# Patient Record
Sex: Female | Born: 1960 | Race: White | Hispanic: No | Marital: Married | Smoking: Former smoker
Health system: Southern US, Community
[De-identification: ages and names within clinical notes are randomized; demographics above are authoritative.]

## PROBLEM LIST (undated history)

## (undated) DIAGNOSIS — G9332 Myalgic encephalomyelitis/chronic fatigue syndrome: Secondary | ICD-10-CM

## (undated) DIAGNOSIS — M549 Dorsalgia, unspecified: Secondary | ICD-10-CM

## (undated) DIAGNOSIS — I2510 Atherosclerotic heart disease of native coronary artery without angina pectoris: Secondary | ICD-10-CM

## (undated) DIAGNOSIS — R519 Headache, unspecified: Secondary | ICD-10-CM

## (undated) DIAGNOSIS — M7989 Other specified soft tissue disorders: Secondary | ICD-10-CM

## (undated) DIAGNOSIS — R296 Repeated falls: Secondary | ICD-10-CM

## (undated) DIAGNOSIS — E559 Vitamin D deficiency, unspecified: Secondary | ICD-10-CM

## (undated) DIAGNOSIS — D649 Anemia, unspecified: Secondary | ICD-10-CM

## (undated) DIAGNOSIS — G4730 Sleep apnea, unspecified: Secondary | ICD-10-CM

## (undated) DIAGNOSIS — R0602 Shortness of breath: Secondary | ICD-10-CM

## (undated) DIAGNOSIS — F419 Anxiety disorder, unspecified: Secondary | ICD-10-CM

## (undated) DIAGNOSIS — Z9189 Other specified personal risk factors, not elsewhere classified: Secondary | ICD-10-CM

## (undated) DIAGNOSIS — K219 Gastro-esophageal reflux disease without esophagitis: Secondary | ICD-10-CM

## (undated) DIAGNOSIS — F32A Depression, unspecified: Secondary | ICD-10-CM

## (undated) DIAGNOSIS — R079 Chest pain, unspecified: Secondary | ICD-10-CM

## (undated) DIAGNOSIS — R7303 Prediabetes: Secondary | ICD-10-CM

## (undated) DIAGNOSIS — W19XXXA Unspecified fall, initial encounter: Secondary | ICD-10-CM

## (undated) DIAGNOSIS — E7800 Pure hypercholesterolemia, unspecified: Secondary | ICD-10-CM

## (undated) DIAGNOSIS — M1990 Unspecified osteoarthritis, unspecified site: Secondary | ICD-10-CM

## (undated) DIAGNOSIS — M797 Fibromyalgia: Secondary | ICD-10-CM

## (undated) DIAGNOSIS — J302 Other seasonal allergic rhinitis: Secondary | ICD-10-CM

## (undated) DIAGNOSIS — M069 Rheumatoid arthritis, unspecified: Secondary | ICD-10-CM

## (undated) DIAGNOSIS — M2550 Pain in unspecified joint: Secondary | ICD-10-CM

## (undated) DIAGNOSIS — R0600 Dyspnea, unspecified: Secondary | ICD-10-CM

## (undated) DIAGNOSIS — E039 Hypothyroidism, unspecified: Secondary | ICD-10-CM

## (undated) HISTORY — PX: COLONOSCOPY: SHX174

## (undated) HISTORY — DX: Vitamin D deficiency, unspecified: E55.9

## (undated) HISTORY — PX: MULTIPLE TOOTH EXTRACTIONS: SHX2053

## (undated) HISTORY — DX: Chest pain, unspecified: R07.9

## (undated) HISTORY — DX: Other specified soft tissue disorders: M79.89

## (undated) HISTORY — DX: Shortness of breath: R06.02

## (undated) HISTORY — DX: Pain in unspecified joint: M25.50

## (undated) HISTORY — DX: Pure hypercholesterolemia, unspecified: E78.00

## (undated) HISTORY — PX: CARDIAC CATHETERIZATION: SHX172

## (undated) HISTORY — DX: Dorsalgia, unspecified: M54.9

## (undated) HISTORY — DX: Myalgic encephalomyelitis/chronic fatigue syndrome: G93.32

## (undated) HISTORY — DX: Rheumatoid arthritis, unspecified: M06.9

---

## 2000-11-16 HISTORY — PX: BUNIONECTOMY: SHX129

## 2015-11-17 DIAGNOSIS — J449 Chronic obstructive pulmonary disease, unspecified: Secondary | ICD-10-CM

## 2015-11-17 HISTORY — DX: Chronic obstructive pulmonary disease, unspecified: J44.9

## 2015-11-17 HISTORY — PX: LAPAROSCOPIC GASTRIC SLEEVE RESECTION: SHX5895

## 2016-01-15 DIAGNOSIS — I219 Acute myocardial infarction, unspecified: Secondary | ICD-10-CM

## 2016-01-15 HISTORY — DX: Acute myocardial infarction, unspecified: I21.9

## 2016-01-23 HISTORY — PX: CARDIAC CATHETERIZATION: SHX172

## 2019-10-18 DIAGNOSIS — M25561 Pain in right knee: Secondary | ICD-10-CM | POA: Diagnosis not present

## 2019-10-18 DIAGNOSIS — Z1331 Encounter for screening for depression: Secondary | ICD-10-CM | POA: Diagnosis not present

## 2019-10-18 DIAGNOSIS — M25562 Pain in left knee: Secondary | ICD-10-CM | POA: Diagnosis not present

## 2019-10-18 DIAGNOSIS — Z0000 Encounter for general adult medical examination without abnormal findings: Secondary | ICD-10-CM | POA: Diagnosis not present

## 2019-10-18 DIAGNOSIS — G8929 Other chronic pain: Secondary | ICD-10-CM | POA: Diagnosis not present

## 2019-10-18 DIAGNOSIS — Z1159 Encounter for screening for other viral diseases: Secondary | ICD-10-CM | POA: Diagnosis not present

## 2019-10-18 DIAGNOSIS — R0602 Shortness of breath: Secondary | ICD-10-CM | POA: Diagnosis not present

## 2019-10-18 DIAGNOSIS — E7800 Pure hypercholesterolemia, unspecified: Secondary | ICD-10-CM | POA: Diagnosis not present

## 2019-10-18 DIAGNOSIS — Z114 Encounter for screening for human immunodeficiency virus [HIV]: Secondary | ICD-10-CM | POA: Diagnosis not present

## 2019-10-18 DIAGNOSIS — Z131 Encounter for screening for diabetes mellitus: Secondary | ICD-10-CM | POA: Diagnosis not present

## 2019-10-18 DIAGNOSIS — Z1339 Encounter for screening examination for other mental health and behavioral disorders: Secondary | ICD-10-CM | POA: Diagnosis not present

## 2019-10-18 DIAGNOSIS — M129 Arthropathy, unspecified: Secondary | ICD-10-CM | POA: Diagnosis not present

## 2019-10-18 DIAGNOSIS — Z23 Encounter for immunization: Secondary | ICD-10-CM | POA: Diagnosis not present

## 2019-10-18 DIAGNOSIS — M545 Low back pain: Secondary | ICD-10-CM | POA: Diagnosis not present

## 2019-10-18 DIAGNOSIS — E559 Vitamin D deficiency, unspecified: Secondary | ICD-10-CM | POA: Diagnosis not present

## 2019-10-18 DIAGNOSIS — Z79899 Other long term (current) drug therapy: Secondary | ICD-10-CM | POA: Diagnosis not present

## 2019-10-18 DIAGNOSIS — R5383 Other fatigue: Secondary | ICD-10-CM | POA: Diagnosis not present

## 2019-10-18 DIAGNOSIS — M542 Cervicalgia: Secondary | ICD-10-CM | POA: Diagnosis not present

## 2019-10-23 ENCOUNTER — Other Ambulatory Visit: Payer: Self-pay | Admitting: Nurse Practitioner

## 2019-10-23 DIAGNOSIS — Z1231 Encounter for screening mammogram for malignant neoplasm of breast: Secondary | ICD-10-CM

## 2019-10-23 DIAGNOSIS — B379 Candidiasis, unspecified: Secondary | ICD-10-CM | POA: Diagnosis not present

## 2019-10-23 DIAGNOSIS — T3695XA Adverse effect of unspecified systemic antibiotic, initial encounter: Secondary | ICD-10-CM | POA: Diagnosis not present

## 2019-10-23 DIAGNOSIS — R300 Dysuria: Secondary | ICD-10-CM | POA: Diagnosis not present

## 2019-10-23 DIAGNOSIS — N390 Urinary tract infection, site not specified: Secondary | ICD-10-CM | POA: Diagnosis not present

## 2019-10-27 DIAGNOSIS — M542 Cervicalgia: Secondary | ICD-10-CM | POA: Diagnosis not present

## 2019-10-27 DIAGNOSIS — M797 Fibromyalgia: Secondary | ICD-10-CM | POA: Diagnosis not present

## 2019-10-27 DIAGNOSIS — Z79899 Other long term (current) drug therapy: Secondary | ICD-10-CM | POA: Diagnosis not present

## 2019-10-27 DIAGNOSIS — G629 Polyneuropathy, unspecified: Secondary | ICD-10-CM | POA: Diagnosis not present

## 2019-10-27 DIAGNOSIS — M545 Low back pain: Secondary | ICD-10-CM | POA: Diagnosis not present

## 2019-10-30 DIAGNOSIS — J449 Chronic obstructive pulmonary disease, unspecified: Secondary | ICD-10-CM | POA: Diagnosis not present

## 2019-10-30 DIAGNOSIS — Z7952 Long term (current) use of systemic steroids: Secondary | ICD-10-CM | POA: Diagnosis not present

## 2019-10-30 DIAGNOSIS — R0602 Shortness of breath: Secondary | ICD-10-CM | POA: Diagnosis not present

## 2019-11-28 DIAGNOSIS — M25562 Pain in left knee: Secondary | ICD-10-CM | POA: Insufficient documentation

## 2019-11-28 DIAGNOSIS — M25561 Pain in right knee: Secondary | ICD-10-CM | POA: Insufficient documentation

## 2019-12-12 ENCOUNTER — Ambulatory Visit
Admission: RE | Admit: 2019-12-12 | Discharge: 2019-12-12 | Disposition: A | Discharge status: Home / Self Care | Payer: Self-pay | Source: Ambulatory Visit | Attending: Nurse Practitioner | Admitting: Nurse Practitioner

## 2019-12-12 ENCOUNTER — Other Ambulatory Visit: Payer: Self-pay

## 2019-12-12 DIAGNOSIS — Z1231 Encounter for screening mammogram for malignant neoplasm of breast: Secondary | ICD-10-CM

## 2020-03-01 DIAGNOSIS — N950 Postmenopausal bleeding: Secondary | ICD-10-CM | POA: Insufficient documentation

## 2020-03-12 ENCOUNTER — Encounter (HOSPITAL_COMMUNITY): Payer: Self-pay | Admitting: *Deleted

## 2020-03-12 NOTE — Progress Notes (Signed)
Received referral from Dr. Malen Gauze at [facility]  for this pt to participate in pulmonary rehab with the the diagnosis of dyspnea. Pt seen at the [facility] for pain management - back pain and fibromyalgia.  Clinical review of pt follow up appt on 4/16 with Shaaron Adler NP  office note. Pt completed spirometry in 10/2019.  Pt with Covid Risk Score - 2. Pt appropriate for scheduling for Pulmonary rehab.  Will forward  verification of insurance eligibility/benefits and pulmonary rehab staff for scheduling with pt consent. Alanson Aly, BSN Cardiac and Emergency planning/management officer

## 2020-03-13 ENCOUNTER — Telehealth (HOSPITAL_COMMUNITY): Payer: Self-pay

## 2020-03-13 NOTE — Telephone Encounter (Signed)
Pt insurance is active and benefits verified through [place] $10, DED 0/0 met, out of pocket $3,900/$51.16 met, co-insurance 0%. no pre-authorization required. Passport, 03/13/2020'@9' :43am, REF# [id number]

## 2020-03-21 ENCOUNTER — Telehealth (HOSPITAL_COMMUNITY): Payer: Self-pay | Admitting: *Deleted

## 2020-03-27 ENCOUNTER — Telehealth (HOSPITAL_COMMUNITY): Payer: Self-pay

## 2020-04-02 NOTE — Telephone Encounter (Signed)
Returned pt phone call in regards to PR, adv pt we have receive her referral. Explained scheduling process and went over insurance, patient verbalized understanding.   Pulmonary is scheduling out until June, referral has been pass to PR staff for scheduling.

## 2020-04-03 ENCOUNTER — Emergency Department (HOSPITAL_COMMUNITY): Payer: Medicare HMO

## 2020-04-03 ENCOUNTER — Encounter (HOSPITAL_COMMUNITY): Payer: Self-pay | Admitting: Emergency Medicine

## 2020-04-03 ENCOUNTER — Other Ambulatory Visit: Payer: Self-pay

## 2020-04-03 ENCOUNTER — Emergency Department (HOSPITAL_COMMUNITY)
Admission: EM | Admit: 2020-04-03 | Discharge: 2020-04-04 | Disposition: A | Discharge status: Home / Self Care | Payer: Medicare HMO | Attending: Emergency Medicine | Admitting: Emergency Medicine

## 2020-04-03 DIAGNOSIS — R55 Syncope and collapse: Secondary | ICD-10-CM | POA: Insufficient documentation

## 2020-04-03 DIAGNOSIS — Y999 Unspecified external cause status: Secondary | ICD-10-CM | POA: Diagnosis not present

## 2020-04-03 DIAGNOSIS — W1830XA Fall on same level, unspecified, initial encounter: Secondary | ICD-10-CM | POA: Insufficient documentation

## 2020-04-03 DIAGNOSIS — Y929 Unspecified place or not applicable: Secondary | ICD-10-CM | POA: Insufficient documentation

## 2020-04-03 DIAGNOSIS — B370 Candidal stomatitis: Secondary | ICD-10-CM | POA: Diagnosis not present

## 2020-04-03 DIAGNOSIS — I2510 Atherosclerotic heart disease of native coronary artery without angina pectoris: Secondary | ICD-10-CM | POA: Insufficient documentation

## 2020-04-03 DIAGNOSIS — Y939 Activity, unspecified: Secondary | ICD-10-CM | POA: Diagnosis not present

## 2020-04-03 DIAGNOSIS — S0990XA Unspecified injury of head, initial encounter: Secondary | ICD-10-CM

## 2020-04-03 HISTORY — DX: Fibromyalgia: M79.7

## 2020-04-03 HISTORY — DX: Atherosclerotic heart disease of native coronary artery without angina pectoris: I25.10

## 2020-04-03 LAB — BASIC METABOLIC PANEL
Anion gap: 9 (ref 5–15)
BUN: 18 mg/dL (ref 8–23)
CO2: 28 mmol/L (ref 22–32)
Calcium: 9.4 mg/dL (ref 8.9–10.3)
Chloride: 102 mmol/L (ref 98–111)
Creatinine, Ser: 0.99 mg/dL (ref 0.44–1.00)
GFR calc Af Amer: >60 mL/min (ref >60)
GFR calc non Af Amer: >60 mL/min (ref >60)
Glucose, Bld: 101 mg/dL — ABNORMAL HIGH (ref 70–99)
Potassium: 3.7 mmol/L (ref 3.5–5.1)
Sodium: 139 mmol/L (ref 135–145)

## 2020-04-03 LAB — CBC
HCT: 41.6 % (ref 36.0–46.0)
Hemoglobin: 12.8 g/dL (ref 12.0–15.0)
MCH: 29.2 pg (ref 26.0–34.0)
MCHC: 30.8 g/dL (ref 30.0–36.0)
MCV: 95 fL (ref 80.0–100.0)
Platelets: 209 10*3/uL (ref 150–400)
RBC: 4.38 MIL/uL (ref 3.87–5.11)
RDW: 12.4 % (ref 11.5–15.5)
WBC: 5.5 10*3/uL (ref 4.0–10.5)
nRBC: 0 % (ref 0.0–0.2)

## 2020-04-03 LAB — TROPONIN I (HIGH SENSITIVITY): Troponin I (High Sensitivity): <2 ng/L (ref <18)

## 2020-04-03 LAB — CBG MONITORING, ED: Glucose-Capillary: 94 mg/dL (ref 70–99)

## 2020-04-03 MED ORDER — SODIUM CHLORIDE 0.9% FLUSH
3.0000 mL | Freq: Once | INTRAVENOUS | Status: AC
  Administered 2020-04-03: 3 mL via INTRAVENOUS

## 2020-04-03 MED ORDER — SODIUM CHLORIDE 0.9 % IV BOLUS
500.0000 mL | Freq: Once | INTRAVENOUS | Status: AC
  Administered 2020-04-03: 500 mL via INTRAVENOUS

## 2020-04-03 MED ORDER — NYSTATIN 100000 UNIT/ML MT SUSP: 500000.0000 [IU] | Freq: Four times a day (QID) | OROMUCOSAL | 0 refills | Status: AC

## 2020-04-03 MED ORDER — NYSTATIN 100000 UNIT/ML MT SUSP
500000.0000 [IU] | Freq: Four times a day (QID) | OROMUCOSAL | 0 refills | Status: DC
Start: 2020-04-03 — End: 2020-04-03

## 2020-04-03 NOTE — Discharge Instructions (Signed)
You have been seen today in the Emergency Department (ED)  for syncope (passing out).  Your workup including labs and EKG show reassuring results.  Your symptoms may be due to dehydration, so it is important that you drink plenty of non-alcoholic fluids.  I have called in a prescription for your thrush.  You can begin taking that as directed and follow with your primary care doctor.  Please call your regular doctor as soon as possible to schedule the next available clinic appointment to follow up with him/her regarding your visit to the ED and your symptoms.  Return to the Emergency Department (ED)  if you have any further syncopal episodes (pass out again) or develop ANY chest pain, pressure, tightness, trouble breathing, sudden sweating, or other symptoms that concern you.

## 2020-04-03 NOTE — ED Provider Notes (Signed)
Emergency Department Provider Note   I have reviewed the triage vital signs and the nursing notes.   HISTORY  Chief Complaint Loss of Consciousness   HPI Angela Mahoney is a 59 y.o. female with PMH of CAD, hypotension, syncope, Fibromyalgia, and prior falls presents to the emergency department for evaluation after fall with an apparent syncopal event.  Patient states that she was feeling fine today and got up to make her husband a sandwich.  She made it to the refrigerator without feeling lightheaded, open the door, and suddenly felt very lightheaded.  She did not experience chest pain or heart palpitations.  She does not remember falling but awoke on the floor.  The patient's husband, at bedside, states that he heard a fall and went in to find the patient awake and alert but lying on the ground.  He placed a pillow under her head because she was complaining of some headache and called EMS after speaking with her daughter.   Patient denies any pain in the arms or legs.  She tells me this is happened to her multiple times before.  She recently completed her Covid vaccine series and felt flulike symptoms for several days afterwards.  Shortly after that she developed thrush which she has been treating with over-the-counter medications.  Because of this, she has not been eating or drinking as much as she typically does and thinks she may be dehydrated.  She not experiencing fevers or shaking chills. No radiation of symptoms or modifying factors. EMS report BP in the 90s systolic and gave IVF en route (500 mL) with improvement in pressures.   She does state that she been feeling somewhat short of breath recently but not worse since falling.  She denies any wheezing.  She states she does feel short of breath from time to time and has COPD history.   Past Medical History:  Diagnosis Date  . Coronary artery disease   . Fibromyalgia     There are no problems to display for this  patient.   History reviewed. No pertinent surgical history.  Allergies Zolpidem  History reviewed. No pertinent family history.  Social History Social History   Tobacco Use  . Smoking status: Never Smoker  . Smokeless tobacco: Never Used  Substance Use Topics  . Alcohol use: Never  . Drug use: Never    Review of Systems  Constitutional: No fever/chills. Eyes: No visual changes. ENT: No sore throat. Positive thrush.  Cardiovascular: Denies chest pain. Positive syncope and fall.  Respiratory: Positive shortness of breath. Gastrointestinal: No abdominal pain.  No nausea, no vomiting.  No diarrhea.  No constipation. Genitourinary: Negative for dysuria. Musculoskeletal: Negative for back pain. Skin: Negative for rash. Neurological: Negative for focal weakness or numbness. Positive HA.   10-point ROS otherwise negative.  ____________________________________________   PHYSICAL EXAM:  VITAL SIGNS: ED Triage Vitals  Enc Vitals Group     BP 04/03/20 2200 (!) 110/97     Pulse Rate 04/03/20 2200 65     Resp 04/03/20 2200 17     Temp 04/03/20 2200 98.3 F (36.8 C)     Temp Source 04/03/20 2200 Oral     SpO2 04/03/20 2200 99 %     Weight 04/03/20 2156 223 lb (101.2 kg)     Height 04/03/20 2156 5\' 4"  (1.626 m)   Constitutional: Alert and oriented. Well appearing and in no acute distress. Eyes: Conjunctivae are normal. PERRL.  Head: 2 cm occipital scalp hematoma  without laceration.  Nose: No congestion/rhinnorhea. Mouth/Throat: Mucous membranes are moist.  Oropharynx non-erythematous. Mild oral thrush noted.  Neck: No stridor. C collar in place. No midline tenderness.  Cardiovascular: Normal rate, regular rhythm. Good peripheral circulation. Grossly normal heart sounds.   Respiratory: Normal respiratory effort.  No retractions. Lungs CTAB. Gastrointestinal: Soft and nontender. No distention.  Musculoskeletal: No lower extremity tenderness nor edema. No gross deformities  of extremities.  Normal active and passive range of motion of the bilateral upper and lower extremities.  Neurologic:  Normal speech and language. No gross focal neurologic deficits are appreciated.  No facial asymmetry.  Equal grips bilaterally.  Normal strength in the bilateral upper and lower extremities.  Skin:  Skin is warm, dry and intact. No rash noted.   ____________________________________________   LABS (all labs ordered are listed, but only abnormal results are displayed)  Labs Reviewed  BASIC METABOLIC PANEL - Abnormal; Notable for the following components:      Result Value   Glucose, Bld 101 (*)    All other components within normal limits  CBC  CBG MONITORING, ED  TROPONIN I (HIGH SENSITIVITY)  TROPONIN I (HIGH SENSITIVITY)   ____________________________________________  EKG  Rate: 63 PR: 203 QTc: 438  Sinus rhythm. Narrow QRS. Normal T waves. No STEMI.  ____________________________________________  RADIOLOGY  CT Head Wo Contrast  Result Date: 04/03/2020 CLINICAL DATA:  Syncopal episode with subsequent fall. EXAM: CT HEAD WITHOUT CONTRAST TECHNIQUE: Contiguous axial images were obtained from the base of the skull through the vertex without intravenous contrast. COMPARISON:  None. FINDINGS: Brain: No evidence of acute infarction, hemorrhage, hydrocephalus, extra-axial collection or mass lesion/mass effect. Vascular: No hyperdense vessel or unexpected calcification. Skull: Normal. Negative for fracture or focal lesion. Sinuses/Orbits: No acute finding. Other: None. IMPRESSION: No acute intracranial pathology. Electronically Signed   By: Aram Candela M.D.   On: 04/03/2020 22:50   CT Cervical Spine Wo Contrast  Result Date: 04/03/2020 CLINICAL DATA:  Syncopal episode with subsequent fall. EXAM: CT CERVICAL SPINE WITHOUT CONTRAST TECHNIQUE: Multidetector CT imaging of the cervical spine was performed without intravenous contrast. Multiplanar CT image  reconstructions were also generated. COMPARISON:  None. FINDINGS: Alignment: There is approximately 2 mm anterolisthesis of the C4 vertebral body on C5. Skull base and vertebrae: No acute fracture. No primary bone lesion or focal pathologic process. Soft tissues and spinal canal: No prevertebral fluid or swelling. No visible canal hematoma. Disc levels: Moderate severity endplate sclerosis is seen at the levels of C5-C6, C6-C7 and C7-T1. Mild intervertebral disc space narrowing is also seen at these levels. Marked severity bilateral multilevel facet joint hypertrophy is seen. Upper chest: Negative. Other: None. IMPRESSION: 1. No acute osseous abnormality. 2. Marked severity bilateral multilevel facet joint hypertrophy. 3. 2 mm anterolisthesis of the C4 vertebral body on C5. Electronically Signed   By: Aram Candela M.D.   On: 04/03/2020 22:51   DG Chest Portable 1 View  Result Date: 04/03/2020 CLINICAL DATA:  Syncope EXAM: PORTABLE CHEST 1 VIEW COMPARISON:  None. FINDINGS: The heart size and mediastinal contours are within normal limits. Both lungs are clear. The visualized skeletal structures are unremarkable. IMPRESSION: No active disease. Electronically Signed   By: Sharlet Salina M.D.   On: 04/03/2020 23:30    ____________________________________________   PROCEDURES  Procedure(s) performed:   Procedures  None ____________________________________________   INITIAL IMPRESSION / ASSESSMENT AND PLAN / ED COURSE  Pertinent labs & imaging results that were available during my care of  the patient were reviewed by me and considered in my medical decision making (see chart for details).   Patient presents emergency department evaluation of syncope today.  She has had multiple episodes like this in the past.  Blood pressure low on scene but improved with IV fluids.  Patient had flulike symptoms after Covid vaccine and then developed thrush and so p.o. intake has been decreased.  She is  well-appearing with normal vitals after IV fluids.  No fever.  Normal neurologic exam.  She is very small hematoma to the posterior scalp without laceration.  Some pain with range of motion of her neck on scene with EMS so c-collar in place but no midline tenderness on exam.  Plan for additional IV fluids.  Given the ACS history will obtain screening blood work along with troponin.  EKG is reassuring.  Plan for CT imaging of the head and cervical spine.  Patient with COPD and having some mild shortness of breath but no acute distress.  Will obtain screening chest x-ray.   Patient signed out to Dr. Randal Buba pending repeat troponin.  ____________________________________________  FINAL CLINICAL IMPRESSION(S) / ED DIAGNOSES  Final diagnoses:  Syncope and collapse  Injury of head, initial encounter  Thrush, oral     MEDICATIONS GIVEN DURING THIS VISIT:  Medications  sodium chloride flush (NS) 0.9 % injection 3 mL (3 mLs Intravenous Given 04/03/20 2210)  sodium chloride 0.9 % bolus 500 mL (0 mLs Intravenous Stopped 04/04/20 0130)     Note:  This document was prepared using Dragon voice recognition software and may include unintentional dictation errors.  Nanda Quinton, MD, [facility] Emergency Medicine    Marshall Kampf, Wonda Old[facility], MD 04/04/20 [phone]

## 2020-04-03 NOTE — ED Triage Notes (Signed)
Pt brought to ED by [facility] from home after pt had a syncope episode at home fell and hit her head. c collar in place by EMS, pt states her normal BP is 90/60 on EMS arrival pt's BP 120/80 while lying down and 90/60 while sitting up. 500 mL NS bolus given by EMS pta. BP 114/58, HR 60, R 18, SPO2 98% RA.

## 2020-04-04 LAB — TROPONIN I (HIGH SENSITIVITY): Troponin I (High Sensitivity): <2 ng/L (ref <18)

## 2020-04-04 NOTE — ED Notes (Signed)
Pt verbalized understanding of discharge instructions. Follow up care and prescriptions reviewed, pt had no further questions. 

## 2020-04-10 ENCOUNTER — Telehealth (HOSPITAL_COMMUNITY): Payer: Self-pay

## 2020-04-22 ENCOUNTER — Ambulatory Visit (HOSPITAL_COMMUNITY): Payer: Medicare HMO

## 2020-04-22 ENCOUNTER — Telehealth (HOSPITAL_COMMUNITY): Payer: Self-pay | Admitting: *Deleted

## 2020-04-22 NOTE — Telephone Encounter (Signed)
Pt called to confirm her appt with pulmonary rehab staff for orientation appt on this coming Wednesday.  Advised pt that she does have an appt on June 9th at 9:00. Aware that she will need to arrive by 8:45.  Reviewed our location and directions.  Pt thanked me for the call. Alanson Aly, BSN Cardiac and Emergency planning/management officer

## 2020-04-23 ENCOUNTER — Telehealth (HOSPITAL_COMMUNITY): Payer: Self-pay | Admitting: *Deleted

## 2020-04-23 NOTE — Telephone Encounter (Signed)
Called to remind patient of appointment in pulmonary rehab 04/24/20 @ 0900 for orientation/walk test.  Patient confirmed.

## 2020-04-24 ENCOUNTER — Other Ambulatory Visit: Payer: Self-pay

## 2020-04-24 ENCOUNTER — Encounter (HOSPITAL_COMMUNITY)
Admission: RE | Admit: 2020-04-24 | Discharge: 2020-04-24 | Disposition: A | Discharge status: Home / Self Care | Payer: Medicare HMO | Source: Ambulatory Visit | Attending: Cardiology | Admitting: Cardiology

## 2020-04-24 ENCOUNTER — Encounter (HOSPITAL_COMMUNITY): Payer: Self-pay

## 2020-04-24 VITALS — BP 104/70 | HR 74 | Ht 64.0 in | Wt 222.9 lb

## 2020-04-24 DIAGNOSIS — R0600 Dyspnea, unspecified: Secondary | ICD-10-CM | POA: Insufficient documentation

## 2020-04-24 NOTE — Progress Notes (Signed)
Angela Mahoney 59 y.o. female Pulmonary Rehab Orientation Note Patient arrived today in Cardiac and Pulmonary Rehab for orientation to Pulmonary Rehab. She walked from the [facility] parking deck with moderate shortness of breath. She does not carry portable oxygen. Per pt, she uses oxygen never, but wears a CPAP @ night.  Color good, skin warm and dry. Patient is oriented to time and place. Patient's medical history, psychosocial health, and medications reviewed. Psychosocial assessment reveals pt lives with their spouse. Pt is currently unemployed.  Pt does exhibit signs of depression and is currently utilizing a therapist every 10 days, sees a psychiatrist monthly and is on medications for depression.  Ambilify was just added to help with insomnia. PHQ2/9 score 4/13. Pt shows fair  coping skills with positive outlook .  offered emotional support and reassurance. Will continue to monitor and evaluate progress toward psychosocial goal(s) of improved mental well being while participating in pulmonary rehab. Physical assessment reveals heart rate is normal, breath sounds clear to auscultation, no wheezes, rales, or rhonchi. Grip strength equal, strong. Patient reports she does take medications as prescribed. Patient states she follows a Regular diet. She had gastric sleeve surgery in 2018 and has lost 70 pounds since then.  She eats 6 small meals throughout the day to accomodate her small stomach and takes vitamin supplement as well... Patient's weight will be monitored closely. Demonstration and practice of PLB using pulse oximeter. Patient able to return demonstration satisfactorily. Safety and hand hygiene in the exercise area reviewed with patient. Patient voices understanding of the information reviewed. Department expectations discussed with patient and achievable goals were set. The patient shows enthusiasm about attending the program and we look forward to working with this nice lady. The patient did not  complete the 6 minute walk test today due to wearing flip flops and she has a history of falls.  This has been rescheduled for 04/30/2020 @ 1015 and the she will begin exercising on 05/02/2020 in the 1015 exercise slot. [id number]

## 2020-04-30 ENCOUNTER — Encounter (HOSPITAL_COMMUNITY)
Admission: RE | Admit: 2020-04-30 | Discharge: 2020-04-30 | Disposition: A | Discharge status: Home / Self Care | Payer: Medicare HMO | Source: Ambulatory Visit | Attending: Cardiology | Admitting: Cardiology

## 2020-04-30 ENCOUNTER — Other Ambulatory Visit: Payer: Self-pay

## 2020-04-30 DIAGNOSIS — R0600 Dyspnea, unspecified: Secondary | ICD-10-CM | POA: Diagnosis not present

## 2020-04-30 NOTE — Progress Notes (Signed)
Pulmonary Individual Treatment Plan  Patient Details  Name: Angela Mahoney MRN: [id number] Date of Birth: May 31, 1961 Referring Provider:     Pulmonary Rehab Walk Test from 04/30/2020 in [place]  Referring Provider Dr. Royce Macadamia      Initial Encounter Date:    Pulmonary Rehab Walk Test from 04/30/2020 in [place]  Date 04/30/20      Visit Diagnosis: Dyspnea, unspecified type  Patient's Home Medications on Admission:   Current Outpatient Medications:  .  acetaminophen (TYLENOL) 325 MG tablet, Take 650 mg by mouth 2 (two) times daily as needed for mild pain or headache., Disp: , Rfl:  .  albuterol (VENTOLIN HFA) 108 (90 Base) MCG/ACT inhaler, Inhale 1-2 puffs into the lungs every 6 (six) hours as needed for shortness of breath or wheezing., Disp: , Rfl:  .  ARIPiprazole (ABILIFY) 5 MG tablet, Take 5 mg by mouth every morning., Disp: , Rfl:  .  ascorbic acid (VITAMIN C) 500 MG tablet, Take 500 mg by mouth daily., Disp: , Rfl:  .  aspirin EC 81 MG tablet, Take 81 mg by mouth daily., Disp: , Rfl:  .  atorvastatin (LIPITOR) 80 MG tablet, Take 1 tablet by mouth daily., Disp: , Rfl:  .  baclofen (LIORESAL) 10 MG tablet, Take 1 tablet by mouth daily., Disp: , Rfl:  .  busPIRone (BUSPAR) 30 MG tablet, Take 1 tablet by mouth 3 (three) times daily., Disp: , Rfl:  .  Calcium Citrate-Vitamin D (CALCIUM CITRATE + D PO), Take 1 tablet by mouth daily., Disp: , Rfl:  .  DULoxetine (CYMBALTA) 60 MG capsule, Take 2 capsules by mouth at bedtime., Disp: , Rfl:  .  ergocalciferol (VITAMIN D2) 1.25 MG (50000 UT) capsule, Take 50,000 Units by mouth every Sunday., Disp: , Rfl:  .  famotidine (PEPCID) 20 MG tablet, Take 1 tablet by mouth daily., Disp: , Rfl:  .  ferrous sulfate 325 (65 FE) MG tablet, Take 325 mg by mouth 2 (two) times a week., Disp: , Rfl:  .  fluticasone (FLONASE) 50 MCG/ACT nasal spray, Place 1 spray into both nostrils  daily as needed for allergies or rhinitis., Disp: , Rfl:  .  folic acid (FOLVITE) 568 MCG tablet, Take 800 mcg by mouth daily., Disp: , Rfl:  .  gabapentin (NEURONTIN) 300 MG capsule, Take 600-900 mg by mouth See admin instructions. Take 2 capsules in the morning and dinner time, then take 3 capsules at bedtime, Disp: , Rfl:  .  levothyroxine (SYNTHROID) 25 MCG tablet, Take 1 tablet by mouth daily., Disp: , Rfl:  .  metoprolol tartrate (LOPRESSOR) 50 MG tablet, Take 25 mg by mouth daily., Disp: , Rfl:  .  Multiple Vitamin (MULTIVITAMIN WITH MINERALS) TABS tablet, Take 1 tablet by mouth daily., Disp: , Rfl:  .  NARCAN 4 MG/0.1ML LIQD nasal spray kit, Place 1 spray into the nose as needed for opioid reversal., Disp: , Rfl:  .  nitroGLYCERIN (NITROSTAT) 0.4 MG SL tablet, Place 0.4 mg under the tongue every 5 (five) minutes as needed for chest pain., Disp: , Rfl:  .  ondansetron (ZOFRAN-ODT) 4 MG disintegrating tablet, Take 1 tablet by mouth every 8 (eight) hours as needed for nausea/vomiting., Disp: , Rfl:  .  ranolazine (RANEXA) 500 MG 12 hr tablet, Take 1 tablet by mouth daily., Disp: , Rfl:   Past Medical History: Past Medical History:  Diagnosis Date  . COPD (chronic obstructive pulmonary  disease) ([place]) 2017  . Coronary artery disease   . Fibromyalgia     Tobacco Use: Social History   Tobacco Use  Smoking Status Former Smoker  . Packs/day: 0.50  . Years: 45.00  . Pack years: 22.50  . Types: Cigarettes  Smokeless Tobacco Never Used    Labs: Recent Review Flowsheet Data   There is no flowsheet data to display.     Capillary Blood Glucose: Lab Results  Component Value Date   GLUCAP 94 04/03/2020     Pulmonary Assessment Scores:  Pulmonary Assessment Scores    Row Name 04/24/20 1258         ADL UCSD   ADL Phase Entry     SOB Score total 73       CAT Score   CAT Score 24       mMRC Score   mMRC Score 4           UCSD: Self-administered rating of dyspnea  associated with activities of daily living (ADLs) 6-point scale (0 = "not at all" to 5 = "maximal or unable to do because of breathlessness")  Scoring Scores range from 0 to 120.  Minimally important difference is 5 units  CAT: CAT can identify the health impairment of COPD patients and is better correlated with disease progression.  CAT has a scoring range of zero to 40. The CAT score is classified into four groups of low (less than 10), medium (10 - 20), high (21-30) and very high (31-40) based on the impact level of disease on health status. A CAT score over 10 suggests significant symptoms.  A worsening CAT score could be explained by an exacerbation, poor medication adherence, poor inhaler technique, or progression of COPD or comorbid conditions.  CAT MCID is 2 points  mMRC: mMRC (Modified Medical Research Council) Dyspnea Scale is used to assess the degree of baseline functional disability in patients of respiratory disease due to dyspnea. No minimal important difference is established. A decrease in score of 1 point or greater is considered a positive change.   Pulmonary Function Assessment:  Pulmonary Function Assessment - 04/24/20 1259      Breath   Bilateral Breath Sounds Clear    Shortness of Breath Limiting activity;Yes           Exercise Target Goals: Exercise Program Goal: Individual exercise prescription set using results from initial 6 min walk test and THRR while considering  patient's activity barriers and safety.   Exercise Prescription Goal: Initial exercise prescription builds to 30-45 minutes a day of aerobic activity, 2-3 days per week.  Home exercise guidelines will be given to patient during program as part of exercise prescription that the participant will acknowledge.  Activity Barriers & Risk Stratification:  Activity Barriers & Cardiac Risk Stratification - 04/24/20 0941      Activity Barriers & Cardiac Risk Stratification   Activity Barriers  Fibromyalgia;History of Falls;Muscular [employer] Device;Shortness of Breath;Deconditioning           6 Minute Walk:  6 Minute Walk    Row Name 04/30/20 1211         6 Minute Walk   Phase Initial     Distance 827 feet     Walk Time 6 minutes     # of Rest Breaks 0     MPH 1.57     METS 1.78     RPE 13     Perceived Dyspnea  2     VO2  Peak 6.23     Symptoms Yes (comment)     Comments Right hip and knee pain 9/10     Resting HR 88 bpm     Resting BP 108/60     Resting Oxygen Saturation  97 %     Exercise Oxygen Saturation  during 6 min walk 94 %     Max Ex. HR 94 bpm     Max Ex. BP 116/64     2 Minute Post BP 98/62       Interval HR   1 Minute HR 88     2 Minute HR 94     3 Minute HR 94     4 Minute HR 93     5 Minute HR 89     6 Minute HR 94     2 Minute Post HR 80     Interval Heart Rate? Yes       Interval Oxygen   Interval Oxygen? Yes     Baseline Oxygen Saturation % 97 %     1 Minute Oxygen Saturation % 97 %     1 Minute Liters of Oxygen 0 L     2 Minute Oxygen Saturation % 94 %     2 Minute Liters of Oxygen 0 L     3 Minute Oxygen Saturation % 97 %     3 Minute Liters of Oxygen 0 L     4 Minute Oxygen Saturation % 95 %     4 Minute Liters of Oxygen 0 L     5 Minute Oxygen Saturation % 97 %     5 Minute Liters of Oxygen 0 L     6 Minute Oxygen Saturation % 96 %     6 Minute Liters of Oxygen 0 L     2 Minute Post Oxygen Saturation % 98 %     2 Minute Post Liters of Oxygen 0 L            Oxygen Initial Assessment:  Oxygen Initial Assessment - 04/30/20 1204      Home Oxygen   Home Oxygen Device None    Sleep Oxygen Prescription CPAP    Liters per minute 0    Home Exercise Oxygen Prescription None    Home at Rest Exercise Oxygen Prescription None    Compliance with Home Oxygen Use Yes      Initial 6 min Walk   Oxygen Used None      Program Oxygen Prescription   Program Oxygen Prescription None      Intervention   Short Term  Goals To learn and exhibit compliance with exercise, home and travel O2 prescription;To learn and understand importance of monitoring SPO2 with pulse oximeter and demonstrate accurate use of the pulse oximeter.;To learn and understand importance of maintaining oxygen saturations>88%;To learn and demonstrate proper pursed lip breathing techniques or other breathing techniques.;To learn and demonstrate proper use of respiratory medications    Long  Term Goals Exhibits compliance with exercise, home and travel O2 prescription;Verbalizes importance of monitoring SPO2 with pulse oximeter and return demonstration;Maintenance of O2 saturations>88%;Exhibits proper breathing techniques, such as pursed lip breathing or other method taught during program session;Compliance with respiratory medication;Demonstrates proper use of MDI's           Oxygen Re-Evaluation:   Oxygen Discharge (Final Oxygen Re-Evaluation):   Initial Exercise Prescription:  Initial Exercise Prescription - 04/30/20 1200      Date of Initial Exercise RX  and Referring Provider   Date 04/30/20    Referring Provider Dr. Royce Macadamia      NuStep   Level 2    SPM 80    Minutes 15      Arm Ergometer   Level 1    RPM 60    Minutes 15      Prescription Details   Frequency (times per week) 2    Duration Progress to 30 minutes of continuous aerobic without signs/symptoms of physical distress      Intensity   THRR 40-80% of Max Heartrate 64-127    Ratings of Perceived Exertion 11-13    Perceived Dyspnea 0-4      Resistance Training   Training Prescription Yes    Weight orange band    Reps 10-15           Perform Capillary Blood Glucose checks as needed.  Exercise Prescription Changes:   Exercise Comments:   Exercise Goals and Review:  Exercise Goals    Row Name 04/30/20 1216             Exercise Goals   Increase Physical Activity Yes       Intervention Provide advice, education, support and counseling about  physical activity/exercise needs.;Develop an individualized exercise prescription for aerobic and resistive training based on initial evaluation findings, risk stratification, comorbidities and participant's personal goals.       Expected Outcomes Short Term: Attend rehab on a regular basis to increase amount of physical activity.;Long Term: Add in home exercise to make exercise part of routine and to increase amount of physical activity.;Long Term: Exercising regularly at least 3-5 days a week.       Increase Strength and Stamina Yes       Intervention Provide advice, education, support and counseling about physical activity/exercise needs.;Develop an individualized exercise prescription for aerobic and resistive training based on initial evaluation findings, risk stratification, comorbidities and participant's personal goals.       Expected Outcomes Short Term: Increase workloads from initial exercise prescription for resistance, speed, and METs.;Short Term: Perform resistance training exercises routinely during rehab and add in resistance training at home;Long Term: Improve cardiorespiratory fitness, muscular endurance and strength as measured by increased METs and functional capacity (6MWT)       Able to understand and use rate of perceived exertion (RPE) scale Yes       Intervention Provide education and explanation on how to use RPE scale       Expected Outcomes Short Term: Able to use RPE daily in rehab to express subjective intensity level;Long Term:  Able to use RPE to guide intensity level when exercising independently       Able to understand and use Dyspnea scale Yes       Intervention Provide education and explanation on how to use Dyspnea scale       Expected Outcomes Short Term: Able to use Dyspnea scale daily in rehab to express subjective sense of shortness of breath during exertion;Long Term: Able to use Dyspnea scale to guide intensity level when exercising independently       Knowledge  and understanding of Target Heart Rate Range (THRR) Yes       Intervention Provide education and explanation of THRR including how the numbers were predicted and where they are located for reference       Expected Outcomes Short Term: Able to state/look up THRR;Short Term: Able to use daily as guideline for intensity in rehab;Long Term: Able  to use THRR to govern intensity when exercising independently       Understanding of Exercise Prescription Yes       Intervention Provide education, explanation, and written materials on patient's individual exercise prescription       Expected Outcomes Short Term: Able to explain program exercise prescription;Long Term: Able to explain home exercise prescription to exercise independently              Exercise Goals Re-Evaluation :   Discharge Exercise Prescription (Final Exercise Prescription Changes):   Nutrition:  Target Goals: Understanding of nutrition guidelines, daily intake of sodium <158m, cholesterol <2015m calories 30% from fat and 7% or less from saturated fats, daily to have 5 or more servings of fruits and vegetables.  Biometrics:  Pre Biometrics - 04/24/20 0942      Pre Biometrics   Grip Strength 12 kg            Nutrition Therapy Plan and Nutrition Goals:   Nutrition Assessments:   Nutrition Goals Re-Evaluation:   Nutrition Goals Discharge (Final Nutrition Goals Re-Evaluation):   Psychosocial: Target Goals: Acknowledge presence or absence of significant depression and/or stress, maximize coping skills, provide positive support system. Participant is able to verbalize types and ability to use techniques and skills needed for reducing stress and depression.  Initial Review & Psychosocial Screening:  Initial Psych Review & Screening - 04/24/20 1300      Initial Review   Current issues with Current Depression;History of Depression;Current Anxiety/Panic;Current Psychotropic Meds;Current Sleep Concerns   Is currently  seeing a therapist every 10 days, a psychiatrist monthly, was just started on ambilify to help with her insomnia     FaGalaxYes      Barriers   Psychosocial barriers to participate in program The patient should benefit from training in stress management and relaxation.      Screening Interventions   Interventions Encouraged to exercise    Expected Outcomes Short Term goal: Utilizing psychosocial counselor, staff and physician to assist with identification of specific Stressors or current issues interfering with healing process. Setting desired goal for each stressor or current issue identified.;Long Term Goal: Stressors or current issues are controlled or eliminated.;Short Term goal: Identification and review with participant of any Quality of Life or Depression concerns found by scoring the questionnaire.;Long Term goal: The participant improves quality of Life and PHQ9 Scores as seen by post scores and/or verbalization of changes           Quality of Life Scores:  Scores of 19 and below usually indicate a poorer quality of life in these areas.  A difference of  2-3 points is a clinically meaningful difference.  A difference of 2-3 points in the total score of the Quality of Life Index has been associated with significant improvement in overall quality of life, self-image, physical symptoms, and general health in studies assessing change in quality of life.  PHQ-9: Recent Review Flowsheet Data    [facility] 04/24/2020   Decreased Interest 3   Down, Depressed, Hopeless 1   PHQ - 2 Score 4   Altered sleeping 3   Tired, decreased energy 3   Change in appetite 3   Feeling bad or failure about yourself  1   Trouble concentrating 3   Moving slowly or fidgety/restless 0   Suicidal thoughts 0   Difficult doing work/chores Not difficult at all     Interpretation of Total Score  Total Score Depression Severity:  1-4 = Minimal depression, 5-9 =  Mild depression, 10-14 = Moderate depression, 15-19 = Moderately severe depression, 20-27 = Severe depression   Psychosocial Evaluation and Intervention:  Psychosocial Evaluation - 04/24/20 1303      Psychosocial Evaluation & Interventions   Interventions Stress management education;Encouraged to exercise with the program and follow exercise prescription;Relaxation education    Comments Current therapy, medications are keeping her depression stable    Expected Outcomes For patient to continue with mental well-being while participating in pulmonary rehab.    Continue Psychosocial Services  No Follow up required           Psychosocial Re-Evaluation:   Psychosocial Discharge (Final Psychosocial Re-Evaluation):   Education: Education Goals: Education classes will be provided on a weekly basis, covering required topics. Participant will state understanding/return demonstration of topics presented.  Learning Barriers/Preferences:  Learning Barriers/Preferences - 04/24/20 1305      Learning Barriers/Preferences   Learning Barriers None    Learning Preferences Audio;Computer/Internet;Group Instruction;Individual Instruction;Pictoral;Skilled Demonstration;Verbal Instruction;Video;Written Material           Education Topics: Risk Factor Reduction:  -Group instruction that is supported by a PowerPoint presentation. Instructor discusses the definition of a risk factor, different risk factors for pulmonary disease, and how the heart and lungs work together.     Nutrition for Pulmonary Patient:  -Group instruction provided by PowerPoint slides, verbal discussion, and written materials to support subject matter. The instructor gives an explanation and review of healthy diet recommendations, which includes a discussion on weight management, recommendations for fruit and vegetable consumption, as well as protein, fluid, caffeine, fiber, sodium, sugar, and alcohol. Tips for eating when  patients are short of breath are discussed.   Pursed Lip Breathing:  -Group instruction that is supported by demonstration and informational handouts. Instructor discusses the benefits of pursed lip and diaphragmatic breathing and detailed demonstration on how to preform both.     Oxygen Safety:  -Group instruction provided by PowerPoint, verbal discussion, and written material to support subject matter. There is an overview of "What is Oxygen" and "Why do we need it".  Instructor also reviews how to create a safe environment for oxygen use, the importance of using oxygen as prescribed, and the risks of noncompliance. There is a brief discussion on traveling with oxygen and resources the patient may utilize.   Oxygen Equipment:  -Group instruction provided by [facility] Staff utilizing handouts, written materials, and equipment demonstrations.   Signs and Symptoms:  -Group instruction provided by written material and verbal discussion to support subject matter. Warning signs and symptoms of infection, stroke, and heart attack are reviewed and when to call the physician/911 reinforced. Tips for preventing the spread of infection discussed.   Advanced Directives:  -Group instruction provided by verbal instruction and written material to support subject matter. Instructor reviews Advanced Directive laws and proper instruction for filling out document.   Pulmonary Video:  -Group video education that reviews the importance of medication and oxygen compliance, exercise, good nutrition, pulmonary hygiene, and pursed lip and diaphragmatic breathing for the pulmonary patient.   Exercise for the Pulmonary Patient:  -Group instruction that is supported by a PowerPoint presentation. Instructor discusses benefits of exercise, core components of exercise, frequency, duration, and intensity of an exercise routine, importance of utilizing pulse oximetry during exercise, safety while exercising, and  options of places to exercise outside of rehab.     Pulmonary Medications:  -Verbally interactive group education provided  by instructor with focus on inhaled medications and proper administration.   Anatomy and Physiology of the Respiratory System and Intimacy:  -Group instruction provided by PowerPoint, verbal discussion, and written material to support subject matter. Instructor reviews respiratory cycle and anatomical components of the respiratory system and their functions. Instructor also reviews differences in obstructive and restrictive respiratory diseases with examples of each. Intimacy, Sex, and Sexuality differences are reviewed with a discussion on how relationships can change when diagnosed with pulmonary disease. Common sexual concerns are reviewed.   MD DAY -A group question and answer session with a medical doctor that allows participants to ask questions that relate to their pulmonary disease state.   OTHER EDUCATION -Group or individual verbal, written, or video instructions that support the educational goals of the pulmonary rehab program.   Holiday Eating Survival Tips:  -Group instruction provided by PowerPoint slides, verbal discussion, and written materials to support subject matter. The instructor gives patients tips, tricks, and techniques to help them not only survive but enjoy the holidays despite the onslaught of food that accompanies the holidays.   Knowledge Questionnaire Score:  Knowledge Questionnaire Score - 04/24/20 1258      Knowledge Questionnaire Score   Pre Score 9/18           Core Components/Risk Factors/Patient Goals at Admission:  Personal Goals and Risk Factors at Admission - 04/24/20 1305      Core Components/Risk Factors/Patient Goals on Admission   Improve shortness of breath with ADL's Yes    Intervention Provide education, individualized exercise plan and daily activity instruction to help decrease symptoms of SOB with activities  of daily living.    Expected Outcomes Short Term: Improve cardiorespiratory fitness to achieve a reduction of symptoms when performing ADLs;Long Term: Be able to perform more ADLs without symptoms or delay the onset of symptoms           Core Components/Risk Factors/Patient Goals Review:   Goals and Risk Factor Review    Row Name 04/24/20 1305             Core Components/Risk Factors/Patient Goals Review   Personal Goals Review Increase knowledge of respiratory medications and ability to use respiratory devices properly.;Improve shortness of breath with ADL's;Develop more efficient breathing techniques such as purse lipped breathing and diaphragmatic breathing and practicing self-pacing with activity.              Core Components/Risk Factors/Patient Goals at Discharge (Final Review):   Goals and Risk Factor Review - 04/24/20 1305      Core Components/Risk Factors/Patient Goals Review   Personal Goals Review Increase knowledge of respiratory medications and ability to use respiratory devices properly.;Improve shortness of breath with ADL's;Develop more efficient breathing techniques such as purse lipped breathing and diaphragmatic breathing and practicing self-pacing with activity.           ITP Comments:   Comments:

## 2020-05-02 ENCOUNTER — Other Ambulatory Visit: Payer: Self-pay

## 2020-05-02 ENCOUNTER — Encounter (HOSPITAL_COMMUNITY)
Admission: RE | Admit: 2020-05-02 | Discharge: 2020-05-02 | Disposition: A | Discharge status: Home / Self Care | Payer: Medicare HMO | Source: Ambulatory Visit | Attending: Cardiology | Admitting: Cardiology

## 2020-05-02 DIAGNOSIS — R0600 Dyspnea, unspecified: Secondary | ICD-10-CM

## 2020-05-02 NOTE — Progress Notes (Signed)
Daily Session Note  Patient Details  Name: Angela Mahoney MRN: [id number] Date of Birth: [birth year] Referring Provider:     Pulmonary Rehab Walk Test from 04/30/2020 in [place]  Referring Provider Dr. Royce Macadamia      Encounter Date: 05/02/2020  Check In:  Session Check In - 05/02/20 1005      Check-In   Supervising physician immediately available to respond to emergencies [facility] immediately available    Physician(s) Dr. Coralee Pesa    Location [facility]    Staff Present Rosebud Poles, RN, Roque Cash, RN;Other    Virtual Visit No    Medication changes reported     No    Fall or balance concerns reported    No    Tobacco Cessation No Change    Warm-up and Cool-down Performed on first and last piece of equipment    Resistance Training Performed Yes    VAD Patient? No    PAD/SET Patient? No      Pain Assessment   Currently in Pain? No/denies    Multiple Pain Sites No           Capillary Blood Glucose: No results found for this or any previous visit (from the past 24 hour(s)).    Social History   Tobacco Use  Smoking Status Former Smoker  . Packs/day: 0.50  . Years: 45.00  . Pack years: 22.50  . Types: Cigarettes  Smokeless Tobacco Never Used    Goals Met:  Exercise tolerated well No report of cardiac concerns or symptoms Strength training completed today  Goals Unmet:  Not Applicable  Comments: Service time is from 1000 to 1115    Dr. Fransico Him is Medical Director for Cardiac Rehab at [facility]

## 2020-05-07 ENCOUNTER — Other Ambulatory Visit: Payer: Self-pay

## 2020-05-07 ENCOUNTER — Encounter (HOSPITAL_COMMUNITY)
Admission: RE | Admit: 2020-05-07 | Discharge: 2020-05-07 | Disposition: A | Discharge status: Home / Self Care | Payer: Medicare HMO | Source: Ambulatory Visit | Attending: Cardiology | Admitting: Cardiology

## 2020-05-07 VITALS — Ht 64.0 in | Wt 222.0 lb

## 2020-05-07 DIAGNOSIS — R0600 Dyspnea, unspecified: Secondary | ICD-10-CM | POA: Diagnosis not present

## 2020-05-07 NOTE — Progress Notes (Signed)
Daily Session Note ° °Patient Details  °Name: Krisandra A Zavaleta °MRN: [id number] °Date of Birth: 11/30/1960 °Referring Provider:   °  Pulmonary Rehab Walk Test from 04/30/2020 in [facility]  °Referring Provider Dr. Foster  °  ° ° °Encounter Date: 05/07/2020 ° °Check In: ° Session Check In - 05/07/20 0959   °  ° Check-In  ° Supervising physician immediately available to respond to emergencies [facility] immediately available   ° Physician(s) Dr. Mathews   ° Location [facility]   ° Staff Present Jessica Martin, MS, ACSM-CEP, Exercise Physiologist;Joan Behrens, RN, BSN; , RN   ° Virtual Visit No   ° Medication changes reported     No   ° Fall or balance concerns reported    No   ° Tobacco Cessation No Change   ° Warm-up and Cool-down Performed on first and last piece of equipment   ° Resistance Training Performed Yes   ° VAD Patient? No   ° PAD/SET Patient? No   °  ° Pain Assessment  ° Currently in Pain? No/denies   ° Multiple Pain Sites No   °  °  °  ° ° °Capillary Blood Glucose: °No results found for this or any previous visit (from the past 24 hour(s)). ° ° ° °Social History  ° °Tobacco Use  °Smoking Status Former Smoker  °• Packs/day: 0.50  °• Years: 45.00  °• Pack years: 22.50  °• Types: Cigarettes  °Smokeless Tobacco Never Used  ° ° °Goals Met:  °Exercise tolerated well °No report of cardiac concerns or symptoms °Strength training completed today ° °Goals Unmet:  °Not Applicable ° °Comments: Service time is from 1010 to 1117 ° ° ° °Dr. Traci Turner is Medical Director for Cardiac Rehab at [facility]. °

## 2020-05-07 NOTE — Progress Notes (Signed)
Angela Mahoney 59 y.o. female Nutrition Note  Visit Diagnosis: Dyspnea, unspecified type   Past Medical History:  Diagnosis Date  . COPD (chronic obstructive pulmonary disease) ([place]) 2017  . Coronary artery disease   . Fibromyalgia      Medications reviewed.   Current Outpatient Medications:  .  acetaminophen (TYLENOL) 325 MG tablet, Take 650 mg by mouth 2 (two) times daily as needed for mild pain or headache., Disp: , Rfl:  .  albuterol (VENTOLIN HFA) 108 (90 Base) MCG/ACT inhaler, Inhale 1-2 puffs into the lungs every 6 (six) hours as needed for shortness of breath or wheezing., Disp: , Rfl:  .  ARIPiprazole (ABILIFY) 5 MG tablet, Take 5 mg by mouth every morning., Disp: , Rfl:  .  ascorbic acid (VITAMIN C) 500 MG tablet, Take 500 mg by mouth daily., Disp: , Rfl:  .  aspirin EC 81 MG tablet, Take 81 mg by mouth daily., Disp: , Rfl:  .  atorvastatin (LIPITOR) 80 MG tablet, Take 1 tablet by mouth daily., Disp: , Rfl:  .  baclofen (LIORESAL) 10 MG tablet, Take 1 tablet by mouth daily., Disp: , Rfl:  .  busPIRone (BUSPAR) 30 MG tablet, Take 1 tablet by mouth 3 (three) times daily., Disp: , Rfl:  .  Calcium Citrate-Vitamin D (CALCIUM CITRATE + D PO), Take 1 tablet by mouth daily., Disp: , Rfl:  .  DULoxetine (CYMBALTA) 60 MG capsule, Take 2 capsules by mouth at bedtime., Disp: , Rfl:  .  ergocalciferol (VITAMIN D2) 1.25 MG (50000 UT) capsule, Take 50,000 Units by mouth every Sunday., Disp: , Rfl:  .  famotidine (PEPCID) 20 MG tablet, Take 1 tablet by mouth daily., Disp: , Rfl:  .  ferrous sulfate 325 (65 FE) MG tablet, Take 325 mg by mouth 2 (two) times a week., Disp: , Rfl:  .  fluticasone (FLONASE) 50 MCG/ACT nasal spray, Place 1 spray into both nostrils daily as needed for allergies or rhinitis., Disp: , Rfl:  .  folic acid (FOLVITE) 628 MCG tablet, Take 800 mcg by mouth daily., Disp: , Rfl:  .  gabapentin (NEURONTIN) 300 MG capsule, Take 600-900 mg by mouth See admin  instructions. Take 2 capsules in the morning and dinner time, then take 3 capsules at bedtime, Disp: , Rfl:  .  levothyroxine (SYNTHROID) 25 MCG tablet, Take 1 tablet by mouth daily., Disp: , Rfl:  .  metoprolol tartrate (LOPRESSOR) 50 MG tablet, Take 25 mg by mouth daily., Disp: , Rfl:  .  Multiple Vitamin (MULTIVITAMIN WITH MINERALS) TABS tablet, Take 1 tablet by mouth daily., Disp: , Rfl:  .  NARCAN 4 MG/0.1ML LIQD nasal spray kit, Place 1 spray into the nose as needed for opioid reversal., Disp: , Rfl:  .  nitroGLYCERIN (NITROSTAT) 0.4 MG SL tablet, Place 0.4 mg under the tongue every 5 (five) minutes as needed for chest pain., Disp: , Rfl:  .  ondansetron (ZOFRAN-ODT) 4 MG disintegrating tablet, Take 1 tablet by mouth every 8 (eight) hours as needed for nausea/vomiting., Disp: , Rfl:  .  ranolazine (RANEXA) 500 MG 12 hr tablet, Take 1 tablet by mouth daily., Disp: , Rfl:    Ht Readings from Last 1 Encounters:  04/24/20 '5\' 4"'  (1.626 m)     Wt Readings from Last 3 Encounters:  04/24/20 222 lb 14.2 oz (101.1 kg)  04/03/20 223 lb (101.2 kg)     There is no height or weight on file to calculate BMI.  Social History   Tobacco Use  Smoking Status Former Smoker  . Packs/day: 0.50  . Years: 45.00  . Pack years: 22.50  . Types: Cigarettes  Smokeless Tobacco Never Used      Nutrition Note  Spoke with pt. Nutrition Plan and Nutrition Survey goals reviewed with pt.  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 She feels she never was able to follow the diet prescribed. She has a hard time eating more than 3 times per day. She recently has struggled to get adequate protein. She has lost contact with her follow up team. She reports highest weight 280 lbs and she is now currently 222 lbs. She has weight cycled over past 3 years (+/- 20 lbs). She would like to lose 50 lbs now. She has continued to take vitamins (MVI, B complex, and Calcium citrate.  She has a texture aversion. She will  keep a food log this week. Recommendations will be made according to food log.  Pt expressed understanding of the information reviewed.   Nutrition Diagnosis ? Obese  II = 35-39.9 related to excessive energy intake as evidenced by a BMI 38.11 kg/m2  Nutrition Intervention ? Pt's individual nutrition plan reviewed with pt. ? Benefits of adopting healthy diet reviewed with Rate My Plate survey   ? Continue client-centered nutrition education by RD, as part of interdisciplinary care.  Goal(s) ? Pt to identify food quantities necessary to achieve weight loss of 6-24 lb at graduation from pulmonary rehab.  ? Pt to incorporate protein rich meals with veggie and fruit  Plan:   Will provide client-centered nutrition education as part of interdisciplinary care  Monitor and evaluate progress toward nutrition goal with team.   Michaele Offer, MS, RDN, LDN

## 2020-05-09 ENCOUNTER — Encounter (HOSPITAL_COMMUNITY): Payer: Medicare HMO

## 2020-05-14 ENCOUNTER — Encounter (HOSPITAL_COMMUNITY): Payer: Medicare HMO

## 2020-05-16 ENCOUNTER — Encounter (HOSPITAL_COMMUNITY): Payer: Medicare HMO

## 2020-05-16 ENCOUNTER — Telehealth (HOSPITAL_COMMUNITY): Payer: Self-pay | Admitting: *Deleted

## 2020-05-21 ENCOUNTER — Encounter (HOSPITAL_COMMUNITY)
Admission: RE | Admit: 2020-05-21 | Discharge: 2020-05-21 | Disposition: A | Discharge status: Home / Self Care | Payer: Medicare HMO | Source: Ambulatory Visit | Attending: Cardiology | Admitting: Cardiology

## 2020-05-21 ENCOUNTER — Other Ambulatory Visit: Payer: Self-pay

## 2020-05-21 DIAGNOSIS — R0600 Dyspnea, unspecified: Secondary | ICD-10-CM | POA: Diagnosis present

## 2020-05-21 NOTE — Progress Notes (Signed)
Nutrition Note - Follow up  Reviewed food log. She eats small meals.  Difficulty forming complete, nutrient dense meals. Typically chooses refined grains and often forgets to choose a protein. Getting takeout for breakfast often - dunkin donut "wake up wrap" with sausage and cheese. Short term goal setting with pt.   Provided handout for easy reference to include fiber/protein. We discussed being proactive by taking list and grocery shopping to prepare for the week.   She brainstormed ideas for ways to include these foods: whole grain english muffin with peanut butter, baked potato with salad and chicken, tuna salad on a whole grain wrap, baby bell or laughing cow cheeses.  Pt expressed understanding of the information reviewed.   Nutrition Diagnosis   Obese  II = 35-39.9 related to excessive energy intake as evidenced by a BMI 38.11 kg/m2  Nutrition Intervention   Pts individual nutrition plan reviewed with pt.  Benefits of adopting healthy diet reviewed with Rate My Plate survey                Continue client-centered nutrition education by RD, as part of interdisciplinary care.  Goal(s)  Pt to identify food quantities necessary to achieve weight loss of 6-24 lb at graduation from pulmonary rehab.   Pt to incorporate protein rich meals with veggie and fruit  She will incorporate at least 2 meals per day with a protein and a fiber source.  Plan:  Will provide client-centered nutrition education as part of interdisciplinary care  Monitor and evaluate progress toward nutrition goal with team.   Andrey Campanile, MS, RDN, LDN

## 2020-05-21 NOTE — Progress Notes (Signed)
Daily Session Note  Patient Details  Name: Dayjah A Braman MRN: [id number] Date of Birth: 05/07/1961 Referring Provider:     Pulmonary Rehab Walk Test from 04/30/2020 in [facility]  Referring Provider Dr. Foster      Encounter Date: 05/21/2020  Check In:  Session Check In - 05/21/20 1004      Check-In   Supervising physician immediately available to respond to emergencies [facility] immediately available    Physician(s) Dr. Mathews    Location [facility]    Staff Present  , RN, BSN;Carlette Carlton, RN, BSN;Lisa Hughes, RN    Virtual Visit No    Medication changes reported     No    Fall or balance concerns reported    No    Tobacco Cessation No Change    Warm-up and Cool-down Performed as group-led instruction    Resistance Training Performed Yes    VAD Patient? No    PAD/SET Patient? No      Pain Assessment   Currently in Pain? No/denies    Multiple Pain Sites No           Capillary Blood Glucose: No results found for this or any previous visit (from the past 24 hour(s)).    Social History   Tobacco Use  Smoking Status Former Smoker  . Packs/day: 0.50  . Years: 45.00  . Pack years: 22.50  . Types: Cigarettes  Smokeless Tobacco Never Used    Goals Met:  Proper associated with RPD/PD & O2 Sat Exercise tolerated well Strength training completed today  Goals Unmet:  Not Applicable  Comments: Service time is from 1000 to 1107.    Dr. Traci Turner is Medical Director for Cardiac Rehab at  Hospital. 

## 2020-05-22 ENCOUNTER — Telehealth (HOSPITAL_COMMUNITY): Payer: Self-pay | Admitting: *Deleted

## 2020-05-22 NOTE — Telephone Encounter (Signed)
Returned phone call, Angela Mahoney reports falling last night at home, the brake was not on her walker and she fell backwards. She is scheduled to see her PCP 05/23/20 for evaluation and will not be in pulmonary rehab tomorrow.

## 2020-05-23 ENCOUNTER — Encounter (HOSPITAL_COMMUNITY): Payer: Medicare HMO

## 2020-05-28 ENCOUNTER — Telehealth (HOSPITAL_COMMUNITY): Payer: Self-pay | Admitting: *Deleted

## 2020-05-28 ENCOUNTER — Encounter (HOSPITAL_COMMUNITY)
Admission: RE | Admit: 2020-05-28 | Discharge: 2020-05-28 | Disposition: A | Discharge status: Home / Self Care | Payer: Medicare HMO | Source: Ambulatory Visit | Attending: Cardiology | Admitting: Cardiology

## 2020-05-28 NOTE — Telephone Encounter (Signed)
Called to check on Angela Mahoney, she was absent from pulmonary rehab today.  She is being referred to physical therapy to assist her in her frequent falls since starting pulmonary rehab.  She is being discharged from pulmonary rehab.

## 2020-05-30 ENCOUNTER — Encounter (HOSPITAL_COMMUNITY): Payer: Medicare HMO

## 2020-06-04 ENCOUNTER — Encounter (HOSPITAL_COMMUNITY): Payer: Medicare HMO

## 2020-06-04 NOTE — Progress Notes (Signed)
Discharge Progress Report  Patient Details  Name: Angela Mahoney MRN: [id number] Date of Birth: Feb 09, 1961 Referring Provider:     Pulmonary Rehab Walk Test from 04/30/2020 in [facility]  Referring Provider Dr. Malen Gauze       Number of Visits: 3  Reason for Discharge:  Early Exit:  Due to multiple falls at home, has been referred to physical therapy  Smoking History:  Social History   Tobacco Use  Smoking Status Former Smoker  . Packs/day: 0.50  . Years: 45.00  . Pack years: 22.50  . Types: Cigarettes  Smokeless Tobacco Never Used    Diagnosis:  Dyspnea, unspecified type  ADL UCSD:  Pulmonary Assessment Scores    Row Name 04/24/20 1258         ADL UCSD   ADL Phase Entry     SOB Score total 73       CAT Score   CAT Score 24       mMRC Score   mMRC Score 4            Initial Exercise Prescription:  Initial Exercise Prescription - 04/30/20 1200      Date of Initial Exercise RX and Referring Provider   Date 04/30/20    Referring Provider Dr. Malen Gauze      NuStep   Level 2    SPM 80    Minutes 15      Arm Ergometer   Level 1    RPM 60    Minutes 15      Prescription Details   Frequency (times per week) 2    Duration Progress to 30 minutes of continuous aerobic without signs/symptoms of physical distress      Intensity   THRR 40-80% of Max Heartrate 64-127    Ratings of Perceived Exertion 11-13    Perceived Dyspnea 0-4      Resistance Training   Training Prescription Yes    Weight orange band    Reps 10-15           Discharge Exercise Prescription (Final Exercise Prescription Changes):   Functional Capacity:  6 Minute Walk    Row Name 04/30/20 1211         6 Minute Walk   Phase Initial     Distance 827 feet     Walk Time 6 minutes     # of Rest Breaks 0     MPH 1.57     METS 1.78     RPE 13     Perceived Dyspnea  2     VO2 Peak 6.23     Symptoms Yes (comment)     Comments Right hip and  knee pain 9/10     Resting HR 88 bpm     Resting BP 108/60     Resting Oxygen Saturation  97 %     Exercise Oxygen Saturation  during 6 min walk 94 %     Max Ex. HR 94 bpm     Max Ex. BP 116/64     2 Minute Post BP 98/62       Interval HR   1 Minute HR 88     2 Minute HR 94     3 Minute HR 94     4 Minute HR 93     5 Minute HR 89     6 Minute HR 94     2 Minute Post HR 80  Interval Heart Rate? Yes       Interval Oxygen   Interval Oxygen? Yes     Baseline Oxygen Saturation % 97 %     1 Minute Oxygen Saturation % 97 %     1 Minute Liters of Oxygen 0 L     2 Minute Oxygen Saturation % 94 %     2 Minute Liters of Oxygen 0 L     3 Minute Oxygen Saturation % 97 %     3 Minute Liters of Oxygen 0 L     4 Minute Oxygen Saturation % 95 %     4 Minute Liters of Oxygen 0 L     5 Minute Oxygen Saturation % 97 %     5 Minute Liters of Oxygen 0 L     6 Minute Oxygen Saturation % 96 %     6 Minute Liters of Oxygen 0 L     2 Minute Post Oxygen Saturation % 98 %     2 Minute Post Liters of Oxygen 0 L            Psychological, QOL, Others - Outcomes: PHQ 2/9: Depression screen PHQ 2/9 04/24/2020  Decreased Interest 3  Down, Depressed, Hopeless 1  PHQ - 2 Score 4  Altered sleeping 3  Tired, decreased energy 3  Change in appetite 3  Feeling bad or failure about yourself  1  Trouble concentrating 3  Moving slowly or fidgety/restless 0  Suicidal thoughts 0  Difficult doing work/chores Not difficult at all    Quality of Life:   Personal Goals: Goals established at orientation with interventions provided to work toward goal.  Personal Goals and Risk Factors at Admission - 04/24/20 1305      Core Components/Risk Factors/Patient Goals on Admission   Improve shortness of breath with ADL's Yes    Intervention Provide education, individualized exercise plan and daily activity instruction to help decrease symptoms of SOB with activities of daily living.    Expected Outcomes Short  Term: Improve cardiorespiratory fitness to achieve a reduction of symptoms when performing ADLs;Long Term: Be able to perform more ADLs without symptoms or delay the onset of symptoms            Personal Goals Discharge:  Goals and Risk Factor Review    Row Name 04/24/20 1305 05/27/20 1327           Core Components/Risk Factors/Patient Goals Review   Personal Goals Review Increase knowledge of respiratory medications and ability to use respiratory devices properly.;Improve shortness of breath with ADL's;Develop more efficient breathing techniques such as purse lipped breathing and diaphragmatic breathing and practicing self-pacing with activity. Increase knowledge of respiratory medications and ability to use respiratory devices properly.;Improve shortness of breath with ADL's;Develop more efficient breathing techniques such as purse lipped breathing and diaphragmatic breathing and practicing self-pacing with activity.      Review -- Angela Mahoney has had 2 falls at home without injuries, her PCP has adjusted her medications thinking her falls are due to hypotension.  Her attendance has been poor d/t the falls, is not progressing, has attended 3 exercise sessions.      Expected Outcomes -- See admission goals.             Exercise Goals and Review:  Exercise Goals    Row Name 04/30/20 1216 05/28/20 0652           Exercise Goals   Increase Physical Activity Yes Yes  Intervention Provide advice, education, support and counseling about physical activity/exercise needs.;Develop an individualized exercise prescription for aerobic and resistive training based on initial evaluation findings, risk stratification, comorbidities and participant's personal goals. Provide advice, education, support and counseling about physical activity/exercise needs.;Develop an individualized exercise prescription for aerobic and resistive training based on initial evaluation findings, risk stratification,  comorbidities and participant's personal goals.      Expected Outcomes Short Term: Attend rehab on a regular basis to increase amount of physical activity.;Long Term: Add in home exercise to make exercise part of routine and to increase amount of physical activity.;Long Term: Exercising regularly at least 3-5 days a week. Short Term: Attend rehab on a regular basis to increase amount of physical activity.;Long Term: Add in home exercise to make exercise part of routine and to increase amount of physical activity.;Long Term: Exercising regularly at least 3-5 days a week.      Increase Strength and Stamina Yes Yes      Intervention Provide advice, education, support and counseling about physical activity/exercise needs.;Develop an individualized exercise prescription for aerobic and resistive training based on initial evaluation findings, risk stratification, comorbidities and participant's personal goals. Provide advice, education, support and counseling about physical activity/exercise needs.;Develop an individualized exercise prescription for aerobic and resistive training based on initial evaluation findings, risk stratification, comorbidities and participant's personal goals.      Expected Outcomes Short Term: Increase workloads from initial exercise prescription for resistance, speed, and METs.;Short Term: Perform resistance training exercises routinely during rehab and add in resistance training at home;Long Term: Improve cardiorespiratory fitness, muscular endurance and strength as measured by increased METs and functional capacity ( ) Short Term: Increase workloads from initial exercise prescription for resistance, speed, and METs.;Short Term: Perform resistance training exercises routinely during rehab and add in resistance training at home;Long Term: Improve cardiorespiratory fitness, muscular endurance and strength as measured by increased METs and functional capacity ( )      Able to understand  and use rate of perceived exertion (RPE) scale Yes Yes      Intervention Provide education and explanation on how to use RPE scale Provide education and explanation on how to use RPE scale      Expected Outcomes Short Term: Able to use RPE daily in rehab to express subjective intensity level;Long Term:  Able to use RPE to guide intensity level when exercising independently Short Term: Able to use RPE daily in rehab to express subjective intensity level;Long Term:  Able to use RPE to guide intensity level when exercising independently      Able to understand and use Dyspnea scale Yes Yes      Intervention Provide education and explanation on how to use Dyspnea scale Provide education and explanation on how to use Dyspnea scale      Expected Outcomes Short Term: Able to use Dyspnea scale daily in rehab to express subjective sense of shortness of breath during exertion;Long Term: Able to use Dyspnea scale to guide intensity level when exercising independently Short Term: Able to use Dyspnea scale daily in rehab to express subjective sense of shortness of breath during exertion;Long Term: Able to use Dyspnea scale to guide intensity level when exercising independently      Knowledge and understanding of Target Heart Rate Range (THRR) Yes Yes      Intervention Provide education and explanation of THRR including how the numbers were predicted and where they are located for reference Provide education and explanation of THRR including how the numbers were  predicted and where they are located for reference      Expected Outcomes Short Term: Able to state/look up THRR;Short Term: Able to use daily as guideline for intensity in rehab;Long Term: Able to use THRR to govern intensity when exercising independently Short Term: Able to state/look up THRR;Short Term: Able to use daily as guideline for intensity in rehab;Long Term: Able to use THRR to govern intensity when exercising independently      Understanding of  Exercise Prescription Yes Yes      Intervention Provide education, explanation, and written materials on patient's individual exercise prescription Provide education, explanation, and written materials on patient's individual exercise prescription      Expected Outcomes Short Term: Able to explain program exercise prescription;Long Term: Able to explain home exercise prescription to exercise independently Short Term: Able to explain program exercise prescription;Long Term: Able to explain home exercise prescription to exercise independently             Exercise Goals Re-Evaluation:  Exercise Goals Re-Evaluation    Row Name 05/28/20 0653             Exercise Goal Re-Evaluation   Exercise Goals Review Increase Physical Activity;Increase Strength and Stamina;Able to understand and use rate of perceived exertion (RPE) scale;Able to understand and use Dyspnea scale;Knowledge and understanding of Target Heart Rate Range (THRR);Understanding of Exercise Prescription       Comments Pt has completed 3 exercise sessions. She has fallen twice at home in the past few weeks so her attendance has not been good up to this point. She is currently exercising at 2.0 METs on the stepper. Will continue to monitor and progress as able.       Expected Outcomes Through exercise at rehab and at home, the patient will decrease shortness of breath with daily activities and feel confident in carrying out an exercise regime at home.              Nutrition & Weight - Outcomes:  Pre Biometrics - 04/24/20 0942      Pre Biometrics   Grip Strength 12 kg            Nutrition:  Nutrition Therapy & Goals - 05/07/20 1151      Nutrition Therapy   Diet High protein, Heart healthy      Personal Nutrition Goals   Nutrition Goal Pt to identify food quantities necessary to achieve weight loss of 6-24 lb at graduation from pulmonary rehab.    Personal Goal #2 Pt to incorporate protein rich meals with veggie and  fruit      Intervention Plan   Intervention Prescribe, educate and counsel regarding individualized specific dietary modifications aiming towards targeted core components such as weight, hypertension, lipid management, diabetes, heart failure and other comorbidities.;Nutrition handout(s) given to patient.    Expected Outcomes Short Term Goal: A plan has been developed with personal nutrition goals set during dietitian appointment.;Long Term Goal: Adherence to prescribed nutrition plan.           Nutrition Discharge:  Nutrition Assessments - 05/07/20 1152      MEDFICTS Scores   Pre Score 57           Education Questionnaire Score:  Knowledge Questionnaire Score - 04/24/20 1258      Knowledge Questionnaire Score   Pre Score 9/18           Goals reviewed with patient; copy given to patient.

## 2020-06-06 ENCOUNTER — Encounter (HOSPITAL_COMMUNITY): Payer: Medicare HMO

## 2020-06-11 ENCOUNTER — Encounter (HOSPITAL_COMMUNITY): Payer: Medicare HMO

## 2020-06-13 ENCOUNTER — Encounter (HOSPITAL_COMMUNITY): Payer: Medicare HMO

## 2020-06-18 ENCOUNTER — Encounter (HOSPITAL_COMMUNITY): Payer: Medicare HMO

## 2020-06-19 ENCOUNTER — Other Ambulatory Visit: Payer: Self-pay | Admitting: Obstetrics and Gynecology

## 2020-06-19 DIAGNOSIS — R928 Other abnormal and inconclusive findings on diagnostic imaging of breast: Secondary | ICD-10-CM

## 2020-06-20 ENCOUNTER — Encounter (HOSPITAL_COMMUNITY): Payer: Medicare HMO

## 2020-06-25 ENCOUNTER — Encounter (HOSPITAL_COMMUNITY): Payer: Medicare HMO

## 2020-06-27 ENCOUNTER — Encounter (HOSPITAL_COMMUNITY): Payer: Medicare HMO

## 2020-06-28 ENCOUNTER — Ambulatory Visit: Payer: Medicare HMO

## 2020-06-28 ENCOUNTER — Ambulatory Visit
Admission: RE | Admit: 2020-06-28 | Discharge: 2020-06-28 | Disposition: A | Discharge status: Home / Self Care | Payer: Medicare HMO | Source: Ambulatory Visit | Attending: Obstetrics and Gynecology | Admitting: Obstetrics and Gynecology

## 2020-06-28 ENCOUNTER — Other Ambulatory Visit: Payer: Self-pay

## 2020-06-28 DIAGNOSIS — R928 Other abnormal and inconclusive findings on diagnostic imaging of breast: Secondary | ICD-10-CM

## 2021-07-25 ENCOUNTER — Encounter (HOSPITAL_COMMUNITY): Payer: Self-pay | Admitting: Emergency Medicine

## 2021-07-25 ENCOUNTER — Ambulatory Visit (INDEPENDENT_AMBULATORY_CARE_PROVIDER_SITE_OTHER): Payer: Medicare (Managed Care)

## 2021-07-25 ENCOUNTER — Emergency Department (HOSPITAL_COMMUNITY)
Admission: EM | Admit: 2021-07-25 | Discharge: 2021-07-25 | Disposition: A | Discharge status: Home / Self Care | Payer: Medicare (Managed Care) | Attending: Emergency Medicine | Admitting: Emergency Medicine

## 2021-07-25 ENCOUNTER — Other Ambulatory Visit: Payer: Self-pay

## 2021-07-25 ENCOUNTER — Emergency Department (HOSPITAL_COMMUNITY): Payer: Medicare (Managed Care)

## 2021-07-25 ENCOUNTER — Ambulatory Visit (HOSPITAL_COMMUNITY)
Admission: EM | Admit: 2021-07-25 | Discharge: 2021-07-25 | Payer: Medicare (Managed Care) | Attending: Family Medicine | Admitting: Family Medicine

## 2021-07-25 ENCOUNTER — Encounter (HOSPITAL_COMMUNITY): Payer: Self-pay

## 2021-07-25 DIAGNOSIS — Z7951 Long term (current) use of inhaled steroids: Secondary | ICD-10-CM | POA: Diagnosis not present

## 2021-07-25 DIAGNOSIS — S99911A Unspecified injury of right ankle, initial encounter: Secondary | ICD-10-CM | POA: Diagnosis present

## 2021-07-25 DIAGNOSIS — J449 Chronic obstructive pulmonary disease, unspecified: Secondary | ICD-10-CM | POA: Diagnosis not present

## 2021-07-25 DIAGNOSIS — W010XXA Fall on same level from slipping, tripping and stumbling without subsequent striking against object, initial encounter: Secondary | ICD-10-CM | POA: Diagnosis not present

## 2021-07-25 DIAGNOSIS — I2510 Atherosclerotic heart disease of native coronary artery without angina pectoris: Secondary | ICD-10-CM | POA: Insufficient documentation

## 2021-07-25 DIAGNOSIS — Z87891 Personal history of nicotine dependence: Secondary | ICD-10-CM | POA: Insufficient documentation

## 2021-07-25 DIAGNOSIS — S82831A Other fracture of upper and lower end of right fibula, initial encounter for closed fracture: Secondary | ICD-10-CM | POA: Diagnosis not present

## 2021-07-25 DIAGNOSIS — M79604 Pain in right leg: Secondary | ICD-10-CM

## 2021-07-25 DIAGNOSIS — W19XXXA Unspecified fall, initial encounter: Secondary | ICD-10-CM

## 2021-07-25 DIAGNOSIS — S8254XA Nondisplaced fracture of medial malleolus of right tibia, initial encounter for closed fracture: Secondary | ICD-10-CM | POA: Diagnosis not present

## 2021-07-25 DIAGNOSIS — S82851A Displaced trimalleolar fracture of right lower leg, initial encounter for closed fracture: Secondary | ICD-10-CM | POA: Insufficient documentation

## 2021-07-25 DIAGNOSIS — R42 Dizziness and giddiness: Secondary | ICD-10-CM | POA: Diagnosis not present

## 2021-07-25 DIAGNOSIS — Z79899 Other long term (current) drug therapy: Secondary | ICD-10-CM | POA: Insufficient documentation

## 2021-07-25 DIAGNOSIS — Z7982 Long term (current) use of aspirin: Secondary | ICD-10-CM | POA: Diagnosis not present

## 2021-07-25 DIAGNOSIS — S9304XA Dislocation of right ankle joint, initial encounter: Secondary | ICD-10-CM

## 2021-07-25 MED ORDER — HYDROCODONE-ACETAMINOPHEN 5-325 MG PO TABS: 1.0000 | ORAL_TABLET | Freq: Four times a day (QID) | ORAL | 0 refills | Status: DC | PRN

## 2021-07-25 MED ORDER — HYDROCODONE-ACETAMINOPHEN 5-325 MG PO TABS
1.0000 | ORAL_TABLET | Freq: Once | ORAL | Status: AC
  Administered 2021-07-25: 1 via ORAL

## 2021-07-25 MED ORDER — GENTAMICIN SULFATE 40 MG/ML IJ SOLN: 240.0000 mg | Freq: Once | INTRAMUSCULAR | Status: DC

## 2021-07-25 MED ORDER — FENTANYL CITRATE PF 50 MCG/ML IJ SOSY
50.0000 ug | PREFILLED_SYRINGE | Freq: Once | INTRAMUSCULAR | Status: AC
  Administered 2021-07-25: 50 ug via RESPIRATORY_TRACT
  Filled 2021-07-25: qty 1

## 2021-07-25 MED ORDER — HYDROCODONE-ACETAMINOPHEN 5-325 MG PO TABS
ORAL_TABLET | ORAL | Status: AC
  Filled 2021-07-25: qty 1

## 2021-07-25 MED ORDER — HYDROCODONE-ACETAMINOPHEN 5-325 MG PO TABS: 1.0000 | ORAL_TABLET | Freq: Three times a day (TID) | ORAL | 0 refills | Status: DC | PRN

## 2021-07-25 MED ORDER — HYDROCODONE-ACETAMINOPHEN 5-325 MG PO TABS
1.0000 | ORAL_TABLET | Freq: Once | ORAL | Status: AC
  Administered 2021-07-25: 1 via ORAL
  Filled 2021-07-25: qty 1

## 2021-07-25 NOTE — ED Provider Notes (Signed)
[facility]    CSN: [id number] Arrival date & time: 07/25/21  1036      History   Chief Complaint Chief Complaint  Patient presents with   Fall    HPI Angela Mahoney is a 60 y.o. female.   Patient presenting today with severe right ankle pain and swelling extending up the lateral leg after her leg buckled from under her and she fell on top of it this morning.  She is unable to move the ankle but is able to wiggle all 5 of her toes.  Denies numbness, tingling, discoloration.  Has not yet taken anything for pain.   Past Medical History:  Diagnosis Date   COPD (chronic obstructive pulmonary disease) ([place]) 2017   Coronary artery disease    Fibromyalgia     There are no problems to display for this patient.   Past Surgical History:  Procedure Laterality Date   CARDIAC CATHETERIZATION      OB History   No obstetric history on file.      Home Medications    Prior to Admission medications   Medication Sig Start Date End Date Taking? Authorizing Provider  HYDROcodone-acetaminophen (NORCO/VICODIN) 5-325 MG tablet Take 1 tablet by mouth 3 (three) times daily as needed for moderate pain. 07/25/21  Yes Volney American, PA-C  acetaminophen (TYLENOL) 325 MG tablet Take 650 mg by mouth 2 (two) times daily as needed for mild pain or headache.    [provider]  albuterol (VENTOLIN HFA) 108 (90 Base) MCG/ACT inhaler Inhale 1-2 puffs into the lungs every 6 (six) hours as needed for shortness of breath or wheezing. 02/07/20   [provider]  ARIPiprazole (ABILIFY) 5 MG tablet Take 5 mg by mouth every morning. 04/18/20   [provider]  ascorbic acid (VITAMIN C) 500 MG tablet Take 500 mg by mouth daily.    [provider]  aspirin EC 81 MG tablet Take 81 mg by mouth daily.    [provider]  atorvastatin (LIPITOR) 80 MG tablet Take 1 tablet by mouth daily. 03/13/20   [provider]  baclofen (LIORESAL) 10  MG tablet Take 1 tablet by mouth daily. 12/23/19   [provider]  busPIRone (BUSPAR) 30 MG tablet Take 1 tablet by mouth 3 (three) times daily. 02/14/20   [provider]  Calcium Citrate-Vitamin D (CALCIUM CITRATE + D PO) Take 1 tablet by mouth daily.    [provider]  DULoxetine (CYMBALTA) 60 MG capsule Take 2 capsules by mouth at bedtime. 02/24/20   [provider]  ergocalciferol (VITAMIN D2) 1.25 MG (50000 UT) capsule Take 50,000 Units by mouth every Sunday.    [provider]  famotidine (PEPCID) 20 MG tablet Take 1 tablet by mouth daily. 02/14/20   [provider]  ferrous sulfate 325 (65 FE) MG tablet Take 325 mg by mouth 2 (two) times a week.    [provider]  fluticasone (FLONASE) 50 MCG/ACT nasal spray Place 1 spray into both nostrils daily as needed for allergies or rhinitis.    [provider]  folic acid (FOLVITE) 798 MCG tablet Take 800 mcg by mouth daily.    [provider]  gabapentin (NEURONTIN) 300 MG capsule Take 600-900 mg by mouth See admin instructions. Take 2 capsules in the morning and dinner time, then take 3 capsules at bedtime 03/06/20   [provider]  levothyroxine (SYNTHROID) 25 MCG tablet Take 1 tablet by mouth daily.  02/24/20   [provider]  metoprolol tartrate (LOPRESSOR) 50 MG tablet Take 25 mg by mouth daily.    [provider]  Multiple Vitamin (MULTIVITAMIN WITH MINERALS) TABS tablet Take 1 tablet by mouth daily.    [provider]  NARCAN 4 MG/0.1ML LIQD nasal spray kit Place 1 spray into the nose as needed for opioid reversal. 12/29/19   [provider]  nitroGLYCERIN (NITROSTAT) 0.4 MG SL tablet Place 0.4 mg under the tongue every 5 (five) minutes as needed for chest pain.    [provider]  ondansetron (ZOFRAN-ODT) 4 MG disintegrating tablet Take 1 tablet by mouth every 8 (eight) hours as needed for nausea/vomiting. 12/29/19    [provider]  ranolazine (RANEXA) 500 MG 12 hr tablet Take 1 tablet by mouth daily. 03/15/20   [provider]    Family History History reviewed. No pertinent family history.  Social History Social History   Tobacco Use   Smoking status: Former    Packs/day: 0.50    Years: 45.00    Pack years: 22.50    Types: Cigarettes   Smokeless tobacco: Never  Substance Use Topics   Alcohol use: Never   Drug use: Never     Allergies   Zolpidem   Review of Systems Review of Systems Per HPI  Physical Exam Triage Vital Signs ED Triage Vitals  Enc Vitals Group     BP 07/25/21 1239 91/65     Pulse Rate 07/25/21 1239 76     Resp 07/25/21 1239 17     Temp 07/25/21 1239 97.6 F (36.4 C)     Temp Source 07/25/21 1239 Oral     SpO2 07/25/21 1239 93 %     Weight --      Height --      Head Circumference --      Peak Flow --      Pain Score 07/25/21 1235 8     Pain Loc --      Pain Edu? --      Excl. in [place]? --    No data found.  Updated Vital Signs BP 91/65 (BP Location: Right Arm) Comment: new medication for low BP  Pulse 76   Temp 97.6 F (36.4 C) (Oral)   Resp 17   SpO2 93%   Visual Acuity Right Eye Distance:   Left Eye Distance:   Bilateral Distance:    Right Eye Near:   Left Eye Near:    Bilateral Near:     Physical Exam Vitals and nursing note reviewed.  Constitutional:      Appearance: Normal appearance. She is not ill-appearing.  HENT:     Head: Atraumatic.  Eyes:     Extraocular Movements: Extraocular movements intact.     Conjunctiva/sclera: Conjunctivae normal.  Cardiovascular:     Rate and Rhythm: Normal rate and regular rhythm.     Heart sounds: Normal heart sounds.  Pulmonary:     Effort: Pulmonary effort is normal.  Musculoskeletal:     Cervical back: Normal range of motion and neck supple.     Comments: In wheelchair due to ankle injury, ambulatory at baseline with a walker Significant edema to right ankle diffusely,  worse on the lateral aspect extending down into foot.  Severe tenderness palpation in this area.  No range of motion in the ankle, all 5 toes full range of motion  Skin:    General: Skin is warm and dry.  Neurological:  Mental Status: She is alert and oriented to person, place, and time.     Comments: Right foot neurovascularly intact  Psychiatric:        Mood and Affect: Mood normal.        Thought Content: Thought content normal.        Judgment: Judgment normal.     UC Treatments / Results  Labs (all labs ordered are listed, but only abnormal results are displayed) Labs Reviewed - No data to display  EKG   Radiology DG Tibia/Fibula Right  Result Date: 07/25/2021 CLINICAL DATA:  Right leg pain after fall today. EXAM: RIGHT TIBIA AND FIBULA - 2 VIEW COMPARISON:  None. FINDINGS: Moderate lateral dislocation of the talus relative to the distal tibia is noted. Moderately displaced fractures are seen involving the distal right fibula and the medial malleolus of the distal tibia. IMPRESSION: Moderate lateral dislocation of the talotibial joint is noted with moderately displaced fractures involving the distal right fibula and medial malleolus of the distal tibia. Electronically Signed   By: Marijo Conception M.D.   On: 07/25/2021 13:45    Procedures Procedures (including critical care time)  Medications Ordered in UC Medications  HYDROcodone-acetaminophen (NORCO/VICODIN) 5-325 MG per tablet 1 tablet (1 tablet Oral Given 07/25/21 1416)    Initial Impression / Assessment and Plan / UC Course  I have reviewed the triage vital signs and the nursing notes.  Pertinent labs & imaging results that were available during my care of the patient were reviewed by me and considered in my medical decision making (see chart for details).     Patient with numerous injuries to the right ankle from a fall that occurred this morning.  She has a lateral dislocation, a distal fibular fracture that is  displaced, medial malleolus fracture.  She was given a hydrocodone-acetaminophen tablet in clinic as she is not transporting herself today.  [facility] was called to place a supportive splint to stabilize the area during transport to the emergency department for further evaluation.  She tried to use the crutches while [facility] was conditioning up with her splint and states that she stepped wrong and she heard a popping sound in the ankle and her pain became more severe.  She declines reimaging in this department and wishes to have further evaluation in the emergency department given the severity of her pain.  Her husband transferred her via private vehicle to the emergency department for further management of the ankle injuries.  Final Clinical Impressions(s) / UC Diagnoses   Final diagnoses:  Closed fracture of distal end of right fibula, unspecified fracture morphology, initial encounter  Closed nondisplaced fracture of medial malleolus of right tibia, initial encounter  Ankle dislocation, right, initial encounter   Discharge Instructions   None    ED Prescriptions     Medication Sig Dispense Auth. Provider   HYDROcodone-acetaminophen (NORCO/VICODIN) 5-325 MG tablet Take 1 tablet by mouth 3 (three) times daily as needed for moderate pain. 15 tablet Volney American, [place]      I have reviewed the PDMP during this encounter.   Volney American, [place] 07/25/21 1515

## 2021-07-25 NOTE — Progress Notes (Signed)
Orthopedic Tech Progress Note Patient Details:  Angela Mahoney [birth year] [id number] Patient did hurt ankle while trying to use crutches and said she felt her ankle "pop" and immediately sat down afterwards. Patient did state she knew how to use crutches properly beforehand.  Ortho Devices Type of Ortho Device: Post (short leg) splint, Crutches Ortho Device/Splint Location: Right leg Ortho Device/Splint Interventions: Application   Post Interventions Patient Tolerated: Unable to use device properly, Well Instructions Provided: Care of device  Tradarius Reinwald E Quintessa Simmerman 07/25/2021, 2:07 PM

## 2021-07-25 NOTE — ED Provider Notes (Signed)
Emergency Medicine Provider Triage Evaluation Note  Angela Mahoney , a 60 y.o. female  was evaluated in triage.  Pt complains of right ankle pain.  She was originally seen at urgent care after her leg buckled from under this morning and she was unable to move her ankle.  She went to urgent care, had an x-ray of the tib-fib showing moderate lateral dislocation of the talus relative to the distal tibia with a displaced fracture involving the distal fibular fragment and medial malleolus of the distal tibia.  Patient reportedly had a splint placed and when attempting to use crutches to ambulate she then had another fall and had worse pain in her ankle.  She denies striking her head with any of these.  She does note that her blood pressure is usually low, she normally runs in the 80s to 90s systolic.  She has recently started some new medicine to try and elevate her blood pressure.  Review of Systems  Positive: Ankle pain Negative: Frequent falls.   Physical Exam  BP (!) 88/65 (BP Location: Left Arm)   Pulse 85   Temp 98.4 F (36.9 C) (Oral)   Resp 14   SpO2 99%  Gen:   Awake, no distress   Resp:  Normal effort  MSK:   Moves extremities without difficulty for right ankle which is in splint.  Able to move toes on right foot Other:  Awake and alert, answers questions appropriately without difficulty  Medical Decision Making  Medically screening exam initiated at 3:26 PM.  Appropriate orders placed.  Angela Mahoney was informed that the remainder of the evaluation will be completed by another provider, this initial triage assessment does not replace that evaluation, and the importance of remaining in the ED until their evaluation is complete.  Patient here for evaluation after multiple falls.  Since her initial x-ray she had a second episode where she partially fell with worsening pain in her ankle.  She did have a splint on, however given her worsening pain will obtain repeat  imaging. She is borderline hypotensive with a blood pressure in the 80s systolic which she reports is her normal baseline.  Note: Portions of this report may have been transcribed using voice recognition software. Every effort was made to ensure accuracy; however, inadvertent computerized transcription errors may be present    Angela Mahoney 07/25/21 1530    Cathren Laine, MD 07/25/21 [phone]

## 2021-07-25 NOTE — Discharge Instructions (Signed)
You have been diagnosed with a right ankle fracture that would likely benefit from surgical repair.  Please do not bear any weight on the ankle.  Take pain medication as needed but be aware that it can cause drowsiness and lower your blood pressure.  Use wheelchair as needed.  Call orthopedist office next week for further management.

## 2021-07-25 NOTE — Care Management (Signed)
Attempted to contact patient  left VM concerning  w/c  not  covered  by insurance with [employer] . [employer] company is in network with [employer] and has accepted the referral and has agreed to deliver w/c to patient;'s home tomorrow.

## 2021-07-25 NOTE — ED Triage Notes (Signed)
Pt comes to [place] from the UC with need for more acute care. She reports falling this morning at home going to UC where they casted and gave her crutches, she fell again while at [facility]. She states "There is no way I can use the crutches, I need an aircast and possibly to be re-scanned since I fell again."  Pain medication given at [facility]. Imaging completed at [facility].

## 2021-07-25 NOTE — ED Triage Notes (Signed)
Pt presents with right ankle and right leg injury after falling this morning.

## 2021-07-25 NOTE — ED Notes (Signed)
Patient is being discharged from the Urgent Care and sent to the Emergency Department via wheelchair . Per Provider Roosvelt Maser, patient is in need of higher level of care due to dislocation & fracture. Patient is aware and verbalizes understanding of plan of care.   Vitals:   07/25/21 1239  BP: 91/65  Pulse: 76  Resp: 17  Temp: 97.6 F (36.4 C)  SpO2: 93%

## 2021-07-25 NOTE — ED Provider Notes (Signed)
[facility] EMERGENCY DEPARTMENT Provider Note   CSN: [id number] Arrival date & time: 07/25/21  1426     History Chief Complaint  Patient presents with   Fall   Ankle Pain    Angela Mahoney is a 60 y.o. female.  The history is provided by the patient and medical records. No language interpreter was used.  Fall  Ankle Pain Associated symptoms: no fever    60 year old female significant history of fibromyalgia, CAD, COPD presenting for evaluation of ankle injury.  Patient states she has had problem with having low blood pressure for quite a while.  Her cardiologist recently started her on a new medication to help with her blood pressure and she has been taking it for the past 2 days.  She mention she still feels lightheadedness and dizziness sometimes with standing.  This morning while she was making her coffee, she felt lightheadedness and subsequently fell with her right leg buckled underneath her body weight.  She felt an acute onset of pain about her ankle and was unable to bear weight afterward.  She went to urgent care to be evaluated, had x-ray of her ankle and was told that it was broken.  A splint was placed and patient was given crutches.  While she was tried on her crutch her leg gave out again and she once again falls to the ground.  She denies hitting her head or loss of consciousness but endorsed increasing pain about the ankle.  She was sent here for further evaluation.  At this time she endorsed sharp throbbing pain about the ankle, moderate in severity without any associated numbness.  Denies any knee pain or hip pain denies any headache or chest pain.  She does not have an orthopedist.  Past Medical History:  Diagnosis Date   COPD (chronic obstructive pulmonary disease) ([place]) 2017   Coronary artery disease    Fibromyalgia     There are no problems to display for this patient.   Past Surgical History:  Procedure Laterality Date   CARDIAC  CATHETERIZATION       OB History   No obstetric history on file.     No family history on file.  Social History   Tobacco Use   Smoking status: Former    Packs/day: 0.50    Years: 45.00    Pack years: 22.50    Types: Cigarettes   Smokeless tobacco: Never  Substance Use Topics   Alcohol use: Never   Drug use: Never    Home Medications Prior to Admission medications   Medication Sig Start Date End Date Taking? Authorizing Provider  acetaminophen (TYLENOL) 325 MG tablet Take 650 mg by mouth 2 (two) times daily as needed for mild pain or headache.    [provider]  albuterol (VENTOLIN HFA) 108 (90 Base) MCG/ACT inhaler Inhale 1-2 puffs into the lungs every 6 (six) hours as needed for shortness of breath or wheezing. 02/07/20   [provider]  ARIPiprazole (ABILIFY) 5 MG tablet Take 5 mg by mouth every morning. 04/18/20   [provider]  ascorbic acid (VITAMIN C) 500 MG tablet Take 500 mg by mouth daily.    [provider]  aspirin EC 81 MG tablet Take 81 mg by mouth daily.    [provider]  atorvastatin (LIPITOR) 80 MG tablet Take 1 tablet by mouth daily. 03/13/20   [provider]  baclofen (LIORESAL) 10 MG tablet Take 1 tablet by mouth daily.  12/23/19   [provider]  busPIRone (BUSPAR) 30 MG tablet Take 1 tablet by mouth 3 (three) times daily. 02/14/20   [provider]  Calcium Citrate-Vitamin D (CALCIUM CITRATE + D PO) Take 1 tablet by mouth daily.    [provider]  DULoxetine (CYMBALTA) 60 MG capsule Take 2 capsules by mouth at bedtime. 02/24/20   [provider]  ergocalciferol (VITAMIN D2) 1.25 MG (50000 UT) capsule Take 50,000 Units by mouth every Sunday.    [provider]  famotidine (PEPCID) 20 MG tablet Take 1 tablet by mouth daily. 02/14/20   [provider]  ferrous sulfate 325 (65 FE) MG tablet Take 325 mg by mouth 2 (two) times a week.    [provider]  fluticasone (FLONASE) 50 MCG/ACT nasal spray Place 1 spray into both nostrils daily as needed for allergies or rhinitis.    [provider]  folic acid (FOLVITE) 562 MCG tablet Take 800 mcg by mouth daily.    [provider]  gabapentin (NEURONTIN) 300 MG capsule Take 600-900 mg by mouth See admin instructions. Take 2 capsules in the morning and dinner time, then take 3 capsules at bedtime 03/06/20   [provider]  HYDROcodone-acetaminophen (NORCO/VICODIN) 5-325 MG tablet Take 1 tablet by mouth 3 (three) times daily as needed for moderate pain. 07/25/21   Volney American, PA-C  levothyroxine (SYNTHROID) 25 MCG tablet Take 1 tablet by mouth daily. 02/24/20   [provider]  metoprolol tartrate (LOPRESSOR) 50 MG tablet Take 25 mg by mouth daily.    [provider]  Multiple Vitamin (MULTIVITAMIN WITH MINERALS) TABS tablet Take 1 tablet by mouth daily.    [provider]  NARCAN 4 MG/0.1ML LIQD nasal spray kit Place 1 spray into the nose as needed for opioid reversal. 12/29/19   [provider]  nitroGLYCERIN (NITROSTAT) 0.4 MG SL tablet Place 0.4 mg under the tongue every 5 (five) minutes as needed for chest pain.    [provider]  ondansetron (ZOFRAN-ODT) 4 MG disintegrating tablet Take 1 tablet by mouth every 8 (eight) hours as needed for nausea/vomiting. 12/29/19   [provider]  ranolazine (RANEXA) 500 MG 12 hr tablet Take 1 tablet by mouth daily. 03/15/20   [provider]    Allergies    Zolpidem  Review of Systems   Review of Systems  Constitutional:  Negative for fever.  Musculoskeletal:  Positive for arthralgias.  Skin:  Negative for wound.  Neurological:  Negative for numbness.   Physical Exam Updated Vital Signs BP (!) 88/65 (BP Location: Left Arm)   Pulse 85   Temp 98.4 F (36.9 C) (Oral)   Resp 14   SpO2 99%   Physical Exam Vitals and nursing note reviewed.   Constitutional:      General: She is not in acute distress.    Appearance: She is well-developed. She is obese.  HENT:     Head: Atraumatic.  Eyes:     Conjunctiva/sclera: Conjunctivae normal.  Pulmonary:     Effort: Pulmonary effort is normal.  Musculoskeletal:        General: Signs of injury (Right lower extremity: Posterior ankle splint is in place.  Tenderness to palpation about the ankle.  Brisk cap refill to the toes with intact dorsalis pedis pulse.) present.     Cervical back: Neck supple.     Comments: Right knee and right hip nontender.  Leg compartments soft  Skin:  Findings: No rash.  Neurological:     Mental Status: She is alert. Mental status is at baseline.  Psychiatric:        Mood and Affect: Mood normal.    ED Results / Procedures / Treatments   Labs (all labs ordered are listed, but only abnormal results are displayed) Labs Reviewed - No data to display  EKG None  Radiology DG Tibia/Fibula Right  Result Date: 07/25/2021 CLINICAL DATA:  Right leg pain after fall today. EXAM: RIGHT TIBIA AND FIBULA - 2 VIEW COMPARISON:  None. FINDINGS: Moderate lateral dislocation of the talus relative to the distal tibia is noted. Moderately displaced fractures are seen involving the distal right fibula and the medial malleolus of the distal tibia. IMPRESSION: Moderate lateral dislocation of the talotibial joint is noted with moderately displaced fractures involving the distal right fibula and medial malleolus of the distal tibia. Electronically Signed   By: Marijo Conception M.D.   On: 07/25/2021 13:45   DG Ankle Complete Right  Result Date: 07/25/2021 CLINICAL DATA:  Pain.  Fall. EXAM: RIGHT ANKLE - COMPLETE 3+ VIEW COMPARISON:  Right tibia and fibula x-ray 07/25/2021. FINDINGS: There is an acute oblique fracture through the medial malleolus. There is 12 mm of posterior and 7 mm of medial displacement of the proximal tibia in relation to the medial malleolus and talar dome.  Additionally there is an oblique fracture through the distal fibula at the level the ankle mortise with mild apex medial angulation. There is a nondisplaced posterior malleolar fracture. Small fracture fragments are seen between the articulation of the distal tibia and distal fibula. Talar dome appears intact. There is soft tissue swelling surrounding the ankle. Orthopedic screws seen in the first metatarsal. IMPRESSION: Trimalleolar ankle fracture with abnormal medial translation of the distal tibia in relation to the talar dome. Electronically Signed   By: Ronney Asters M.D.   On: 07/25/2021 16:11    Procedures Procedures   Medications Ordered in ED Medications - No data to display  ED Course  I have reviewed the triage vital signs and the nursing notes.  Pertinent labs & imaging results that were available during my care of the patient were reviewed by me and considered in my medical decision making (see chart for details).    MDM Rules/Calculators/A&P                           BP 101/73   Pulse 70   Temp 98.4 F (36.9 C) (Oral)   Resp 16   SpO2 99%   Final Clinical Impression(s) / ED Diagnoses Final diagnoses:  Fracture of ankle, trimalleolar, right, closed, initial encounter    Rx / DC Orders ED Discharge Orders          Ordered    HYDROcodone-acetaminophen (NORCO/VICODIN) 5-325 MG tablet  Every 6 hours PRN        07/25/21 1931           Patient with history of hypertension who fell earlier today and injuring her right ankle.  Initially was seen at urgent care center had an x-ray that demonstrated Moderate lateral dislocation of the talotibial joint is noted with moderately displaced fractures involving the distal right fibula and medial malleolus of the distal tibia.  She had a posterior splint placed and while trying to use a set of crutches she fell again injuring the same ankle.  On repeat x-ray in the ER demonstrate a  trimalleolar ankle fracture with abnormal  medial translation of the distal tibia in relation to the talar dome.  Patient is neurovascular intact.  She last ate at 10 AM.  She is still in some moderate pain and her blood pressure is currently at 88/65.  Will consult orthopedist for recommendation.  5:23 PM Appreciate consultation from on-call orthopedist, Dr. Doreatha Martin, who have reviewed the x-ray.  I agrees that patient would likely benefit from surgical management sometime next week.  He agreed with splinting.  Attempts to manipulate ankle after patient received pain medication however due to her intolerance of discomfort, will provide wheelchair and have patient follow-up outpatient with orthopedist for surgical management.  Patient discharged home with pain medication.  Patient made aware that pain medication can potentially cause her blood pressure drops lower than it was so she has to monitor it very carefully.  Patient voiced understanding and agrees.   Domenic Moras, PA-C 07/25/21 [place], [place] A, DO 07/25/21 2357

## 2021-07-25 NOTE — Care Management (Signed)
ED RN Care Manager. Received consult for DME w/c.  Met with patient and husband at bedside to discuss recommendation for DME, patient states, she tried to use crutches but was unable to use them, she is non-weight bearing.  This patient would benefit from w/c to aid in perform her ADL safely.  Patient is agreeable with plan, explained to patient that the w/c would be ordered tonight and should be delivered by tomorrow from [place]. Patient is ok with going home and receiving the w/c tomorrow. Updated EDP and RN. Order was places in [place]. Patient being discharged home with family transported in private vehicle to follow up with Ortho. No further ED TOC needs identified.

## 2021-07-28 ENCOUNTER — Telehealth: Payer: Self-pay | Admitting: Surgery

## 2021-07-28 NOTE — Telephone Encounter (Signed)
ED RN Care Manager received call from patient stating she has not received her wheelchair. Contacted [employer] and they are in the process of delivering wheelchair today. Updated patient and patient verbalized understanding. No further ED TOC needs identified

## 2021-07-30 ENCOUNTER — Encounter (HOSPITAL_COMMUNITY): Payer: Self-pay | Admitting: Student

## 2021-07-30 NOTE — Progress Notes (Addendum)
Anesthesia Chart Review: Angela Mahoney   Case: [id number] Date/Time: 07/31/21 0715   Procedure: External Fixation Ankle (Right)   Anesthesia type: Choice   Pre-op diagnosis: ankle fracture   Location: [facility] OR ROOM 03 / [facility] OR   Surgeons: Shona Needles, MD       DISCUSSION: Patient is a 60 year old female scheduled for the above procedure. She had a mechanical fall 07/25/21 and sustained a right ankle fracture. Dr. Doreatha Martin contacted. Leg splinted and surgical management the following week planned.   History includes former smoker (quit 11/16/12), COPD, exertional dyspnea, CAD, OSA (does not use CPAP), pre-diabetes, GERD, fibromyalgia, hypothyroidism, gastric sleeve resection (~ 2017), syncope (recurrent?; ED evaluation at [facility] 04/03/20. Reported possibly due to hypotension, started on Florinef).   Patient is a same day work-up, first case for tomorrow 07/31/21. ED visit 07/25/21 for acute ankle fracture. PAT RN completed preoperative instructions and history with patient and daughter. Reportedly patient had cardiac work-up for syncope in [place], [place]. This included a cardiac cath (01/23/16?). Patient mentioned "Widow Maker" although she did not recall having to undergo PCI. She is on Ranexa. She denied chest pain. She does have chronic DOE. BP medications had to be discontinued due to hypotension /syncope. She is on Florinef. She denied NItro use. Her CAD is now being managed by Roque Cash, PA-C at [facility]. She had a stress and echo within the past three months.  [facility] contacted for urgent request of medical records ~ 3:40 PM. PAT RN also to instruct patient to any medical records that she has with her for surgery tomorrow, so copies can be made if needed. Still awaiting records as of 4:51 PM on 07/30/21. I contacted [facility] and asked to speak with cardiology provider or RN. I was placed on hold for over 10 minutes until phone transitioned to the answering service.  The answering service also tried to reconnect me to cardiology but there was no answer after another 5 minutes. If more information is needed then on-call provider may have to be contacted on the day of surgery.  [facility] phone number is [phone].    ADDENDUM 07/31/21 9:17 AM: August 2022 stress test and echo received from [facility]. See below CV. 07/17/21 office visit from Roque Cash, PA-C reviewed. Baclofen decreased to 5 mg at bedtime only and metoprolol discontinued due to dizziness with history of syncope/hypotension. Advised to not skip meals while taking Ozempic to avoid hypoglycemia. Florinef added for hypotension. One month follow-up planned.    VS:  BP Readings from Last 3 Encounters:  07/25/21 101/73  07/25/21 91/65  04/24/20 104/70   Pulse Readings from Last 3 Encounters:  07/25/21 70  07/25/21 76  04/24/20 74     PROVIDERS: Center[facility] is where she receives primary and cardiology care. She sees Fredrich Romans, PA-C (PCP) and Roque Cash, PA-C (cardiology).    LABS: For day of surgery.    IMAGES: Xray right ankle 07/25/21: IMPRESSION: Trimalleolar ankle fracture with abnormal medial translation of the distal tibia in relation to the talar dome.  Xray right Tib/Fib 07/25/21: IMPRESSION: Moderate lateral dislocation of the talotibial joint is noted with moderately displaced fractures involving the distal right fibula and medial malleolus of the distal tibia.   EKG:  EKG 07/31/21:  Normal sinus rhythm Low voltage QRS ST & T wave abnormality, consider inferior ischemia ST & T wave abnormality, consider anterior ischemia Abnormal ECG   CV: Echo  07/15/21 ([place]): Conclusions 1.  Normal study: Normal cardiac chamber sizes and function; LVEF 55 to 60%.  Normal valve anatomy and function; no pericardial effusion or intracardiac mass.  No intracardiac shunts by 2D and color-flow imaging.  Normal thoracic aorta and aortic  arch.   Nuclear stress test 06/19/21 ([place]): Conclusions 1.  The resting ECG shows normal sinus rhythm.  The resting and stress ECG shows normal ST segment and no ventricular tachycardia, significant QRS prolongation or heart block.  Both rest and stress images are within normal limits.  No significant reversible ischemia or fixed scar.  Gated left ventricular ejection fraction is normal (> or = to 67%).  Normal LV segmental wall motion.   She reportedly had a cardiac cath on 01/23/16 at [facility] in [place], [place]. She did not recall a PCI but mentioned "Widow Maker".  Past Medical History:  Diagnosis Date   Anxiety    Arthritis    COPD (chronic obstructive pulmonary disease) ([place]) 2017   Coronary artery disease    Isaias Cowman, PA   Depression    Dyspnea    with exertion   Fibromyalgia    GERD (gastroesophageal reflux disease)    Headache    History of fainting spells of unknown cause    ? r/t low blood pressure per daughter   Hypothyroidism    Pre-diabetes    diet controlled - no meds   Seasonal allergies    Sleep apnea    does not use cpap    Past Surgical History:  Procedure Laterality Date   BUNIONECTOMY Right 2002   CARDIAC CATHETERIZATION  01/23/2016   In [place]     x 2 at ages 28yrand 62yr [place]   LAPAROSCOPIC GASTRIC SLEEVE RESECTION  2017   [place]   has upper and lower dentures    MEDICATIONS: No current facility-administered medications for this encounter.    albuterol (VENTOLIN HFA) 108 (90 Base) MCG/ACT inhaler   ARIPiprazole (ABILIFY) 5 MG tablet   Ascorbic Acid (VITAMIN C-ROSE HIPS CR) 1500 MG TBCR   aspirin EC 81 MG tablet   atorvastatin (LIPITOR) 80 MG tablet   b complex vitamins capsule   Baclofen 5 MG TABS   busPIRone (BUSPAR) 15 MG tablet   Calcium Citrate-Vitamin D (CALCIUM CITRATE + D PO)   DULoxetine (CYMBALTA) 60 MG capsule   famotidine (PEPCID) 10 MG tablet    fludrocortisone (FLORINEF) 0.1 MG tablet   fluticasone (FLONASE) 50 MCG/ACT nasal spray   gabapentin (NEURONTIN) 300 MG capsule   glucosamine-chondroitin 500-400 MG tablet   HYDROcodone-acetaminophen (NORCO/VICODIN) 5-325 MG tablet   levocetirizine (XYZAL) 5 MG tablet   Multiple Vitamin (MULTIVITAMIN WITH MINERALS) TABS tablet   NARCAN 4 MG/0.1ML LIQD nasal spray kit   nitroGLYCERIN (NITROSTAT) 0.4 MG SL tablet   ondansetron (ZOFRAN-ODT) 4 MG disintegrating tablet   phentermine (ADIPEX-P) 37.5 MG tablet   topiramate (TOPAMAX) 50 MG tablet   OZEMPIC, 1 MG/DOSE, 4 MG/3ML SOPN    AlMyra GianottiPA-C Surgical Short Stay/Anesthesiology MCPiedmont Newton Hospitalhone ([phone] Surgery Centerhone ([phone]/14/2022 5:10 PM

## 2021-07-30 NOTE — Progress Notes (Signed)
DUE TO COVID-19 ONLY ONE VISITOR IS ALLOWED TO COME WITH YOU AND STAY IN THE WAITING ROOM ONLY DURING PRE OP AND PROCEDURE DAY OF SURGERY.   PCP - Wilford Grist, PA @ [facility]  Cardiologist - Roxanne Mins, PA-C @ [facility]  Chest x-ray - 05/2021 EKG - 05/2021 Stress Test - 05/2021 ECHO - 06/2021 Cardiac Cath - 01/23/16  ICD Pacemaker/Loop - n/a  Sleep Study -  Yes CPAP - does not use cpap  Aspirin Instructions: Ok to take ASA per Garfield Cornea at [facility] office via phone.    Anesthesia review: Yes  STOP now taking any Aspirin (unless otherwise instructed by your surgeon), Aleve, Naproxen, Ibuprofen, Motrin, Advil, Goody's, BC's, all herbal medications, fish oil, and all vitamins.   Coronavirus Screening Covid test n/a - Ambulatory Surgery  Do you have any of the following symptoms:  Cough yes/no: No Fever (>100.26F)  yes/no: No Runny nose yes/no: No Sore throat yes/no: No Difficulty breathing/shortness of breath  yes/no: No  Have you traveled in the last 14 days and where? yes/no: No  Patient and Daughter Aggie Cosier verbalized understanding of instructions that were given via phone.

## 2021-07-30 NOTE — Anesthesia Preprocedure Evaluation (Addendum)
Anesthesia Evaluation  Patient identified by MRN, date of birth, ID band Patient awake    Reviewed: Allergy & Precautions, NPO status , Patient's Chart, lab work & pertinent test results  Airway Mallampati: II       Dental  (+) Edentulous Upper, Edentulous Lower   Pulmonary sleep apnea , COPD,  COPD inhaler, former smoker,    Pulmonary exam normal        Cardiovascular + CAD and + Past MI  Normal cardiovascular exam     Neuro/Psych    GI/Hepatic   Endo/Other    Renal/GU      Musculoskeletal   Abdominal (+) + obese,   Peds  Hematology   Anesthesia Other Findings   Reproductive/Obstetrics                          Anesthesia Physical Anesthesia Plan  ASA: 3  Anesthesia Plan: General   Post-op Pain Management:  Regional for Post-op pain   Induction:   PONV Risk Score and Plan: 2 and Ondansetron, Dexamethasone, Propofol infusion and Midazolam  Airway Management Planned: LMA  Additional Equipment: None  Intra-op Plan:   Post-operative Plan: Extubation in OR  Informed Consent: I have reviewed the patients History and Physical, chart, labs and discussed the procedure including the risks, benefits and alternatives for the proposed anesthesia with the patient or authorized representative who has indicated his/her understanding and acceptance.     Dental advisory given  Plan Discussed with: CRNA  Anesthesia Plan Comments: (See PAT note written 07/30/2021 by Shonna Chock, PA-C. Patient is a SAME DAY WORK-UP. Multiple phone calls to [facility] on 07/30/21 to request recent stress test, echo, office note. Patient was also asked to bring any additional records that she may have. )      Anesthesia Quick Evaluation

## 2021-07-30 NOTE — H&P (Signed)
Orthopaedic Trauma Service (OTS) Consult   Patient ID: Angela Mahoney MRN: [id number] DOB/AGE: [birth year] 60 y.o.    HPI: Angela Mahoney is an 60 y.o. female with history of COPD CAD, fibromyalgia, thyroid disease sustained a ground-level fall while using her walker with resultant right ankle fracture.  Patient was brought to [facility] on 07/25/2021 where she was found to have a right trimalleolar ankle fracture with subluxation of her talus.  Patient was splinted in a posterior splint.  She followed up with orthopedic trauma specialist on 07/30/2021 where follow-up x-rays in the office demonstrated complete dislocation of her talus.  Patient denies any weightbearing on her right leg.  She has been in a wheelchair since discharge from the hospital other than transferring from bed to chair and toilet.  Splint was removed in the office which was notable for deformity to her right ankle along with fracture blisters.  She has profound swelling which will likely preclude early fixation.  X-rays were also noted to have a disruption of her syndesmosis.  We did discuss the nature of her injury and the need to place her into a external fixator to reduce her talus relative to the tibial plafond as well as to allow for local wound care to maximize swelling control prior to definitive fixation.  Patient is in agreement with this and we plan on proceeding to the OR for application of a spanning external fixator.  Chronically patient does have some issues with hypotension this is currently being worked up.  She does use a walker at baseline. She does have CAD s/p catheterization  Takes vitamin D and calcium but no bone density scan has been done recently  Past Medical History:  Diagnosis Date   COPD (chronic obstructive pulmonary disease) ([place]) 2017   Coronary artery disease    Fibromyalgia     Past Surgical History:  Procedure Laterality Date   CARDIAC CATHETERIZATION       No family history on file.  Social History:  reports that she has quit smoking. Her smoking use included cigarettes. She has a 22.50 pack-year smoking history. She has never used smokeless tobacco. She reports that she does not drink alcohol and does not use drugs.  Allergies:  Allergies  Allergen Reactions   Zolpidem Swelling    Tongue swelling, thrush symptoms.    Medications: I have reviewed the patient's current medications. Current Meds  Medication Sig   albuterol (VENTOLIN HFA) 108 (90 Base) MCG/ACT inhaler Inhale 1-2 puffs into the lungs every 6 (six) hours as needed for shortness of breath or wheezing.   ARIPiprazole (ABILIFY) 5 MG tablet Take 5 mg by mouth every morning.   Ascorbic Acid (VITAMIN C-ROSE HIPS CR) 1500 MG TBCR Take 1,500 mg by mouth daily.   aspirin EC 81 MG tablet Take 81 mg by mouth daily.   atorvastatin (LIPITOR) 80 MG tablet Take 1 tablet by mouth daily with supper.   b complex vitamins capsule Take 1 capsule by mouth daily.   Baclofen 5 MG TABS Take 1 tablet by mouth at bedtime.   busPIRone (BUSPAR) 15 MG tablet Take 15 mg by mouth 3 (three) times daily.   Calcium Citrate-Vitamin D (CALCIUM CITRATE + D PO) Take 1 tablet by mouth daily.   DULoxetine (CYMBALTA) 60 MG capsule Take 120 capsules by mouth at bedtime.   famotidine (PEPCID) 10 MG tablet Take 10 mg by mouth daily with breakfast.   fludrocortisone (FLORINEF)  0.1 MG tablet Take 0.1 mg by mouth daily.   fluticasone (FLONASE) 50 MCG/ACT nasal spray Place 1 spray into both nostrils daily as needed for allergies or rhinitis.   gabapentin (NEURONTIN) 300 MG capsule Take 600-900 mg by mouth See admin instructions. Take 3 capsules in the morning and 2 capsules dinner time, then take 3 capsules at bedtime.   glucosamine-chondroitin 500-400 MG tablet Take 1 tablet by mouth daily.   HYDROcodone-acetaminophen (NORCO/VICODIN) 5-325 MG tablet Take 1 tablet by mouth every 6 (six) hours as needed for moderate  pain.   levocetirizine (XYZAL) 5 MG tablet Take 5 mg by mouth daily.   Multiple Vitamin (MULTIVITAMIN WITH MINERALS) TABS tablet Take 1 tablet by mouth daily.   NARCAN 4 MG/0.1ML LIQD nasal spray kit Place 1 spray into the nose as needed for opioid reversal.   nitroGLYCERIN (NITROSTAT) 0.4 MG SL tablet Place 0.4 mg under the tongue every 5 (five) minutes as needed for chest pain.   ondansetron (ZOFRAN-ODT) 4 MG disintegrating tablet Take 1 tablet by mouth every 8 (eight) hours as needed for nausea/vomiting.   phentermine (ADIPEX-P) 37.5 MG tablet Take 37.5 mg by mouth daily before breakfast.   topiramate (TOPAMAX) 50 MG tablet Take 50 mg by mouth at bedtime. Prescribed for weight loss   [DISCONTINUED] famotidine (PEPCID) 20 MG tablet Take 20 mg by mouth daily with breakfast.   [DISCONTINUED] ranolazine (RANEXA) 500 MG 12 hr tablet Take 500 mg by mouth daily.     No results found for this or any previous visit (from the past 48 hour(s)).  No results found.  Intake/Output    None      Review of Systems  Constitutional:  Negative for chills and fever.  Eyes:  Negative for blurred vision.  Respiratory:  Negative for shortness of breath and wheezing.   Cardiovascular:  Negative for chest pain and palpitations.  Gastrointestinal:  Negative for nausea and vomiting.  Musculoskeletal:  Positive for joint pain (R ankle).  Neurological:  Negative for tingling and sensory change.  Psychiatric/Behavioral:  Negative for substance abuse.   There were no vitals taken for this visit. Physical Exam Vitals reviewed.  Constitutional:      General: She is not in acute distress.    Appearance: She is obese.  Cardiovascular:     Heart sounds: S1 normal and S2 normal.  Pulmonary:     Comments: , No accessory muscle usage Musculoskeletal:     Comments: Right Lower Extremity  Marked swelling to the right ankle.  Skin really does not wrinkle over the lateral or medial aspect of the ankle. Blistering  is present both medially and laterally Compartments are soft No pain out of proportion with passive stretching of her toes No pressure sores Knee is nontender + DP pulse DPN, SPN, TN sensory functions grossly intact EHL, FHL and lesser toe motor functions grossly intact  Skin:    General: Skin is warm.     Capillary Refill: Capillary refill takes less than 2 seconds.  Neurological:     Mental Status: She is alert and oriented to person, place, and time.  Psychiatric:        Attention and Perception: Attention normal.        Mood and Affect: Mood and affect normal.        Behavior: Behavior is cooperative.     Assessment/Plan:  60 year old female ground-level fall with right trimalleolar ankle fracture dislocation, walker dependent  -Right trimalleolar ankle fracture dislocation, syndesmotic disruption  OR for application of a spanning external fixator  Delayed ORIF right ankle and syndesmosis  Nonweightbearing right leg postop   Outpatient versus overnight observation   Patient's mobility will not change postoperatively so do think it is feasible for her to discharge home after surgery.  However formal therapy evaluation postop could be beneficial as external fixator is a little bit of a cumbersome device to maneuver with  - Pain management:  Multimodal  Minimize narcotics  - Metabolic Bone Disease:  Check vitamin D levels  Fracture mechanism suggestive of poor bone quality  Recommend bone density scan in the next 4 to 8 weeks  Is imperative to prevent secondary fractures given her restricted mobility already  - Activity:  Nonweightbearing right leg  - FEN/GI prophylaxis/Foley/Lines:  NPO   Advance diet postoperatively  - Dispo:  OR for application of external fixator right ankle  Jari Pigg, PA-C [phone] (C) 07/30/2021, 12:35 PM  [facility] [place] [phone] Jenetta [phone] (F)    After 5pm  and on the weekends please log on to Amion, go to orthopaedics and the look under the [facility] Call for the provider(s) on call. You can also call our office at [phone] and then follow the prompts to be connected to the call team.

## 2021-07-31 ENCOUNTER — Encounter (HOSPITAL_COMMUNITY)
Admission: RE | Disposition: A | Discharge status: Skilled Nursing Facility | Payer: Self-pay | Source: Home / Self Care | Attending: Student

## 2021-07-31 ENCOUNTER — Inpatient Hospital Stay (HOSPITAL_COMMUNITY): Payer: Medicare (Managed Care) | Admitting: Vascular Surgery

## 2021-07-31 ENCOUNTER — Other Ambulatory Visit: Payer: Self-pay

## 2021-07-31 ENCOUNTER — Inpatient Hospital Stay (HOSPITAL_COMMUNITY): Payer: Medicare (Managed Care)

## 2021-07-31 ENCOUNTER — Encounter (HOSPITAL_COMMUNITY): Payer: Self-pay | Admitting: Student

## 2021-07-31 ENCOUNTER — Inpatient Hospital Stay (HOSPITAL_COMMUNITY)
Admission: RE | Admit: 2021-07-31 | Discharge: 2021-08-08 | DRG: 493 | Disposition: A | Discharge status: Skilled Nursing Facility | Payer: Medicare (Managed Care) | Attending: Student | Admitting: Student

## 2021-07-31 ENCOUNTER — Ambulatory Visit (HOSPITAL_COMMUNITY): Payer: Medicare (Managed Care)

## 2021-07-31 DIAGNOSIS — Z7982 Long term (current) use of aspirin: Secondary | ICD-10-CM | POA: Diagnosis not present

## 2021-07-31 DIAGNOSIS — I252 Old myocardial infarction: Secondary | ICD-10-CM

## 2021-07-31 DIAGNOSIS — S82851A Displaced trimalleolar fracture of right lower leg, initial encounter for closed fracture: Secondary | ICD-10-CM | POA: Diagnosis present

## 2021-07-31 DIAGNOSIS — I2510 Atherosclerotic heart disease of native coronary artery without angina pectoris: Secondary | ICD-10-CM | POA: Diagnosis present

## 2021-07-31 DIAGNOSIS — R7303 Prediabetes: Secondary | ICD-10-CM | POA: Diagnosis present

## 2021-07-31 DIAGNOSIS — Z6837 Body mass index (BMI) 37.0-37.9, adult: Secondary | ICD-10-CM | POA: Diagnosis not present

## 2021-07-31 DIAGNOSIS — E876 Hypokalemia: Secondary | ICD-10-CM | POA: Diagnosis present

## 2021-07-31 DIAGNOSIS — D62 Acute posthemorrhagic anemia: Secondary | ICD-10-CM | POA: Diagnosis not present

## 2021-07-31 DIAGNOSIS — W1830XA Fall on same level, unspecified, initial encounter: Secondary | ICD-10-CM | POA: Diagnosis present

## 2021-07-31 DIAGNOSIS — E039 Hypothyroidism, unspecified: Secondary | ICD-10-CM | POA: Diagnosis present

## 2021-07-31 DIAGNOSIS — F32A Depression, unspecified: Secondary | ICD-10-CM | POA: Diagnosis present

## 2021-07-31 DIAGNOSIS — T148XXA Other injury of unspecified body region, initial encounter: Secondary | ICD-10-CM

## 2021-07-31 DIAGNOSIS — Z79899 Other long term (current) drug therapy: Secondary | ICD-10-CM | POA: Diagnosis not present

## 2021-07-31 DIAGNOSIS — Z87891 Personal history of nicotine dependence: Secondary | ICD-10-CM | POA: Diagnosis not present

## 2021-07-31 DIAGNOSIS — Z20822 Contact with and (suspected) exposure to covid-19: Secondary | ICD-10-CM | POA: Diagnosis present

## 2021-07-31 DIAGNOSIS — J449 Chronic obstructive pulmonary disease, unspecified: Secondary | ICD-10-CM | POA: Diagnosis present

## 2021-07-31 DIAGNOSIS — M25571 Pain in right ankle and joints of right foot: Secondary | ICD-10-CM | POA: Diagnosis present

## 2021-07-31 DIAGNOSIS — F419 Anxiety disorder, unspecified: Secondary | ICD-10-CM | POA: Diagnosis present

## 2021-07-31 DIAGNOSIS — M797 Fibromyalgia: Secondary | ICD-10-CM | POA: Diagnosis present

## 2021-07-31 DIAGNOSIS — K219 Gastro-esophageal reflux disease without esophagitis: Secondary | ICD-10-CM | POA: Diagnosis present

## 2021-07-31 DIAGNOSIS — Z419 Encounter for procedure for purposes other than remedying health state, unspecified: Secondary | ICD-10-CM

## 2021-07-31 HISTORY — DX: Gastro-esophageal reflux disease without esophagitis: K21.9

## 2021-07-31 HISTORY — DX: Repeated falls: R29.6

## 2021-07-31 HISTORY — DX: Unspecified fall, initial encounter: W19.XXXA

## 2021-07-31 HISTORY — DX: Headache, unspecified: R51.9

## 2021-07-31 HISTORY — DX: Anxiety disorder, unspecified: F41.9

## 2021-07-31 HISTORY — DX: Other seasonal allergic rhinitis: J30.2

## 2021-07-31 HISTORY — DX: Dyspnea, unspecified: R06.00

## 2021-07-31 HISTORY — DX: Depression, unspecified: F32.A

## 2021-07-31 HISTORY — DX: Unspecified osteoarthritis, unspecified site: M19.90

## 2021-07-31 HISTORY — DX: Sleep apnea, unspecified: G47.30

## 2021-07-31 HISTORY — DX: Other specified personal risk factors, not elsewhere classified: Z91.89

## 2021-07-31 HISTORY — DX: Prediabetes: R73.03

## 2021-07-31 HISTORY — DX: Hypothyroidism, unspecified: E03.9

## 2021-07-31 LAB — COMPREHENSIVE METABOLIC PANEL
ALT: 26 U/L (ref 0–44)
AST: 29 U/L (ref 15–41)
Albumin: 3.3 g/dL — ABNORMAL LOW (ref 3.5–5.0)
Alkaline Phosphatase: 83 U/L (ref 38–126)
Anion gap: 13 (ref 5–15)
BUN: 11 mg/dL (ref 8–23)
CO2: 26 mmol/L (ref 22–32)
Calcium: 9.6 mg/dL (ref 8.9–10.3)
Chloride: 102 mmol/L (ref 98–111)
Creatinine, Ser: 0.88 mg/dL (ref 0.44–1.00)
GFR, Estimated: >60 mL/min (ref >60)
Glucose, Bld: 98 mg/dL (ref 70–99)
Potassium: 3.5 mmol/L (ref 3.5–5.1)
Sodium: 141 mmol/L (ref 135–145)
Total Bilirubin: 0.8 mg/dL (ref 0.3–1.2)
Total Protein: 6.4 g/dL — ABNORMAL LOW (ref 6.5–8.1)

## 2021-07-31 LAB — CBC
HCT: 37.7 % (ref 36.0–46.0)
Hemoglobin: 12.3 g/dL (ref 12.0–15.0)
MCH: 30 pg (ref 26.0–34.0)
MCHC: 32.6 g/dL (ref 30.0–36.0)
MCV: 92 fL (ref 80.0–100.0)
Platelets: 242 10*3/uL (ref 150–400)
RBC: 4.1 MIL/uL (ref 3.87–5.11)
RDW: 13.5 % (ref 11.5–15.5)
WBC: 4.1 10*3/uL (ref 4.0–10.5)
nRBC: 0 % (ref 0.0–0.2)

## 2021-07-31 LAB — CBC WITH DIFFERENTIAL/PLATELET
Abs Immature Granulocytes: 0.02 10*3/uL (ref 0.00–0.07)
Basophils Absolute: 0.1 10*3/uL (ref 0.0–0.1)
Basophils Relative: 1 %
Eosinophils Absolute: 0.2 10*3/uL (ref 0.0–0.5)
Eosinophils Relative: 3 %
HCT: 39.7 % (ref 36.0–46.0)
Hemoglobin: 12.7 g/dL (ref 12.0–15.0)
Immature Granulocytes: 0 %
Lymphocytes Relative: 30 %
Lymphs Abs: 1.5 10*3/uL (ref 0.7–4.0)
MCH: 29.5 pg (ref 26.0–34.0)
MCHC: 32 g/dL (ref 30.0–36.0)
MCV: 92.1 fL (ref 80.0–100.0)
Monocytes Absolute: 0.6 10*3/uL (ref 0.1–1.0)
Monocytes Relative: 12 %
Neutro Abs: 2.8 10*3/uL (ref 1.7–7.7)
Neutrophils Relative %: 54 %
Platelets: 234 10*3/uL (ref 150–400)
RBC: 4.31 MIL/uL (ref 3.87–5.11)
RDW: 13.7 % (ref 11.5–15.5)
WBC: 5.1 10*3/uL (ref 4.0–10.5)
nRBC: 0 % (ref 0.0–0.2)

## 2021-07-31 LAB — SURGICAL PCR SCREEN
MRSA, PCR: NEGATIVE
Staphylococcus aureus: NEGATIVE

## 2021-07-31 LAB — SARS CORONAVIRUS 2 BY RT PCR (HOSPITAL ORDER, PERFORMED IN ~~LOC~~ HOSPITAL LAB): SARS Coronavirus 2: NEGATIVE

## 2021-07-31 LAB — VITAMIN D 25 HYDROXY (VIT D DEFICIENCY, FRACTURES): Vit D, 25-Hydroxy: 46.62 ng/mL (ref 30–100)

## 2021-07-31 LAB — CREATININE, SERUM
Creatinine, Ser: 0.81 mg/dL (ref 0.44–1.00)
GFR, Estimated: >60 mL/min (ref >60)

## 2021-07-31 SURGERY — EXTERNAL FIXATION, ANKLE
Anesthesia: Regional | Laterality: Right

## 2021-07-31 MED ORDER — CEFAZOLIN IN SODIUM CHLORIDE 3-0.9 GM/100ML-% IV SOLN
3.0000 g | INTRAVENOUS | Status: AC
  Administered 2021-07-31: 2 g via INTRAVENOUS
  Filled 2021-07-31: qty 100

## 2021-07-31 MED ORDER — HYDROMORPHONE HCL 1 MG/ML IJ SOLN
INTRAMUSCULAR | Status: AC
  Filled 2021-07-31: qty 1

## 2021-07-31 MED ORDER — LACTATED RINGERS IV SOLN: INTRAVENOUS | Status: DC

## 2021-07-31 MED ORDER — ROPIVACAINE HCL 7.5 MG/ML IJ SOLN
INTRAMUSCULAR | Status: DC | PRN
  Administered 2021-07-31 (×4): 5 mL via PERINEURAL

## 2021-07-31 MED ORDER — BACLOFEN 10 MG PO TABS
5.0000 mg | ORAL_TABLET | Freq: Every day | ORAL | Status: DC
  Administered 2021-07-31 – 2021-08-07 (×8): 5 mg via ORAL
  Filled 2021-07-31 (×8): qty 1

## 2021-07-31 MED ORDER — MIDAZOLAM HCL 2 MG/2ML IJ SOLN
INTRAMUSCULAR | Status: AC
  Filled 2021-07-31: qty 2

## 2021-07-31 MED ORDER — MIDAZOLAM HCL 2 MG/2ML IJ SOLN
INTRAMUSCULAR | Status: AC
  Administered 2021-07-31: 1 mg
  Filled 2021-07-31: qty 2

## 2021-07-31 MED ORDER — GABAPENTIN 100 MG PO CAPS
200.0000 mg | ORAL_CAPSULE | Freq: Every day | ORAL | Status: DC
  Administered 2021-08-01 – 2021-08-07 (×7): 200 mg via ORAL
  Filled 2021-07-31 (×7): qty 2

## 2021-07-31 MED ORDER — ALBUTEROL SULFATE (2.5 MG/3ML) 0.083% IN NEBU: 2.5000 mg | INHALATION_SOLUTION | Freq: Four times a day (QID) | RESPIRATORY_TRACT | Status: DC | PRN

## 2021-07-31 MED ORDER — ALBUTEROL SULFATE HFA 108 (90 BASE) MCG/ACT IN AERS: 1.0000 | INHALATION_SPRAY | Freq: Four times a day (QID) | RESPIRATORY_TRACT | Status: DC | PRN

## 2021-07-31 MED ORDER — FENTANYL CITRATE (PF) 100 MCG/2ML IJ SOLN
100.0000 ug | Freq: Once | INTRAMUSCULAR | Status: AC
  Filled 2021-07-31: qty 2

## 2021-07-31 MED ORDER — METOCLOPRAMIDE HCL 5 MG PO TABS
5.0000 mg | ORAL_TABLET | Freq: Three times a day (TID) | ORAL | Status: DC | PRN
Start: 2021-07-31 — End: 2021-08-06

## 2021-07-31 MED ORDER — 0.9 % SODIUM CHLORIDE (POUR BTL) OPTIME
TOPICAL | Status: DC | PRN
  Administered 2021-07-31: 1000 mL

## 2021-07-31 MED ORDER — METOCLOPRAMIDE HCL 5 MG/ML IJ SOLN: 5.0000 mg | Freq: Three times a day (TID) | INTRAMUSCULAR | Status: DC | PRN

## 2021-07-31 MED ORDER — CLONIDINE HCL (ANALGESIA) 100 MCG/ML EP SOLN
EPIDURAL | Status: DC | PRN
  Administered 2021-07-31: 100 ug

## 2021-07-31 MED ORDER — PROPOFOL 10 MG/ML IV BOLUS
INTRAVENOUS | Status: DC | PRN
  Administered 2021-07-31: 150 mg via INTRAVENOUS

## 2021-07-31 MED ORDER — CHLORHEXIDINE GLUCONATE 0.12 % MT SOLN
15.0000 mL | Freq: Once | OROMUCOSAL | Status: AC
  Administered 2021-07-31: 15 mL via OROMUCOSAL
  Filled 2021-07-31: qty 15

## 2021-07-31 MED ORDER — PROPOFOL 10 MG/ML IV BOLUS
INTRAVENOUS | Status: AC
  Filled 2021-07-31: qty 40

## 2021-07-31 MED ORDER — PHENYLEPHRINE HCL-NACL 20-0.9 MG/250ML-% IV SOLN
INTRAVENOUS | Status: DC | PRN
  Administered 2021-07-31: 20 ug/min via INTRAVENOUS

## 2021-07-31 MED ORDER — LIDOCAINE 2% (20 MG/ML) 5 ML SYRINGE
INTRAMUSCULAR | Status: DC | PRN
  Administered 2021-07-31: 100 mg via INTRAVENOUS

## 2021-07-31 MED ORDER — BUSPIRONE HCL 5 MG PO TABS
15.0000 mg | ORAL_TABLET | Freq: Three times a day (TID) | ORAL | Status: DC
  Administered 2021-07-31 – 2021-08-08 (×22): 15 mg via ORAL
  Filled 2021-07-31 (×5): qty 1
  Filled 2021-07-31: qty 3
  Filled 2021-07-31: qty 1
  Filled 2021-07-31 (×2): qty 3
  Filled 2021-07-31: qty 1
  Filled 2021-07-31: qty 3
  Filled 2021-07-31 (×2): qty 1
  Filled 2021-07-31 (×3): qty 3
  Filled 2021-07-31 (×11): qty 1
  Filled 2021-07-31: qty 3
  Filled 2021-07-31: qty 1
  Filled 2021-07-31: qty 3
  Filled 2021-07-31 (×2): qty 1
  Filled 2021-07-31: qty 3

## 2021-07-31 MED ORDER — GABAPENTIN 300 MG PO CAPS
600.0000 mg | ORAL_CAPSULE | ORAL | Status: DC
Start: 2021-07-31 — End: 2021-07-31

## 2021-07-31 MED ORDER — DEXAMETHASONE SODIUM PHOSPHATE 10 MG/ML IJ SOLN
INTRAMUSCULAR | Status: DC | PRN
  Administered 2021-07-31: 10 mg via INTRAVENOUS

## 2021-07-31 MED ORDER — VANCOMYCIN HCL 1000 MG IV SOLR
INTRAVENOUS | Status: AC
  Filled 2021-07-31: qty 20

## 2021-07-31 MED ORDER — HYDROMORPHONE HCL 1 MG/ML IJ SOLN: 0.2500 mg | INTRAMUSCULAR | Status: DC | PRN

## 2021-07-31 MED ORDER — HYDROCODONE-ACETAMINOPHEN 5-325 MG PO TABS
ORAL_TABLET | ORAL | Status: AC
  Filled 2021-07-31: qty 1

## 2021-07-31 MED ORDER — ONDANSETRON HCL 4 MG/2ML IJ SOLN: 4.0000 mg | Freq: Four times a day (QID) | INTRAMUSCULAR | Status: DC | PRN

## 2021-07-31 MED ORDER — DULOXETINE HCL 60 MG PO CPEP
120.0000 mg | ORAL_CAPSULE | Freq: Every day | ORAL | Status: DC
  Administered 2021-07-31 – 2021-08-07 (×8): 120 mg via ORAL
  Filled 2021-07-31 (×9): qty 2

## 2021-07-31 MED ORDER — FLUTICASONE PROPIONATE 50 MCG/ACT NA SUSP: 1.0000 | Freq: Every day | NASAL | Status: DC | PRN

## 2021-07-31 MED ORDER — ACETAMINOPHEN 10 MG/ML IV SOLN
INTRAVENOUS | Status: AC
  Filled 2021-07-31: qty 100

## 2021-07-31 MED ORDER — DOCUSATE SODIUM 100 MG PO CAPS
100.0000 mg | ORAL_CAPSULE | Freq: Two times a day (BID) | ORAL | Status: DC
  Administered 2021-07-31 – 2021-08-08 (×15): 100 mg via ORAL
  Filled 2021-07-31 (×15): qty 1

## 2021-07-31 MED ORDER — ARIPIPRAZOLE 5 MG PO TABS
5.0000 mg | ORAL_TABLET | Freq: Every morning | ORAL | Status: DC
  Administered 2021-08-01 – 2021-08-08 (×7): 5 mg via ORAL
  Filled 2021-07-31 (×7): qty 1

## 2021-07-31 MED ORDER — ATORVASTATIN CALCIUM 80 MG PO TABS
80.0000 mg | ORAL_TABLET | Freq: Every day | ORAL | Status: DC
  Administered 2021-07-31 – 2021-08-07 (×8): 80 mg via ORAL
  Filled 2021-07-31 (×8): qty 1

## 2021-07-31 MED ORDER — MORPHINE SULFATE (PF) 2 MG/ML IV SOLN
0.5000 mg | INTRAVENOUS | Status: DC | PRN
  Administered 2021-08-06 – 2021-08-08 (×5): 1 mg via INTRAVENOUS
  Filled 2021-07-31 (×5): qty 1

## 2021-07-31 MED ORDER — DEXAMETHASONE SODIUM PHOSPHATE 10 MG/ML IJ SOLN
INTRAMUSCULAR | Status: DC | PRN
  Administered 2021-07-31: 10 mg

## 2021-07-31 MED ORDER — FENTANYL CITRATE (PF) 100 MCG/2ML IJ SOLN
INTRAMUSCULAR | Status: AC
  Administered 2021-07-31 (×2): 50 ug
  Filled 2021-07-31: qty 2

## 2021-07-31 MED ORDER — FLUDROCORTISONE ACETATE 0.1 MG PO TABS
0.1000 mg | ORAL_TABLET | Freq: Every day | ORAL | Status: DC
  Administered 2021-08-01 – 2021-08-08 (×7): 0.1 mg via ORAL
  Filled 2021-07-31 (×8): qty 1

## 2021-07-31 MED ORDER — NITROGLYCERIN 0.4 MG SL SUBL: 0.4000 mg | SUBLINGUAL_TABLET | SUBLINGUAL | Status: DC | PRN

## 2021-07-31 MED ORDER — PROPOFOL 10 MG/ML IV BOLUS
INTRAVENOUS | Status: AC
  Filled 2021-07-31: qty 20

## 2021-07-31 MED ORDER — ASPIRIN EC 81 MG PO TBEC
81.0000 mg | DELAYED_RELEASE_TABLET | Freq: Every day | ORAL | Status: DC
  Administered 2021-08-01 – 2021-08-08 (×7): 81 mg via ORAL
  Filled 2021-07-31 (×7): qty 1

## 2021-07-31 MED ORDER — ACETAMINOPHEN 325 MG PO TABS
325.0000 mg | ORAL_TABLET | Freq: Four times a day (QID) | ORAL | Status: DC | PRN
  Administered 2021-08-05: 650 mg via ORAL
  Filled 2021-07-31: qty 2

## 2021-07-31 MED ORDER — ROPIVACAINE HCL 5 MG/ML IJ SOLN
INTRAMUSCULAR | Status: DC | PRN
  Administered 2021-07-31 (×6): 5 mL via PERINEURAL

## 2021-07-31 MED ORDER — HYDROCODONE-ACETAMINOPHEN 7.5-325 MG PO TABS
1.0000 | ORAL_TABLET | ORAL | Status: DC | PRN
  Administered 2021-08-02 – 2021-08-06 (×13): 1 via ORAL
  Filled 2021-07-31 (×5): qty 1
  Filled 2021-07-31: qty 2
  Filled 2021-07-31 (×7): qty 1
  Filled 2021-07-31: qty 2

## 2021-07-31 MED ORDER — POTASSIUM CHLORIDE IN NACL 20-0.9 MEQ/L-% IV SOLN
INTRAVENOUS | Status: DC
  Filled 2021-07-31 (×3): qty 1000

## 2021-07-31 MED ORDER — BACLOFEN 5 MG PO TABS: 1.0000 | ORAL_TABLET | Freq: Every day | ORAL | Status: DC

## 2021-07-31 MED ORDER — POVIDONE-IODINE 10 % EX SWAB
2.0000 "application " | Freq: Once | CUTANEOUS | Status: AC
  Administered 2021-07-31: 2 via TOPICAL

## 2021-07-31 MED ORDER — ONDANSETRON HCL 4 MG PO TABS: 4.0000 mg | ORAL_TABLET | Freq: Four times a day (QID) | ORAL | Status: DC | PRN

## 2021-07-31 MED ORDER — HYDROCODONE-ACETAMINOPHEN 5-325 MG PO TABS
1.0000 | ORAL_TABLET | ORAL | Status: DC | PRN
  Administered 2021-07-31 – 2021-08-01 (×6): 1 via ORAL
  Administered 2021-08-04 – 2021-08-05 (×4): 2 via ORAL
  Administered 2021-08-05: 1 via ORAL
  Filled 2021-07-31: qty 1
  Filled 2021-07-31 (×2): qty 2
  Filled 2021-07-31 (×4): qty 1
  Filled 2021-07-31 (×3): qty 2

## 2021-07-31 MED ORDER — CHLORHEXIDINE GLUCONATE 4 % EX LIQD: 60.0000 mL | Freq: Once | CUTANEOUS | Status: DC

## 2021-07-31 MED ORDER — POLYETHYLENE GLYCOL 3350 17 G PO PACK: 17.0000 g | PACK | Freq: Every day | ORAL | Status: DC | PRN

## 2021-07-31 MED ORDER — PROMETHAZINE HCL 25 MG/ML IJ SOLN: 6.2500 mg | INTRAMUSCULAR | Status: DC | PRN

## 2021-07-31 MED ORDER — ACETAMINOPHEN 10 MG/ML IV SOLN
1000.0000 mg | Freq: Once | INTRAVENOUS | Status: DC | PRN
  Administered 2021-07-31: 1000 mg via INTRAVENOUS

## 2021-07-31 MED ORDER — ORAL CARE MOUTH RINSE: 15.0000 mL | Freq: Once | OROMUCOSAL | Status: AC

## 2021-07-31 MED ORDER — MIDAZOLAM HCL 2 MG/2ML IJ SOLN
2.0000 mg | Freq: Once | INTRAMUSCULAR | Status: AC
  Administered 2021-07-31: 1 mg via INTRAVENOUS
  Filled 2021-07-31: qty 2

## 2021-07-31 MED ORDER — HYDRALAZINE HCL 10 MG PO TABS: 10.0000 mg | ORAL_TABLET | Freq: Four times a day (QID) | ORAL | Status: DC | PRN

## 2021-07-31 MED ORDER — FAMOTIDINE 20 MG PO TABS
10.0000 mg | ORAL_TABLET | Freq: Every day | ORAL | Status: DC
  Administered 2021-08-01 – 2021-08-08 (×7): 10 mg via ORAL
  Filled 2021-07-31 (×8): qty 1

## 2021-07-31 MED ORDER — GABAPENTIN 300 MG PO CAPS
900.0000 mg | ORAL_CAPSULE | Freq: Two times a day (BID) | ORAL | Status: DC
  Administered 2021-07-31 – 2021-08-08 (×15): 900 mg via ORAL
  Filled 2021-07-31 (×15): qty 3

## 2021-07-31 MED ORDER — FENTANYL CITRATE (PF) 250 MCG/5ML IJ SOLN
INTRAMUSCULAR | Status: AC
  Filled 2021-07-31: qty 5

## 2021-07-31 MED ORDER — ONDANSETRON HCL 4 MG/2ML IJ SOLN
INTRAMUSCULAR | Status: DC | PRN
  Administered 2021-07-31: 4 mg via INTRAVENOUS

## 2021-07-31 MED ORDER — ENOXAPARIN SODIUM 40 MG/0.4ML IJ SOSY
40.0000 mg | PREFILLED_SYRINGE | INTRAMUSCULAR | Status: DC
  Administered 2021-08-01 – 2021-08-08 (×7): 40 mg via SUBCUTANEOUS
  Filled 2021-07-31 (×7): qty 0.4

## 2021-07-31 SURGICAL SUPPLY — 53 items
BAG COUNTER SPONGE SURGICOUNT (BAG) ×2 IMPLANT
BAR GLASS FIBER EXFX 11X150 (EXFIX) ×4 IMPLANT
BAR GLASS FIBER EXFX 11X350 (EXFIX) ×4 IMPLANT
BIT DRILL 3.2 XTRAFIX BLUE (BIT) ×2 IMPLANT
BIT DRILL CANN MED FLUTE 4.0 (BIT) ×1 IMPLANT
BNDG COHESIVE 4X5 TAN STRL (GAUZE/BANDAGES/DRESSINGS) ×2 IMPLANT
BNDG ELASTIC 4X5.8 VLCR STR LF (GAUZE/BANDAGES/DRESSINGS) ×2 IMPLANT
BNDG ELASTIC 6X5.8 VLCR STR LF (GAUZE/BANDAGES/DRESSINGS) IMPLANT
BNDG GAUZE ELAST 4 BULKY (GAUZE/BANDAGES/DRESSINGS) ×2 IMPLANT
BRUSH SCRUB EZ PLAIN DRY (MISCELLANEOUS) ×4 IMPLANT
CAP PROTECTIVE TRANSFX 4.5X5MM (EXFIX) ×2 IMPLANT
CHLORAPREP W/TINT 26 (MISCELLANEOUS) ×2 IMPLANT
CLAMP BLUE BAR TO BAR (EXFIX) ×4 IMPLANT
CLAMP BLUE BAR TO PIN (EXFIX) ×8 IMPLANT
COVER SURGICAL LIGHT HANDLE (MISCELLANEOUS) ×4 IMPLANT
DRAPE C-ARM 42X72 X-RAY (DRAPES) IMPLANT
DRAPE C-ARMOR (DRAPES) ×2 IMPLANT
DRAPE IMP U-DRAPE 54X76 (DRAPES) ×4 IMPLANT
DRAPE ORTHO SPLIT 77X108 STRL (DRAPES) ×2
DRAPE SURG ORHT 6 SPLT 77X108 (DRAPES) ×2 IMPLANT
DRAPE U-SHAPE 47X51 STRL (DRAPES) ×2 IMPLANT
DRILL CANN 4.0MM (BIT) ×2
DRSG MEPITEL 4X7.2 (GAUZE/BANDAGES/DRESSINGS) ×2 IMPLANT
ELECT REM PT RETURN 9FT ADLT (ELECTROSURGICAL) ×2
ELECTRODE REM PT RTRN 9FT ADLT (ELECTROSURGICAL) ×1 IMPLANT
GAUZE SPONGE 4X4 12PLY STRL (GAUZE/BANDAGES/DRESSINGS) ×2 IMPLANT
GLOVE SURG ENC MOIS LTX SZ6.5 (GLOVE) ×6 IMPLANT
GLOVE SURG ENC MOIS LTX SZ7.5 (GLOVE) ×8 IMPLANT
GLOVE SURG UNDER POLY LF SZ6.5 (GLOVE) ×2 IMPLANT
GLOVE SURG UNDER POLY LF SZ7.5 (GLOVE) ×2 IMPLANT
GOWN STRL REUS W/ TWL LRG LVL3 (GOWN DISPOSABLE) ×2 IMPLANT
GOWN STRL REUS W/TWL LRG LVL3 (GOWN DISPOSABLE) ×2
KIT BASIN OR (CUSTOM PROCEDURE TRAY) ×2 IMPLANT
KIT TURNOVER KIT B (KITS) ×2 IMPLANT
MANIFOLD NEPTUNE II (INSTRUMENTS) ×2 IMPLANT
NEEDLE 22X1 1/2 (OR ONLY) (NEEDLE) IMPLANT
NS IRRIG 1000ML POUR BTL (IV SOLUTION) ×2 IMPLANT
PACK ORTHO EXTREMITY (CUSTOM PROCEDURE TRAY) ×2 IMPLANT
PAD ARMBOARD 7.5X6 YLW CONV (MISCELLANEOUS) ×4 IMPLANT
PAD CAST 4YDX4 CTTN HI CHSV (CAST SUPPLIES) ×1 IMPLANT
PADDING CAST COTTON 4X4 STRL (CAST SUPPLIES) ×1
PADDING CAST COTTON 6X4 STRL (CAST SUPPLIES) IMPLANT
PIN 4X100X20MM EXFIX LG BLUNT (EXFIX) ×4 IMPLANT
PIN CLAMP 2BAR 75MM BLUE (EXFIX) ×2 IMPLANT
PIN HALF 5X160X35 BLUNT TIP (EXFIX) ×4 IMPLANT
PIN TRANSFIXING 5.0 (EXFIX) ×2 IMPLANT
SPONGE T-LAP 18X18 ~~LOC~~+RFID (SPONGE) ×2 IMPLANT
STAPLER VISISTAT 35W (STAPLE) IMPLANT
STRIP CLOSURE SKIN 1/2X4 (GAUZE/BANDAGES/DRESSINGS) IMPLANT
SUT PROLENE 0 CT (SUTURE) IMPLANT
TOWEL GREEN STERILE (TOWEL DISPOSABLE) ×4 IMPLANT
TOWEL GREEN STERILE FF (TOWEL DISPOSABLE) ×4 IMPLANT
UNDERPAD 30X36 HEAVY ABSORB (UNDERPADS AND DIAPERS) ×2 IMPLANT

## 2021-07-31 NOTE — Plan of Care (Signed)

## 2021-07-31 NOTE — Op Note (Signed)
Orthopaedic Surgery Operative Note (CSN: [id number] ) Date of Surgery: 07/31/2021  Admit Date: 07/31/2021   Diagnoses: Pre-Op Diagnoses: Right trimalleolar ankle fracture dislocation  Post-Op Diagnosis: Same  Procedures: CPT 20692-External fixation of right ankle fracture CPT 27818-Closed reduction of right trimalleolar ankle fracture  Surgeons : Primary: Roby Lofts, MD  Assistant: Ulyses Southward, PA-C  Location: OR 3   Anesthesia:General with regional   Antibiotics: Ancef 2g preop   Tourniquet time: None    Estimated Blood Loss:5 mL  Complications:None  Specimens:None  Implants: [employer] Large xtrafix external fixation  Indications for Surgery: 60 year old female who sustained a fall and a right ankle fracture dislocation.  She was splinted in the emergency room and subsequently was seen in the office yesterday.  At that point her swelling was significantly with subluxation of her ankle joint.  She also had fracture blisters.  Due to the unstable nature of her injury and the soft tissue compromise it was recommended to proceed with a closed reduction and external fixation.  Risks and benefits were discussed with the patient and her daughter.  Risks include but not limited to bleeding, infection, malunion, nonunion, hardware failure, hardware irritation, nerve or blood vessel injury, need for further surgery, DVT, even the possibility anesthetic complications.  They agreed to proceed with surgery and consent was obtained.  Operative Findings: Closed reduction of right trimalleolar ankle fracture with a spanning ankle external fixation using [employer] large Xtra fix  Procedure: The patient was identified in the preoperative holding area. Consent was confirmed with the patient and their family and all questions were answered. The operative extremity was marked after confirmation with the patient. she was then brought back to the operating room by our anesthesia  colleagues.  She was placed under general anesthetic and carefully transferred over to a radiolucent flat top table.  The right lower extremity was then prepped and draped in usual sterile fashion.  Timeout was performed verifying patient, procedure, and the extremity.  Preoperative antibiotics were dosed.  Fluoroscopic imaging was obtained to show the unstable nature of her injury.  I marked out an incision over her tibia to drill and place 5.0 mm threaded half pins.  The pin clamp was used as a guide and another 5.0 mm half pin was placed.  I then placed a 6.0 mm calcaneal transfixion pin.  4.0 mm threaded half pins were placed in the first and fifth metatarsal.  Pin clamps were attached to the pins and 11 mm bars were attached to the pin clamps.  The bars were connected to the tibial pins.  A reduction was performed and the Ex-Fix was final tightened.  Final fluoroscopic imaging was obtained.  The lower extremity was wrapped for compression.  Patient was then awoken from anesthesia and taken to the PACU in stable condition.  Post Op Plan/Instructions: Patient be nonweightbearing to right lower extremity.  She will receive postoperative Ancef.  She will be placed on Lovenox for DVT prophylaxis.  Due to her limited mobility she will be admitted for physical therapy and likely skilled nursing facility placement.  Definitive surgery will likely be next week depending on soft tissue swelling.  I was present and performed the entire surgery.  Ulyses Southward, PA-C did assist me throughout the case. An assistant was necessary given the difficulty in approach, maintenance of reduction and ability to instrument the fracture.   Truitt Merle, MD [facility]

## 2021-07-31 NOTE — Interval H&P Note (Signed)
History and Physical Interval Note:  07/31/2021 7:43 AM  Angela Mahoney  has presented today for surgery, with the diagnosis of ankle fracture.  The various methods of treatment have been discussed with the patient and family. After consideration of risks, benefits and other options for treatment, the patient has consented to  Procedure(s): External Fixation Ankle (Right) as a surgical intervention.  The patient's history has been reviewed, patient examined, no change in status, stable for surgery.  I have reviewed the patient's chart and labs.  Questions were answered to the patient's satisfaction.     Caryn Bee P [place]

## 2021-07-31 NOTE — Anesthesia Procedure Notes (Signed)
Procedure Name: LMA Insertion Date/Time: 07/31/2021 9:53 AM Performed by: Samara Deist, CRNA Pre-anesthesia Checklist: Patient identified, Emergency Drugs available, Suction available and Patient being monitored Patient Re-evaluated:Patient Re-evaluated prior to induction Oxygen Delivery Method: Circle System Utilized Preoxygenation: Pre-oxygenation with 100% oxygen Induction Type: IV induction Ventilation: Mask ventilation without difficulty LMA: LMA inserted LMA Size: 4.0 Number of attempts: 1 Placement Confirmation: positive ETCO2 Tube secured with: Tape Dental Injury: Teeth and Oropharynx as per pre-operative assessment

## 2021-07-31 NOTE — Anesthesia Postprocedure Evaluation (Signed)
Anesthesia Post Note  Patient: Angela Mahoney  Procedure(s) Performed: External Fixation Ankle (Right)     Patient location during evaluation: PACU Anesthesia Type: Regional and General Level of consciousness: awake Pain management: pain level controlled Vital Signs Assessment: post-procedure vital signs reviewed and stable Respiratory status: spontaneous breathing Cardiovascular status: stable Postop Assessment: no apparent nausea or vomiting Anesthetic complications: no   No notable events documented.  Last Vitals:  Vitals:   07/31/21 1126 07/31/21 1141  BP: 100/70 111/68  Pulse:  83  Resp: 14 12  Temp:  (!) 36.2 C  SpO2: 93% 98%    Last Pain:  Vitals:   07/31/21 1141  TempSrc:   PainSc: Asleep                 Caren Macadam

## 2021-07-31 NOTE — Anesthesia Procedure Notes (Addendum)
Anesthesia Regional Block: Popliteal block   Pre-Anesthetic Checklist: , timeout performed,  Correct Patient, Correct Site, Correct Laterality,  Correct Procedure, Correct Position, site marked,  Risks and benefits discussed,  Surgical consent,  Pre-op evaluation,  At surgeon's request and post-op pain management  Laterality: Lower and Right  Prep: chloraprep       Needles:  Injection technique: Single-shot  Needle Type: Echogenic Stimulator Needle     Needle Length: 9cm  Needle Gauge: 20     Additional Needles:   Procedures:,,,, ultrasound used (permanent image in chart),,    Narrative:  Start time: 07/31/2021 9:30 AM End time: 07/31/2021 9:37 AM Injection made incrementally with aspirations every 5 mL.  Performed by: Personally  Anesthesiologist: Leilani Able, MD

## 2021-07-31 NOTE — Progress Notes (Signed)
Pt is stable and has been on hold for phase 1 in recovery for more than a few hours. Pt has been relocated to bay 16 until bed placement. Will continue to monitor.

## 2021-07-31 NOTE — Transfer of Care (Signed)
Immediate Anesthesia Transfer of Care Note  Patient: Angela Mahoney  Procedure(s) Performed: External Fixation Ankle (Right)  Patient Location: PACU  Anesthesia Type:General and Regional  Level of Consciousness: drowsy  Airway & Oxygen Therapy: Patient Spontanous Breathing and Patient connected to face mask oxygen  Post-op Assessment: Report given to RN and Post -op Vital signs reviewed and stable  Post vital signs: Reviewed and stable  Last Vitals:  Vitals Value Taken Time  BP 113/75 07/31/21 1041  Temp    Pulse    Resp 14 07/31/21 1043  SpO2    Vitals shown include unvalidated device data.  Last Pain:  Vitals:   07/31/21 0718  TempSrc:   PainSc: 10-Worst pain ever         Complications: No notable events documented.

## 2021-07-31 NOTE — Addendum Note (Signed)
Addendum  created 07/31/21 1538 by Leilani Able, MD   Child order released for a procedure order, Clinical Note Signed, Intraprocedure Blocks edited, Intraprocedure Meds edited, Order Canceled from Note, SmartForm saved

## 2021-07-31 NOTE — Anesthesia Procedure Notes (Addendum)
Anesthesia Regional Block: Adductor canal block   Pre-Anesthetic Checklist: , timeout performed,  Correct Patient, Correct Site, Correct Laterality,  Correct Procedure, Correct Position, site marked,  Risks and benefits discussed,  Surgical consent,  Pre-op evaluation,  At surgeon's request and post-op pain management  Laterality: Lower and Right  Prep: chloraprep       Needles:  Injection technique: Single-shot  Needle Type: Echogenic Stimulator Needle     Needle Length: 9cm  Needle Gauge: 20   Needle insertion depth: 3 cm   Additional Needles:   Procedures:,,,, ultrasound used (permanent image in chart),,    Narrative:  Start time: 07/31/2021 9:15 AM End time: 07/31/2021 9:25 AM Injection made incrementally with aspirations every 5 mL.  Performed by: Personally  Anesthesiologist: Leilani Able, MD

## 2021-08-01 LAB — CBC
HCT: 35.6 % — ABNORMAL LOW (ref 36.0–46.0)
Hemoglobin: 11.7 g/dL — ABNORMAL LOW (ref 12.0–15.0)
MCH: 30 pg (ref 26.0–34.0)
MCHC: 32.9 g/dL (ref 30.0–36.0)
MCV: 91.3 fL (ref 80.0–100.0)
Platelets: 237 10*3/uL (ref 150–400)
RBC: 3.9 MIL/uL (ref 3.87–5.11)
RDW: 13.6 % (ref 11.5–15.5)
WBC: 6 10*3/uL (ref 4.0–10.5)
nRBC: 0 % (ref 0.0–0.2)

## 2021-08-01 LAB — BASIC METABOLIC PANEL
Anion gap: 7 (ref 5–15)
BUN: 14 mg/dL (ref 8–23)
CO2: 26 mmol/L (ref 22–32)
Calcium: 9.1 mg/dL (ref 8.9–10.3)
Chloride: 107 mmol/L (ref 98–111)
Creatinine, Ser: 0.82 mg/dL (ref 0.44–1.00)
GFR, Estimated: >60 mL/min (ref >60)
Glucose, Bld: 136 mg/dL — ABNORMAL HIGH (ref 70–99)
Potassium: 3.9 mmol/L (ref 3.5–5.1)
Sodium: 140 mmol/L (ref 135–145)

## 2021-08-01 NOTE — Evaluation (Signed)
Physical Therapy Evaluation Patient Details Name: Angela Mahoney MRN: [id number] DOB: [birth year] Today's Date: 08/01/2021  History of Present Illness  Pt is a 60 y/o female admitted 9/15 due to R trimalleolar ankle fracture dislocation, syndesmotic disruption s/p exfix application 9/15. Planned ORIF in 1-2 weeks.  Hx of ground level fall 9/9 with R ankle fx. PMH includes: COPD, CAD, fibromyalgia.   Clinical Impression  Pt in bed upon arrival of PT, agreeable to evaluation at this time. Prior to admission the pt was mobilizing with use of rollator and WC since initial injury, is typically main caregiver for husband with Parkinson's. The pt now presents with limitations in functional mobility, strength, power, dynamic stability, and activity tolerance due to above dx and resulting pain, and will continue to benefit from skilled PT to address these deficits. The pt was able to complete sit-stand transfers with modA and good ability to maintain NWB RLE. She does require increased use of momentum in addition to assist to complete power up to standing due to deficits in strength and stability as listed above, will continue to benefit from skilled PT acutely and following d/c to facilitate return to greater independence and safety with mobility. As pt with no able-bodied caregiver available, recommend short stint SNF rehab to allow for improved ability to transfer without assist prior to return home.         Recommendations for follow up therapy are one component of a multi-disciplinary discharge planning process, led by the attending physician.  Recommendations may be updated based on patient status, additional functional criteria and insurance authorization.  Follow Up Recommendations SNF;Supervision for mobility/OOB    Equipment Recommendations   (defer to post acute)    Recommendations for Other Services       Precautions / Restrictions Precautions Precautions: Fall;Other  (comment) Precaution Comments: ex fix R ankle; soft BPs Restrictions Weight Bearing Restrictions: Yes RLE Weight Bearing: Non weight bearing      Mobility  Bed Mobility Overal bed mobility: Needs Assistance Bed Mobility: Supine to Sit     Supine to sit: Min guard     General bed mobility comments: for safety, increased time but able to manage R LE without assist    Transfers Overall transfer level: Needs assistance Equipment used: Rolling walker (2 wheeled) Transfers: Sit to/from [employer] Sit to Stand: Mod assist Stand pivot transfers: Min assist       General transfer comment: modA with use of momentum to power up to standing, minA for pt to complete small pivoting hops to recliner  Ambulation/Gait             General Gait Details: pt only able to take small pivoting hops at this time         Balance Overall balance assessment: Needs assistance Sitting-balance support: No upper extremity supported;Feet supported Sitting balance-Leahy Scale: Good     Standing balance support: Bilateral upper extremity supported Standing balance-Leahy Scale: Poor Standing balance comment: BUE support on RW                             Pertinent Vitals/Pain Pain Assessment: 0-10 Faces Pain Scale: Hurts even more Pain Location: R ankle Pain Descriptors / Indicators: Discomfort;Grimacing;Operative site guarding Pain Intervention(s): Limited activity within patient's tolerance;Monitored during session;Repositioned;Premedicated before session    Home Living Family/patient expects to be discharged to:: Private residence Living Arrangements: Spouse/significant other Available Help at Discharge: Family;Available 24 hours/day Type  of Home: Apartment (handicapped) Home Access: Other (comment) (1 curb step)     Home Layout: One level Home Equipment: Grab bars - tub/shower;Walker - 4 wheels;Wheelchair - manual Additional Comments: spouse is 64  with Parkinsons    Prior Function Level of Independence: Independent with assistive device(s)         Comments: used rollator for mobility, limited since ankle injury (reports getting w/c on 9/13 but prior to that her neighbor was lifting her up and pushing her seated in rollator to the bathroom)     Hand Dominance   Dominant Hand: Right    Extremity/Trunk Assessment   Upper Extremity Assessment Upper Extremity Assessment: Defer to OT evaluation    Lower Extremity Assessment Lower Extremity Assessment: RLE deficits/detail;LLE deficits/detail RLE Deficits / Details: limited by ex fix, pt able to move toes and resports intact sensation. pt able to complete full AROM knee extension and flexion RLE: Unable to fully assess due to immobilization RLE Sensation: WNL LLE Deficits / Details: pt reports limited by fibromyalgia at basline    Cervical / Trunk Assessment Cervical / Trunk Assessment: Normal  Communication   Communication: No difficulties  Cognition Arousal/Alertness: Awake/alert Behavior During Therapy: WFL for tasks assessed/performed Overall Cognitive Status: Within Functional Limits for tasks assessed                                 General Comments: pt able to follow all instructions during session      General Comments General comments (skin integrity, edema, etc.): VSS on RA    Exercises     Assessment/Plan    PT Assessment Patient needs continued PT services  PT Problem List Decreased strength;Decreased range of motion;Decreased activity tolerance;Decreased balance;Decreased mobility;Decreased coordination;Pain       PT Treatment Interventions DME instruction;Gait training;Stair training;Functional mobility training;Therapeutic activities;Therapeutic exercise;Balance training;Patient/family education    PT Goals (Current goals can be found in the Care Plan section)  Acute Rehab PT Goals Patient Stated Goal: to get better, to be able to  fully move into new apt PT Goal Formulation: With patient Time For Goal Achievement: 08/15/21 Potential to Achieve Goals: Good    Frequency Min 3X/week    AM-PAC PT "6 Clicks" Mobility  Outcome Measure Help needed turning from your back to your side while in a flat bed without using bedrails?: A Little Help needed moving from lying on your back to sitting on the side of a flat bed without using bedrails?: A Little Help needed moving to and from a bed to a chair (including a wheelchair)?: A Lot Help needed standing up from a chair using your arms (e.g., wheelchair or bedside chair)?: A Lot Help needed to walk in hospital room?: A Lot Help needed climbing 3-5 steps with a railing? : A Lot 6 Click Score: 14    End of Session Equipment Utilized During Treatment: Gait belt Activity Tolerance: Patient tolerated treatment well Patient left: in chair;with call bell/phone within reach;with nursing/sitter in room Nurse Communication: Mobility status PT Visit Diagnosis: Unsteadiness on feet (R26.81);Repeated falls (R29.6);Muscle weakness (generalized) (M62.81);Pain Pain - Right/Left: Right Pain - part of body: Leg    Time: 4098-1191 PT Time Calculation (min) (ACUTE ONLY): 19 min   Charges:   PT Evaluation $PT Eval Low Complexity: 1 Low          Vickki Muff, PT, DPT   Acute Rehabilitation Department Pager #: [phone]  Ronnie Derby 08/01/2021, 5:43 PM

## 2021-08-01 NOTE — Plan of Care (Signed)
  Problem: Health Behavior/Discharge Planning: Goal: Ability to manage health-related needs will improve Outcome: Progressing   Problem: Clinical Measurements: Goal: Will remain free from infection Outcome: Progressing Goal: Cardiovascular complication will be avoided Outcome: Progressing   Problem: Activity: Goal: Risk for activity intolerance will decrease Outcome: Progressing   Problem: Elimination: Goal: Will not experience complications related to bowel motility Outcome: Progressing

## 2021-08-01 NOTE — Evaluation (Signed)
Occupational Therapy Evaluation Patient Details Name: Angela Mahoney MRN: [id number] DOB: [birth year] Today's Date: 08/01/2021   History of Present Illness Pt is a 60 y/o female admitted 9/15 due to R trimalleolar ankle fracture dislocation, syndesmotic disruption s/p exfix application 9/15. Planned ORIF in 1-2 weeks.  Hx of ground level fall 9/9 with R ankle fx. PMH includes: COPD, CAD, fibromyalgia.   Clinical Impression   Prior to fall on 9/9 pt independent with ADLs, using rollator for mobility and driving. Admitted for above and limited by problem list below, including R LE pain and NWB status, decreased activity tolerance, generalized weakness, impaired balance.  She completes lateral scoot transfers with min assist, requires up to max assist for ADLs. Declines attempts to stand today. She reports her spouse is available 24/7 but he has Parkinson's and is unable to assist her. Based on performance today, believe she will benefit from further OT services acutely and after dc at SNF level to optimize independence with ADLs and mobility. Will follow.      Recommendations for follow up therapy are one component of a multi-disciplinary discharge planning process, led by the attending physician.  Recommendations may be updated based on patient status, additional functional criteria and insurance authorization.   Follow Up Recommendations  SNF;Supervision/Assistance - 24 hour    Equipment Recommendations  3 in 1 bedside commode (drop arm commde)    Recommendations for Other Services       Precautions / Restrictions Precautions Precautions: Fall;Other (comment) Precaution Comments: ex fix R ankle; soft BPs Restrictions Weight Bearing Restrictions: Yes RLE Weight Bearing: Non weight bearing      Mobility Bed Mobility Overal bed mobility: Needs Assistance Bed Mobility: Supine to Sit     Supine to sit: Min guard     General bed mobility comments: for safety, increased time  but able to manage R LE without assist    Transfers Overall transfer level: Needs assistance   Transfers: Lateral/Scoot Transfers          Lateral/Scoot Transfers: Min assist General transfer comment: min assist with cueing for safety, technique towards L side    Balance Overall balance assessment: Needs assistance Sitting-balance support: No upper extremity supported;Feet supported Sitting balance-Leahy Scale: Good                                     ADL either performed or assessed with clinical judgement   ADL Overall ADL's : Needs assistance/impaired     Grooming: Set up;Sitting   Upper Body Bathing: Set up;Sitting   Lower Body Bathing: Moderate assistance;Sitting/lateral leans   Upper Body Dressing : Set up;Sitting   Lower Body Dressing: Maximal assistance;Sitting/lateral leans   Toilet Transfer: Minimal assistance Toilet Transfer Details (indicate cue type and reason): simulated lateral scoot to recliner         Functional mobility during ADLs: Minimal assistance General ADL Comments: limited by pain in R LE, genearlized weakness     Vision         Perception     Praxis      Pertinent Vitals/Pain Pain Assessment: Faces Faces Pain Scale: Hurts even more Pain Location: R ankle Pain Descriptors / Indicators: Discomfort;Grimacing;Operative site guarding Pain Intervention(s): Monitored during session;Limited activity within patient's tolerance;Repositioned     Hand Dominance Right   Extremity/Trunk Assessment Upper Extremity Assessment Upper Extremity Assessment: Overall WFL for tasks assessed   Lower Extremity Assessment  Lower Extremity Assessment: Defer to PT evaluation       Communication Communication Communication: No difficulties   Cognition Arousal/Alertness: Awake/alert Behavior During Therapy: WFL for tasks assessed/performed Overall Cognitive Status: Within Functional Limits for tasks assessed                                      General Comments       Exercises     Shoulder Instructions      Home Living Family/patient expects to be discharged to:: Private residence Living Arrangements: Spouse/significant other Available Help at Discharge: Family;Available 24 hours/day Type of Home: Apartment (just moved into handicapped accessible apartment) Home Access: Other (comment) (1 curb "step")     Home Layout: One level     Bathroom Shower/Tub: Chief Strategy Officer: Standard     Home Equipment: Grab bars - tub/shower;Walker - 4 wheels;Wheelchair - manual   Additional Comments: spouse is 63 with Parkinsons      Prior Functioning/Environment Level of Independence: Independent with assistive device(s)        Comments: used rollator for mobility, limited since ankle injury (reports getting w/c on 9/13 but prior to that her neighbor was lifting her up and pushing her seated in rollator to the bathroom)        OT Problem List: Decreased strength;Decreased activity tolerance;Impaired balance (sitting and/or standing);Decreased knowledge of use of DME or AE;Decreased knowledge of precautions;Pain;Obesity      OT Treatment/Interventions: Self-care/ADL training;DME and/or AE instruction;Therapeutic activities;Patient/family education;Balance training;Therapeutic exercise    OT Goals(Current goals can be found in the care plan section) Acute Rehab OT Goals Patient Stated Goal: to get better OT Goal Formulation: With patient Time For Goal Achievement: 08/15/21 Potential to Achieve Goals: Good  OT Frequency: Min 2X/week   Barriers to D/C:            Co-evaluation              AM-PAC OT "6 Clicks" Daily Activity     Outcome Measure Help from another person eating meals?: None Help from another person taking care of personal grooming?: A Little Help from another person toileting, which includes using toliet, bedpan, or urinal?: Total Help from  another person bathing (including washing, rinsing, drying)?: A Lot Help from another person to put on and taking off regular upper body clothing?: A Little Help from another person to put on and taking off regular lower body clothing?: A Lot 6 Click Score: 15   End of Session Equipment Utilized During Treatment: Gait belt Nurse Communication: Mobility status;Weight bearing status;Precautions  Activity Tolerance: Patient tolerated treatment well Patient left: in chair;with call bell/phone within reach;with chair alarm set  OT Visit Diagnosis: Other abnormalities of gait and mobility (R26.89);Muscle weakness (generalized) (M62.81);History of falling (Z91.81);Pain Pain - Right/Left: Right Pain - part of body: Ankle and joints of foot                Time: 7017-7939 OT Time Calculation (min): 30 min Charges:  OT General Charges $OT Visit: 1 Visit OT Evaluation $OT Eval Moderate Complexity: 1 Mod OT Treatments $Self Care/Home Management : 8-22 mins  Barry Brunner, OT Acute Rehabilitation Services Pager [phone] Office [phone]   Chancy Milroy 08/01/2021, 9:38 AM

## 2021-08-01 NOTE — Progress Notes (Signed)
Orthopaedic Trauma Progress Note  SUBJECTIVE: Reports mild pain about operative site. No chest pain. No SOB. No nausea/vomiting. No other complaints. About to get up with therapies  OBJECTIVE:  Vitals:   07/31/21 2217 08/01/21 0825  BP: 98/70 94/72  Pulse: 87 92  Resp: 16 17  Temp: 97.8 F (36.6 C) 98.6 F (37 C)  SpO2: 98% 100%    General: Sitting up in bed, NAD Respiratory: No increased work of breathing.  RLE: Ex fix in place, foot swollen. Pin sites dressings with minimal serosanguinous drainage. +DP pulse  IMAGING: Stable post op imaging.   LABS:  Results for orders placed or performed during the hospital encounter of 07/31/21 (from the past 24 hour(s))  CBC     Status: None   Collection Time: 07/31/21  7:15 PM  Result Value Ref Range   WBC 4.1 4.0 - 10.5 K/uL   RBC 4.10 3.87 - 5.11 MIL/uL   Hemoglobin 12.3 12.0 - 15.0 g/dL   HCT 08.6 57.8 - 46.9 %   MCV 92.0 80.0 - 100.0 fL   MCH 30.0 26.0 - 34.0 pg   MCHC 32.6 30.0 - 36.0 g/dL   RDW 62.9 52.8 - 41.3 %   Platelets 242 150 - 400 K/uL   nRBC 0.0 0.0 - 0.2 %  Creatinine, serum     Status: None   Collection Time: 07/31/21  7:15 PM  Result Value Ref Range   Creatinine, Ser 0.81 0.44 - 1.00 mg/dL   GFR, Estimated >24 >40 mL/min  VITAMIN D 25 Hydroxy (Vit-D Deficiency, Fractures)     Status: None   Collection Time: 07/31/21  7:15 PM  Result Value Ref Range   Vit D, 25-Hydroxy 46.62 30 - 100 ng/mL  Basic metabolic panel     Status: Abnormal   Collection Time: 08/01/21  1:06 AM  Result Value Ref Range   Sodium 140 135 - 145 mmol/L   Potassium 3.9 3.5 - 5.1 mmol/L   Chloride 107 98 - 111 mmol/L   CO2 26 22 - 32 mmol/L   Glucose, Bld 136 (H) 70 - 99 mg/dL   BUN 14 8 - 23 mg/dL   Creatinine, Ser 1.02 0.44 - 1.00 mg/dL   Calcium 9.1 8.9 - 72.5 mg/dL   GFR, Estimated >36 >64 mL/min   Anion gap 7 5 - 15  CBC     Status: Abnormal   Collection Time: 08/01/21  1:06 AM  Result Value Ref Range   WBC 6.0 4.0 - 10.5 K/uL    RBC 3.90 3.87 - 5.11 MIL/uL   Hemoglobin 11.7 (L) 12.0 - 15.0 g/dL   HCT 40.3 (L) 47.4 - 25.9 %   MCV 91.3 80.0 - 100.0 fL   MCH 30.0 26.0 - 34.0 pg   MCHC 32.9 30.0 - 36.0 g/dL   RDW 56.3 87.5 - 64.3 %   Platelets 237 150 - 400 K/uL   nRBC 0.0 0.0 - 0.2 %    ASSESSMENT: Angela Mahoney is a 60 y.o. female, 1 Day Post-OpXTERNAL FIXATION RIGHT ANKLE  CV/Blood loss: Acute blood loss anemia, Hgb 11.7 this AM. Hemodynamically stable  PLAN: Weightbearing: NWB RLE Incisional and dressing care: Pin site dressings PRN Showering: ok to shower with ex-fix in place, maintain ace wrap over ankle Orthopedic device(s):  Ex Fix RLE   Pain management: continue current regimen VTE prophylaxis: Lovenox, SCDs ID:  Ancef 2gm post op Foley/Lines:  No foley, KVO IVFs Impediments to Fracture Healing: Vit D  level 46, no supplementation needed Dispo: PT/OT eval today. Due to her limited mobility, she will likely require skilled nursing facility placement.  Definitive surgery will likely be next week depending on soft tissue swelling. Follow - up plan:  TBD  Contact information:  Truitt Merle MD, Ulyses Southward PA-C. After hours and holidays please check [email or web address] for group call information for [facility]   Leonor Darnell A. Michaelyn Barter, PA-C [phone] (office) [email or web address]

## 2021-08-01 NOTE — TOC Initial Note (Addendum)
Transition of Care [facility]) - Initial/Assessment Note    Patient Details  Name: Angela Mahoney MRN: [id number] Date of Birth: 1961-02-23  Transition [facility] Care [facility]) CM/SW Contact:    Epifanio Lesches, RN Phone Number: 08/01/2021, 2:35 PM  Clinical Narrative:                    -s/p exfix application 9/15 2/2 R trimalleolar ankle fracture dislocation  Pt states from home with husband, husband with Parkinson diease. PTA independent with ADL's. Owns W/C and rolling walker.  PT evaluation pending...OT evaluation completed  and recommends SNF;Supervision/Assistance - 24 hour .  NCM spoke with pt regarding D/C planning and the potential need for SNF placement ST. Pt is agreeable to SNF placement. Patient reports spouse is currently unable to care for her  at their home given patient's current physical needs and fall risk.  Patient without preference for SNF. Patient expressed being hopeful for rehab and to feel better soon. No further questions reported at this time.  TOC team  to continue to follow and assist with discharge planning needs.     9/19 Tentative plan: OR for fixation 9/21 ( Wednesday).  Expected Discharge Plan: Skilled Nursing Facility Barriers to Discharge: Continued Medical Work up   Patient Goals and CMS Choice   CMS Medicare.gov Compare Post Acute Care list provided to:: Patient Choice offered to / list presented to : Patient  Expected Discharge Plan and Services Expected Discharge Plan: Skilled Nursing Facility   Discharge Planning Services: CM Consult                                          Prior Living Arrangements/Services   Lives with:: Spouse Patient language and need for interpreter reviewed:: Yes Do you feel safe going back to the place where you live?: Yes      Need for Family Participation in Patient Care: Yes (Comment) Care giver support system in place?: Yes (comment) Current home services: DME (W/C, rolling walker) Criminal  Activity/Legal Involvement Pertinent to Current Situation/Hospitalization: No - Comment as needed  Activities of Daily Living Home Assistive Devices/Equipment: Dentures (specify type), Walker (specify type), Blood pressure cuff ADL Screening (condition at time of admission) Patient's cognitive ability adequate to safely complete daily activities?: Yes Is the patient deaf or have difficulty hearing?: No Does the patient have difficulty seeing, even when wearing glasses/contacts?: No Does the patient have difficulty concentrating, remembering, or making decisions?: No Patient able to express need for assistance with ADLs?: Yes Does the patient have difficulty dressing or bathing?: Yes Independently performs ADLs?: No Communication: Needs assistance Is this a change from baseline?: Pre-admission baseline Dressing (OT): Needs assistance Is this a change from baseline?: Pre-admission baseline Grooming: Independent Feeding: Independent Bathing: Needs assistance Is this a change from baseline?: Pre-admission baseline Toileting: Needs assistance Is this a change from baseline?: Pre-admission baseline In/Out Bed: Needs assistance Is this a change from baseline?: Pre-admission baseline Walks in Home: Needs assistance Is this a change from baseline?: Pre-admission baseline Does the patient have difficulty walking or climbing stairs?: Yes Weakness of Legs: Right Weakness of Arms/Hands: None  Permission Sought/Granted   Permission granted to share information with : Yes, Verbal Permission Granted  Share Information with NAME: Angela Mahoney (Spouse) [phone]           Emotional Assessment Appearance:: Appears stated age Attitude/Demeanor/Rapport: Gracious Affect (  typically observed): Accepting Orientation: : Oriented to Self, Oriented to Place, Oriented to  Time, Oriented to Situation Alcohol / Substance Use: Not Applicable Psych Involvement: No (comment)  Admission diagnosis:   Closed displaced trimalleolar fracture of right ankle [S82.851A] Patient Active Problem List   Diagnosis Date Noted   Closed displaced trimalleolar fracture of right ankle 07/31/2021   PCP:  [facility]:   [id number] G[place] Otto, [place] - [address] AT [facility] OF [facility] [address] [place] [address] Phone: [phone] Fax: [phone]     Social Determinants of Health (SDOH) Interventions    Readmission Risk Interventions No flowsheet data found.

## 2021-08-02 LAB — BASIC METABOLIC PANEL
Anion gap: 9 (ref 5–15)
BUN: 10 mg/dL (ref 8–23)
CO2: 25 mmol/L (ref 22–32)
Calcium: 9 mg/dL (ref 8.9–10.3)
Chloride: 109 mmol/L (ref 98–111)
Creatinine, Ser: 0.73 mg/dL (ref 0.44–1.00)
GFR, Estimated: >60 mL/min (ref >60)
Glucose, Bld: 70 mg/dL (ref 70–99)
Potassium: 3.1 mmol/L — ABNORMAL LOW (ref 3.5–5.1)
Sodium: 143 mmol/L (ref 135–145)

## 2021-08-02 LAB — CBC
HCT: 35.7 % — ABNORMAL LOW (ref 36.0–46.0)
Hemoglobin: 11.6 g/dL — ABNORMAL LOW (ref 12.0–15.0)
MCH: 29.5 pg (ref 26.0–34.0)
MCHC: 32.5 g/dL (ref 30.0–36.0)
MCV: 90.8 fL (ref 80.0–100.0)
Platelets: 241 10*3/uL (ref 150–400)
RBC: 3.93 MIL/uL (ref 3.87–5.11)
RDW: 13.8 % (ref 11.5–15.5)
WBC: 6.2 10*3/uL (ref 4.0–10.5)
nRBC: 0 % (ref 0.0–0.2)

## 2021-08-02 MED ORDER — POTASSIUM CHLORIDE 20 MEQ PO PACK
20.0000 meq | PACK | Freq: Once | ORAL | Status: AC
  Administered 2021-08-02: 20 meq via ORAL
  Filled 2021-08-02: qty 1

## 2021-08-02 NOTE — [place] FL2 (Signed)
[place] MEDICAID FL2 LEVEL OF CARE SCREENING TOOL     IDENTIFICATION  Patient Name: Angela Mahoney Birthdate: 02-04-1961 Sex: female Admission Date (Current Location): 07/31/2021  [facility] and [place] Number:  Producer, television/film/video and Address:  [facility], [address] [place], [place] [address]      Provider Number: [id number]  Attending Physician Name and Address:  Roby Lofts, MD  Relative Name and Phone Number:       Current Level of Care: Hospital Recommended Level of Care: Skilled Nursing Facility Prior Approval Number:    Date Approved/Denied:   PASRR Number: [id number]  Discharge Plan: SNF    Current Diagnoses: Patient Active Problem List   Diagnosis Date Noted   Closed displaced trimalleolar fracture of right ankle 07/31/2021    Orientation RESPIRATION BLADDER Height & Weight     Self, Time, Situation, Place  Normal Continent Weight: 223 lb (101.2 kg) Height:  5' 4.5" (163.8 cm)  BEHAVIORAL SYMPTOMS/MOOD NEUROLOGICAL BOWEL NUTRITION STATUS      Continent Diet (refer to d/c summary)  AMBULATORY STATUS COMMUNICATION OF NEEDS Skin   Total Care Verbally Surgical wounds (s/p external fixation of right ankle fracture,9/15)                       Personal Care Assistance Level of Assistance  Bathing, Feeding, Dressing Bathing Assistance: Maximum assistance Feeding assistance: Independent Dressing Assistance: Maximum assistance     Functional Limitations Info  Sight, Hearing, Speech Sight Info: Adequate Hearing Info: Adequate Speech Info: Adequate    SPECIAL CARE FACTORS FREQUENCY  PT (By licensed PT), OT (By licensed OT)     PT Frequency: 5x/week evaluate and treat OT Frequency: 5x/week evaluate and treat            Contractures Contractures Info: Not present    Additional Factors Info  Code Status, Allergies Code Status Info: Full Code Allergies Info: Zolpidem           Current  Medications (08/02/2021):  This is the current hospital active medication list Current Facility-Administered Medications  Medication Dose Route Frequency Provider Last Rate Last Admin   0.9 % NaCl with KCl 20 mEq/ L  infusion   Intravenous Continuous Despina Hidden, PA-C 20 mL/hr at 08/01/21 1500 Rate Change at 08/01/21 1500   acetaminophen (TYLENOL) tablet 325-650 mg  325-650 mg Oral Q6H PRN Ulyses Southward A, PA-C       albuterol (PROVENTIL) (2.5 MG/3ML) 0.083% nebulizer solution 2.5 mg  2.5 mg Nebulization Q6H PRN Haddix, Gillie Manners, MD       ARIPiprazole (ABILIFY) tablet 5 mg  5 mg Oral q morning Ulyses Southward A, PA-C   5 mg at 08/02/21 6378   aspirin EC tablet 81 mg  81 mg Oral Daily Despina Hidden, PA-C   81 mg at 08/02/21 0911   atorvastatin (LIPITOR) tablet 80 mg  80 mg Oral Q supper Despina Hidden, PA-C   80 mg at 08/01/21 1703   baclofen (LIORESAL) tablet 5 mg  5 mg Oral QHS Haddix, Gillie Manners, MD   5 mg at 08/01/21 2239   busPIRone (BUSPAR) tablet 15 mg  15 mg Oral TID Despina Hidden, PA-C   15 mg at 08/02/21 0911   docusate sodium (COLACE) capsule 100 mg  100 mg Oral BID Ulyses Southward A, PA-C   100 mg at 08/02/21 0911   DULoxetine (CYMBALTA) DR capsule 120 mg  120 mg Oral Daily Despina Hidden, PA-C   120 mg at 08/01/21 2238   enoxaparin (LOVENOX) injection 40 mg  40 mg Subcutaneous Q24H Despina Hidden, PA-C   40 mg at 08/02/21 0912   famotidine (PEPCID) tablet 10 mg  10 mg Oral Q breakfast Despina Hidden, PA-C   10 mg at 08/02/21 0814   fludrocortisone (FLORINEF) tablet 0.1 mg  0.1 mg Oral Daily Despina Hidden, PA-C   0.1 mg at 08/02/21 0910   fluticasone (FLONASE) 50 MCG/ACT nasal spray 1 spray  1 spray Each Nare Daily PRN Despina Hidden, PA-C       gabapentin (NEURONTIN) capsule 200 mg  200 mg Oral Q supper Haddix, Gillie Manners, MD   200 mg at 08/01/21 1702   gabapentin (NEURONTIN) capsule 900 mg  900 mg Oral BID Haddix, Gillie Manners, MD   900 mg at 08/02/21 4818   hydrALAZINE (APRESOLINE)  tablet 10 mg  10 mg Oral Q6H PRN Despina Hidden, PA-C       HYDROcodone-acetaminophen (NORCO) 7.5-325 MG per tablet 1-2 tablet  1-2 tablet Oral Q4H PRN Despina Hidden, PA-C   1 tablet at 08/02/21 5631   HYDROcodone-acetaminophen (NORCO/VICODIN) 5-325 MG per tablet 1-2 tablet  1-2 tablet Oral Q4H PRN Despina Hidden, PA-C   1 tablet at 08/01/21 2241   metoCLOPramide (REGLAN) tablet 5-10 mg  5-10 mg Oral Q8H PRN Despina Hidden, PA-C       Or   metoCLOPramide (REGLAN) injection 5-10 mg  5-10 mg Intravenous Q8H PRN Ulyses Southward A, PA-C       morphine 2 MG/ML injection 0.5-1 mg  0.5-1 mg Intravenous Q2H PRN Despina Hidden, PA-C       nitroGLYCERIN (NITROSTAT) SL tablet 0.4 mg  0.4 mg Sublingual Q5 min PRN Despina Hidden, PA-C       ondansetron [facility]) tablet 4 mg  4 mg Oral Q6H PRN Despina Hidden, PA-C       Or   ondansetron (ZOFRAN) injection 4 mg  4 mg Intravenous Q6H PRN Ulyses Southward A, PA-C       polyethylene glycol (MIRALAX / GLYCOLAX) packet 17 g  17 g Oral Daily PRN Despina Hidden, PA-C         Discharge Medications: Please see discharge summary for a list of discharge medications.  Relevant Imaging Results:  Relevant Lab Results:   Additional Information SS# [id number]  Lorri Frederick, LCSW

## 2021-08-02 NOTE — Progress Notes (Signed)
Orthopaedic Trauma Progress Note  SUBJECTIVE: Reports mild pain about operative site. Motivated to get better. No chest pain. No SOB. No nausea/vomiting. No other complaints.   OBJECTIVE:  Vitals:   08/02/21 0700 08/02/21 1625  BP: (!) 99/46 100/69  Pulse: 76 75  Resp: 17 18  Temp: 97.9 F (36.6 C) 98 F (36.7 C)  SpO2: 95% 99%    General: Sitting up in bed, NAD Respiratory: No increased work of breathing.  RLE: Ex fix in place, foot continues to be swollen. Pin sites dressings with minimal serosanguinous drainage. +DP pulse  IMAGING: Stable post op imaging.   LABS:  Results for orders placed or performed during the hospital encounter of 07/31/21 (from the past 24 hour(s))  Basic metabolic panel     Status: Abnormal   Collection Time: 08/02/21  2:23 AM  Result Value Ref Range   Sodium 143 135 - 145 mmol/L   Potassium 3.1 (L) 3.5 - 5.1 mmol/L   Chloride 109 98 - 111 mmol/L   CO2 25 22 - 32 mmol/L   Glucose, Bld 70 70 - 99 mg/dL   BUN 10 8 - 23 mg/dL   Creatinine, Ser 1.61 0.44 - 1.00 mg/dL   Calcium 9.0 8.9 - 09.6 mg/dL   GFR, Estimated >04 >54 mL/min   Anion gap 9 5 - 15  CBC     Status: Abnormal   Collection Time: 08/02/21  2:23 AM  Result Value Ref Range   WBC 6.2 4.0 - 10.5 K/uL   RBC 3.93 3.87 - 5.11 MIL/uL   Hemoglobin 11.6 (L) 12.0 - 15.0 g/dL   HCT 09.8 (L) 11.9 - 14.7 %   MCV 90.8 80.0 - 100.0 fL   MCH 29.5 26.0 - 34.0 pg   MCHC 32.5 30.0 - 36.0 g/dL   RDW 82.9 56.2 - 13.0 %   Platelets 241 150 - 400 K/uL   nRBC 0.0 0.0 - 0.2 %    ASSESSMENT: Angela Mahoney is a 60 y.o. female, 2 Days Post-OpXTERNAL FIXATION RIGHT ANKLE  CV/Blood loss: Acute blood loss anemia, Hgb 11.6 this AM. Hemodynamically stable  PLAN: Weightbearing: NWB RLE Incisional and dressing care: Pin site dressings PRN Showering: ok to shower with ex-fix in place, maintain ace wrap over ankle Orthopedic device(s):  Ex Fix RLE   Pain management: continue current regimen VTE  prophylaxis: Lovenox, SCDs ID:  Ancef 2gm post op Foley/Lines:  No foley, KVO IVFs Impediments to Fracture Healing: Vit D level 46, no supplementation needed Hypokalemia: one dose potassium supplement ordered Dispo: PT/OT recommending SNF. Definitive surgery will likely be next week depending on soft tissue swelling. Follow - up plan:  TBD  Contact information: After hours and holidays please check [email or web address] for group call information for [facility]   Alfonse Alpers, PA-C 08/02/2021

## 2021-08-02 NOTE — Plan of Care (Signed)

## 2021-08-02 NOTE — Progress Notes (Signed)
Physical Therapy Treatment Patient Details Name: Angela Mahoney MRN: [id number] DOB: 1961-10-17 Today's Date: 08/02/2021   History of Present Illness Pt is a 60 y/o female admitted 9/15 due to R trimalleolar ankle fracture dislocation, syndesmotic disruption s/p exfix application 9/15. Planned ORIF in 1-2 weeks.  Hx of ground level fall 9/9 with R ankle fx. PMH includes: COPD, CAD, fibromyalgia.    PT Comments    Pt making slow steady progress. Wants to be able to get into the bathroom and transfer to commode. Pt struggles with transfers toward rt side and would have to go in that direction to get onto commode. Will continue to work on improving transfers to allow pt to be more functional.    Recommendations for follow up therapy are one component of a multi-disciplinary discharge planning process, led by the attending physician.  Recommendations may be updated based on patient status, additional functional criteria and insurance authorization.  Follow Up Recommendations  SNF;Supervision for mobility/OOB     Equipment Recommendations  None recommended by PT    Recommendations for Other Services       Precautions / Restrictions Precautions Precautions: Fall;Other (comment) Precaution Comments: ex fix R ankle; soft BPs Restrictions Weight Bearing Restrictions: Yes RLE Weight Bearing: Non weight bearing     Mobility  Bed Mobility Overal bed mobility: Needs Assistance Bed Mobility: Supine to Sit;Sit to Supine     Supine to sit: Min guard Sit to supine: Min guard   General bed mobility comments: Assist for safety    Transfers Overall transfer level: Needs assistance Equipment used: Rolling walker (2 wheeled) Transfers: Sit to/from [employer];Lateral/Scoot Transfers Sit to Stand: Mod assist Stand pivot transfers: Min assist      Lateral/Scoot Transfers: Min assist General transfer comment: Mod assist to bring hips up and for balance. Stood from  bed with walker and then min assist to complete pivoting on lt foot to wheelchair toward lt side. Coming back from w/c to bed toward rt side pt used scooting transfer with armrest removed from w/c and min assist to scoot hips.  Ambulation/Gait                 Stairs             Wheelchair Mobility    Modified Rankin (Stroke Patients Only)       Balance Overall balance assessment: Needs assistance Sitting-balance support: No upper extremity supported;Feet supported Sitting balance-Leahy Scale: Good     Standing balance support: Bilateral upper extremity supported Standing balance-Leahy Scale: Poor Standing balance comment: walker and min assist for static standing. Stood x 30 sec with walker                            Cognition Arousal/Alertness: Awake/alert Behavior During Therapy: WFL for tasks assessed/performed Overall Cognitive Status: Within Functional Limits for tasks assessed                                        Exercises      General Comments        Pertinent Vitals/Pain Pain Assessment: Faces Faces Pain Scale: Hurts little more Pain Location: R ankle Pain Descriptors / Indicators: Guarding;Grimacing Pain Intervention(s): Limited activity within patient's tolerance;Monitored during session;Repositioned    Home Living  Prior Function            PT Goals (current goals can now be found in the care plan section) Acute Rehab PT Goals Patient Stated Goal: to get better, to be able to fully move into new apt Progress towards PT goals: Progressing toward goals    Frequency    Min 3X/week      PT Plan Current plan remains appropriate    Co-evaluation              AM-PAC PT "6 Clicks" Mobility   Outcome Measure  Help needed turning from your back to your side while in a flat bed without using bedrails?: A Little Help needed moving from lying on your back to sitting on  the side of a flat bed without using bedrails?: A Little Help needed moving to and from a bed to a chair (including a wheelchair)?: A Lot Help needed standing up from a chair using your arms (e.g., wheelchair or bedside chair)?: A Lot Help needed to walk in hospital room?: Total Help needed climbing 3-5 steps with a railing? : Total 6 Click Score: 12    End of Session Equipment Utilized During Treatment: Gait belt Activity Tolerance: Patient tolerated treatment well Patient left: in bed;with call bell/phone within reach;with family/visitor present   PT Visit Diagnosis: Unsteadiness on feet (R26.81);Repeated falls (R29.6);Muscle weakness (generalized) (M62.81);Pain Pain - Right/Left: Right Pain - part of body: Ankle and joints of foot     Time: 1402-1430 PT Time Calculation (min) (ACUTE ONLY): 28 min  Charges:  $Therapeutic Exercise: 23-37 mins                     [facility] PT Acute Rehabilitation Services Pager [phone] Office [phone]    Angelina Ok [facility] 08/02/2021, 4:13 PM

## 2021-08-02 NOTE — TOC Progression Note (Signed)
Transition of Care [facility]) - Progression Note    Patient Details  Name: Angela Mahoney MRN: [id number] Date of Birth: [birth year]  [facility]) CM/SW Contact  Lorri Frederick, LCSW Phone Number: 08/02/2021, 10:21 AM  Clinical Narrative:   Clovis Cao updated after PT evaluation, pt referral sent out in hub for SNF.     Expected Discharge Plan: Skilled Nursing Facility Barriers to Discharge: Continued Medical Work up  Expected Discharge Plan and Services Expected Discharge Plan: Skilled Nursing Facility   Discharge Planning Services: CM Consult                                           Social Determinants of Health (SDOH) Interventions    Readmission Risk Interventions No flowsheet data found.

## 2021-08-03 LAB — BASIC METABOLIC PANEL WITH GFR
Anion gap: 6 (ref 5–15)
BUN: 8 mg/dL (ref 8–23)
CO2: 27 mmol/L (ref 22–32)
Calcium: 8.9 mg/dL (ref 8.9–10.3)
Chloride: 108 mmol/L (ref 98–111)
Creatinine, Ser: 0.79 mg/dL (ref 0.44–1.00)
GFR, Estimated: >60 mL/min
Glucose, Bld: 87 mg/dL (ref 70–99)
Potassium: 3.6 mmol/L (ref 3.5–5.1)
Sodium: 141 mmol/L (ref 135–145)

## 2021-08-03 NOTE — Progress Notes (Signed)
    Subjective: Patient reports pain as mild to moderate. Tolerating diet. Urinating. No CP, SOB. Working with PT on mobilization OOB.   Objective:   VITALS:   Vitals:   08/02/21 1625 08/02/21 2200 08/03/21 0526 08/03/21 0759  BP: 100/69 112/80 108/74 110/80  Pulse: 75 82 80 72  Resp: 18 18 18 18   Temp: 98 F (36.7 C) 98.1 F (36.7 C) 98 F (36.7 C) 98.2 F (36.8 C)  TempSrc: Oral Oral Oral Oral  SpO2: 99% 98% 98% 98%  Weight:      Height:       CBC Latest Ref Rng & Units 08/02/2021 08/01/2021 07/31/2021  WBC 4.0 - 10.5 K/uL 6.2 6.0 4.1  Hemoglobin 12.0 - 15.0 g/dL 11.6(L) 11.7(L) 12.3  Hematocrit 36.0 - 46.0 % 35.7(L) 35.6(L) 37.7  Platelets 150 - 400 K/uL 241 237 242   BMP Latest Ref Rng & Units 08/03/2021 08/02/2021 08/01/2021  Glucose 70 - 99 mg/dL 87 70 08/03/2021)  BUN 8 - 23 mg/dL 8 10 14   Creatinine 0.44 - 1.00 mg/dL 706(C 3.76  Sodium 135 - 145 mmol/L 141 143 140  Potassium 3.5 - 5.1 mmol/L 3.6 3.1(L) 3.9  Chloride 98 - 111 mmol/L 108 109 107  CO2 22 - 32 mmol/L 27 25 26   Calcium 8.9 - 10.3 mg/dL 8.9 9.0 9.1   Intake/Output      09/17 0701 09/18 0700 09/18 0701 09/19 0700   P.O. 480    Total Intake(mL/kg) 480 (4.7)    Urine (mL/kg/hr)     Stool     Total Output     Net +480         Urine Occurrence 3 x       Physical Exam: General: NAD.  Laying in bed. Calm, comfortable Resp: No increased wob Cardio: regular rate and rhythm ABD soft Neurologically intact MSK Neurovascularly intact Sensation intact distally Intact pulses distally Can move toes Ex fix in place Moderate edema Incisions: dressing C/D/I, minimal drainage around pin sites   Assessment: 3 Days Post-Op  S/P Procedure(s) (LRB): External Fixation Ankle (Right) by Dr. 10/18 on 07/31/21  Active Problems:   Closed displaced trimalleolar fracture of right ankle    Plan: Hoping to get swelling down and be able to return to OR for fixation this week  Advance diet Incentive  Spirometry Elevate and Apply ice  Weightbearing: NWB RLE Incisional and dressing care: Pin site dressings PRN Showering: ok to shower with ex-fix in place, maintain ace wrap over ankle Orthopedic device(s): Ex Fix RLE  Pain management: continue current regimen VTE prophylaxis: Lovenox, SCDs ID:  Ancef 2gm post op Foley/Lines:  No foley, KVO IVFs Impediments to Fracture Healing: Vit D level 46, no supplementation needed Hypokalemia: one dose potassium supplement ordered Dispo: PT/OT recommending SNF. Definitive surgery will likely be next week depending on soft tissue swelling. Follow - up plan: TBD  Contact information: After hours and holidays please check [email or web address] for group call information for [facility]   10/19 Office Jena Gauss 08/03/2021, 11:11 AM

## 2021-08-03 NOTE — Plan of Care (Signed)

## 2021-08-04 MED ORDER — CEFAZOLIN SODIUM-DEXTROSE 2-4 GM/100ML-% IV SOLN
2.0000 g | INTRAVENOUS | Status: AC
  Administered 2021-08-06: 2 g via INTRAVENOUS
  Filled 2021-08-04 (×2): qty 100

## 2021-08-04 NOTE — Care Management Important Message (Signed)
Important Message  Patient Details  Name: Merion Caton MRN: [id number] Date of Birth: [birth year]   Medicare Important Message Given:  Yes     Mardene Sayer 08/04/2021, 1:07 PM

## 2021-08-04 NOTE — Care Management Important Message (Signed)
Important Message  Patient Details  Name: Angela Mahoney MRN: [id number] Date of Birth: [birth year]   Medicare Important Message Given:  Yes     Aylani Spurlock Stefan Church 08/04/2021, 3:43 PM

## 2021-08-04 NOTE — Progress Notes (Signed)
Physical Therapy Treatment Patient Details Name: Angela Mahoney MRN: [id number] DOB: 04-17-1961 Today's Date: 08/04/2021   History of Present Illness Pt is a 60 y/o female admitted 9/15 due to R trimalleolar ankle fracture dislocation, syndesmotic disruption s/p exfix application 9/15. Planned ORIF in 1-2 weeks.  Hx of ground level fall 9/9 with R ankle fx. PMH includes: COPD, CAD, fibromyalgia.    PT Comments    Continuing work on functional mobility and activity tolerance;  Seemed encouraged to be able to get up and transfer towards her R side today; hopeful for OR for her R ankle on Wed of this week   Recommendations for follow up therapy are one component of a multi-disciplinary discharge planning process, led by the attending physician.  Recommendations may be updated based on patient status, additional functional criteria and insurance authorization.  Follow Up Recommendations  SNF;Supervision for mobility/OOB     Equipment Recommendations  None recommended by PT    Recommendations for Other Services       Precautions / Restrictions Precautions Precautions: Fall;Other (comment) Precaution Comments: ex fix R ankle; soft BPs Restrictions RLE Weight Bearing: Non weight bearing     Mobility  Bed Mobility Overal bed mobility: Needs Assistance Bed Mobility: Supine to Sit     Supine to sit: Min guard     General bed mobility comments: Assist for safety    Transfers Overall transfer level: Needs assistance   Transfers: Lateral/Scoot Transfers          Lateral/Scoot Transfers: Min guard General transfer comment: performed lateral scoot bed to recliner towards more difficult R side; this PT gave demonstration cues prior to transferrring; Overall minguard assist to steady recliner; short inefficient and energetically taxing scoots; able to smooth them out more with cues  Ambulation/Gait                 Stairs             Wheelchair Mobility     Modified Rankin (Stroke Patients Only)       Balance     Sitting balance-Leahy Scale: Good                                      Cognition Arousal/Alertness: Awake/alert Behavior During Therapy: WFL for tasks assessed/performed Overall Cognitive Status: Within Functional Limits for tasks assessed                                        Exercises      General Comments        Pertinent Vitals/Pain Pain Assessment: Faces Faces Pain Scale: Hurts little more Pain Location: R ankle Pain Descriptors / Indicators: Guarding;Grimacing Pain Intervention(s): Monitored during session    Home Living                      Prior Function            PT Goals (current goals can now be found in the care plan section) Acute Rehab PT Goals Patient Stated Goal: to get better, to be able to fully move into new apt PT Goal Formulation: With patient Time For Goal Achievement: 08/15/21 Potential to Achieve Goals: Good Progress towards PT goals: Progressing toward goals    Frequency    Min 3X/week  PT Plan Current plan remains appropriate;Other (comment) (may be able to update to home, depending on functional mobility postop)    Co-evaluation              AM-PAC PT "6 Clicks" Mobility   Outcome Measure  Help needed turning from your back to your side while in a flat bed without using bedrails?: A Little Help needed moving from lying on your back to sitting on the side of a flat bed without using bedrails?: A Little Help needed moving to and from a bed to a chair (including a wheelchair)?: A Lot Help needed standing up from a chair using your arms (e.g., wheelchair or bedside chair)?: A Lot Help needed to walk in hospital room?: Total Help needed climbing 3-5 steps with a railing? : Total 6 Click Score: 12    End of Session   Activity Tolerance: Patient tolerated treatment well Patient left: in chair;with call bell/phone  within reach Nurse Communication: Mobility status PT Visit Diagnosis: Unsteadiness on feet (R26.81);Repeated falls (R29.6);Muscle weakness (generalized) (M62.81);Pain Pain - Right/Left: Right Pain - part of body: Ankle and joints of foot     Time: 1410-1430 PT Time Calculation (min) (ACUTE ONLY): 20 min  Charges:  $Therapeutic Activity: 8-22 mins                     Van Clines, PT  Acute Rehabilitation Services Pager [phone] Office [phone]    Levi Aland 08/04/2021, 4:39 PM

## 2021-08-04 NOTE — Progress Notes (Signed)
Orthopaedic Trauma Progress Note  SUBJECTIVE: Doing fairly well today. Pain controlled. Tolerating diet and fluids. + Flatus. No BM. Wants to try getting up to bedside commode today, doesn't like the bedpan. Is hopeful we will be able to proceed with surgery on her ankle later this week.   OBJECTIVE:  Vitals:   08/04/21 0550 08/04/21 0802  BP: 120/80 112/72  Pulse: 78 72  Resp: 19 16  Temp: 98 F (36.7 C) 97.9 F (36.6 C)  SpO2:  99%    General: Sitting up in bed, NAD Respiratory: No increased work of breathing.  RLE: Ex fix in place, foot swollen. Ankle swelling improving from last week, but still no significant skin wrinkling.  Pin sites dressings with minimal serosanguinous drainage. +DP pulse  IMAGING: Stable post op imaging.   LABS:  No results found for this or any previous visit (from the past 24 hour(s)).   ASSESSMENT: Angela Mahoney is a 60 y.o. female, 4 Days Post-Op EXTERNAL FIXATION RIGHT ANKLE  CV/Blood loss: Hgb stable. Hemodynamically stable  PLAN: Weightbearing: NWB RLE Incisional and dressing care: Pin site dressings PRN Showering: ok to shower with ex-fix in place, maintain ace wrap over ankle Orthopedic device(s):  Ex Fix RLE   Pain management: continue current regimen VTE prophylaxis: Lovenox, SCDs ID:  Ancef 2gm post op completed Foley/Lines:  No foley, KVO IVFs Impediments to Fracture Healing: Vit D level 46, no supplementation needed Dispo: PT/OT as tolerated, currently recommending SNF at discharge.  Will continue to monitor soft tissue swelling, with plans to proceed with definitive surgery later this week (earliest would be Wednesday). Discussed with daughter Angela Mahoney) on the phone, she is in agreement with plan  Follow - up plan:  TBD  Contact information:  Angela Merle MD, Angela Southward PA-C. After hours and holidays please check [email or web address] for group call information for [facility]   Angela Mahoney A. Michaelyn Barter, PA-C [phone]  (office) [email or web address]

## 2021-08-05 NOTE — Plan of Care (Signed)

## 2021-08-05 NOTE — Anesthesia Preprocedure Evaluation (Addendum)
Anesthesia Evaluation  Patient identified by MRN, date of birth, ID band Patient awake    Reviewed: Allergy & Precautions, NPO status , Patient's Chart, lab work & pertinent test results  History of Anesthesia Complications Negative for: history of anesthetic complications  Airway Mallampati: II  TM Distance: >3 FB Neck ROM: Full    Dental  (+) Edentulous Upper, Edentulous Lower   Pulmonary sleep apnea and Continuous Positive Airway Pressure Ventilation , COPD,  COPD inhaler, former smoker,  07/31/2021 SARS coronavirus NEG   breath sounds clear to auscultation       Cardiovascular + CAD (?)   Rhythm:Regular Rate:Normal  06/2021 ECHO: EF 55-60%, normal LVF, no significant valvular abnormalities 06/2021 Stress: normal perfusion, no ischemia, EF >67%   Neuro/Psych  Headaches, Anxiety Depression    GI/Hepatic Neg liver ROS, GERD  Poorly Controlled,  Endo/Other  Hypothyroidism Morbid obesity  Renal/GU negative Renal ROS     Musculoskeletal  (+) Arthritis , Fibromyalgia -  Abdominal (+) + obese,   Peds  Hematology negative hematology ROS (+)   Anesthesia Other Findings   Reproductive/Obstetrics                            Anesthesia Physical Anesthesia Plan  ASA: 3  Anesthesia Plan: General   Post-op Pain Management: GA combined w/ Regional for post-op pain   Induction: Intravenous  PONV Risk Score and Plan: 3 and Ondansetron and Dexamethasone  Airway Management Planned: Oral ETT  Additional Equipment: None  Intra-op Plan:   Post-operative Plan: Extubation in OR  Informed Consent: I have reviewed the patients History and Physical, chart, labs and discussed the procedure including the risks, benefits and alternatives for the proposed anesthesia with the patient or authorized representative who has indicated his/her understanding and acceptance.       Plan Discussed with: CRNA and  Surgeon  Anesthesia Plan Comments: (Plan routine monitors, GA with popliteal and adductor blocks for post op analgesia)       Anesthesia Quick Evaluation

## 2021-08-05 NOTE — H&P (View-Only) (Signed)
Orthopaedic Trauma Progress Note  SUBJECTIVE: Doing fairly well today. Pain controlled. Tolerating diet and fluids. + Flatus. No BM. Is hopeful we will be able to proceed with surgery on her ankle tomorrow.  OBJECTIVE:  Vitals:   08/04/21 2243 08/05/21 0819  BP: 91/61 104/69  Pulse: 81 87  Resp: 18 16  Temp: 97.6 F (36.4 C) 98.1 F (36.7 C)  SpO2: 95% 100%    General: Sitting up in bed, NAD Respiratory: No increased work of breathing.  RLE: Ex fix in place, foot swollen. Ankle swelling improving. Small amount of skin wrinkling.  Pin sites dressings with minimal serosanguinous drainage. +DP pulse  IMAGING: Stable post op imaging.   LABS:  No results found for this or any previous visit (from the past 24 hour(s)).   ASSESSMENT: Angela Mahoney is a 60 y.o. female, 5 Days Post-Op EXTERNAL FIXATION RIGHT ANKLE  CV/Blood loss: Hgb stable. Hemodynamically stable  PLAN: Weightbearing: NWB RLE Incisional and dressing care: Pin site dressings PRN Showering: ok to shower with ex-fix in place, maintain ace wrap over ankle Orthopedic device(s):  Ex Fix RLE   Pain management: continue current regimen VTE prophylaxis: Lovenox, SCDs ID:  Ancef 2gm post op completed Foley/Lines:  No foley, KVO IVFs Impediments to Fracture Healing: Vit D level 46, no supplementation needed Dispo: PT/OT as tolerated, currently recommending SNF at discharge.  Plan for ex-fix removal and ORIF R ankle tomorrow morning. Continue to ice and elevate ankle throughout the day. NPO midnight. Consent to be signed.   Follow - up plan:  TBD  Contact information:  Kevin Haddix MD, Jp Eastham PA-C. After hours and holidays please check [email or web address] for group call information for [facility]   Almas Rake A. Chelsy Parrales, PA-C [phone] (office) [email or web address]    

## 2021-08-05 NOTE — Progress Notes (Signed)
Orthopaedic Trauma Progress Note  SUBJECTIVE: Doing fairly well today. Pain controlled. Tolerating diet and fluids. + Flatus. No BM. Is hopeful we will be able to proceed with surgery on her ankle tomorrow.  OBJECTIVE:  Vitals:   08/04/21 2243 08/05/21 0819  BP: 91/61 104/69  Pulse: 81 87  Resp: 18 16  Temp: 97.6 F (36.4 C) 98.1 F (36.7 C)  SpO2: 95% 100%    General: Sitting up in bed, NAD Respiratory: No increased work of breathing.  RLE: Ex fix in place, foot swollen. Ankle swelling improving. Small amount of skin wrinkling.  Pin sites dressings with minimal serosanguinous drainage. +DP pulse  IMAGING: Stable post op imaging.   LABS:  No results found for this or any previous visit (from the past 24 hour(s)).   ASSESSMENT: Angela Mahoney is a 60 y.o. female, 5 Days Post-Op EXTERNAL FIXATION RIGHT ANKLE  CV/Blood loss: Hgb stable. Hemodynamically stable  PLAN: Weightbearing: NWB RLE Incisional and dressing care: Pin site dressings PRN Showering: ok to shower with ex-fix in place, maintain ace wrap over ankle Orthopedic device(s):  Ex Fix RLE   Pain management: continue current regimen VTE prophylaxis: Lovenox, SCDs ID:  Ancef 2gm post op completed Foley/Lines:  No foley, KVO IVFs Impediments to Fracture Healing: Vit D level 46, no supplementation needed Dispo: PT/OT as tolerated, currently recommending SNF at discharge.  Plan for ex-fix removal and ORIF R ankle tomorrow morning. Continue to ice and elevate ankle throughout the day. NPO midnight. Consent to be signed.   Follow - up plan:  TBD  Contact information:  Truitt Merle MD, Ulyses Southward PA-C. After hours and holidays please check [email or web address] for group call information for [facility]   Angela Muston A. Michaelyn Barter, PA-C [phone] (office) [email or web address]

## 2021-08-06 ENCOUNTER — Encounter (HOSPITAL_COMMUNITY): Payer: Self-pay | Admitting: Student

## 2021-08-06 ENCOUNTER — Inpatient Hospital Stay (HOSPITAL_COMMUNITY): Payer: Medicare (Managed Care)

## 2021-08-06 ENCOUNTER — Encounter (HOSPITAL_COMMUNITY)
Admission: RE | Disposition: A | Discharge status: Skilled Nursing Facility | Payer: Self-pay | Source: Home / Self Care | Attending: Student

## 2021-08-06 ENCOUNTER — Inpatient Hospital Stay (HOSPITAL_COMMUNITY): Payer: Medicare (Managed Care) | Admitting: Certified Registered Nurse Anesthetist

## 2021-08-06 HISTORY — PX: EXTERNAL FIXATION REMOVAL: SHX5040

## 2021-08-06 HISTORY — PX: ORIF ANKLE FRACTURE: SHX5408

## 2021-08-06 SURGERY — REMOVAL, EXTERNAL FIXATION DEVICE, LOWER EXTREMITY
Anesthesia: General | Site: Ankle | Laterality: Right

## 2021-08-06 MED ORDER — METHOCARBAMOL 500 MG PO TABS
500.0000 mg | ORAL_TABLET | Freq: Four times a day (QID) | ORAL | Status: DC | PRN
  Administered 2021-08-07: 500 mg via ORAL
  Filled 2021-08-06 (×2): qty 1

## 2021-08-06 MED ORDER — HYDROMORPHONE HCL 1 MG/ML IJ SOLN
0.2500 mg | INTRAMUSCULAR | Status: DC | PRN
  Administered 2021-08-06 (×2): 0.5 mg via INTRAVENOUS

## 2021-08-06 MED ORDER — CEFAZOLIN SODIUM-DEXTROSE 2-4 GM/100ML-% IV SOLN
2.0000 g | Freq: Three times a day (TID) | INTRAVENOUS | Status: AC
  Administered 2021-08-06 – 2021-08-07 (×3): 2 g via INTRAVENOUS
  Filled 2021-08-06 (×3): qty 100

## 2021-08-06 MED ORDER — PROPOFOL 10 MG/ML IV BOLUS
INTRAVENOUS | Status: AC
  Filled 2021-08-06: qty 20

## 2021-08-06 MED ORDER — FENTANYL CITRATE (PF) 250 MCG/5ML IJ SOLN
INTRAMUSCULAR | Status: AC
  Filled 2021-08-06: qty 5

## 2021-08-06 MED ORDER — MEPERIDINE HCL 25 MG/ML IJ SOLN: 6.2500 mg | INTRAMUSCULAR | Status: DC | PRN

## 2021-08-06 MED ORDER — METHOCARBAMOL 1000 MG/10ML IJ SOLN
500.0000 mg | Freq: Four times a day (QID) | INTRAVENOUS | Status: DC | PRN
  Filled 2021-08-06: qty 5

## 2021-08-06 MED ORDER — PROMETHAZINE HCL 25 MG/ML IJ SOLN: 6.2500 mg | INTRAMUSCULAR | Status: DC | PRN

## 2021-08-06 MED ORDER — ACETAMINOPHEN 500 MG PO TABS
1000.0000 mg | ORAL_TABLET | Freq: Once | ORAL | Status: AC
  Administered 2021-08-06: 1000 mg via ORAL
  Filled 2021-08-06: qty 2

## 2021-08-06 MED ORDER — OXYCODONE HCL 5 MG PO TABS: 5.0000 mg | ORAL_TABLET | Freq: Once | ORAL | Status: DC | PRN

## 2021-08-06 MED ORDER — 0.9 % SODIUM CHLORIDE (POUR BTL) OPTIME
TOPICAL | Status: DC | PRN
  Administered 2021-08-06: 1000 mL

## 2021-08-06 MED ORDER — HYDROCODONE-ACETAMINOPHEN 7.5-325 MG PO TABS
1.0000 | ORAL_TABLET | ORAL | Status: DC | PRN
  Administered 2021-08-06 – 2021-08-07 (×6): 1 via ORAL
  Administered 2021-08-08 (×3): 2 via ORAL
  Filled 2021-08-06: qty 2
  Filled 2021-08-06 (×5): qty 1
  Filled 2021-08-06 (×2): qty 2
  Filled 2021-08-06: qty 1

## 2021-08-06 MED ORDER — SCOPOLAMINE 1 MG/3DAYS TD PT72
1.0000 | MEDICATED_PATCH | TRANSDERMAL | Status: DC
  Administered 2021-08-06: 1.5 mg via TRANSDERMAL
  Filled 2021-08-06: qty 1

## 2021-08-06 MED ORDER — ONDANSETRON HCL 4 MG PO TABS: 4.0000 mg | ORAL_TABLET | Freq: Four times a day (QID) | ORAL | Status: DC | PRN

## 2021-08-06 MED ORDER — LIDOCAINE HCL (PF) 2 % IJ SOLN
INTRAMUSCULAR | Status: AC
  Filled 2021-08-06: qty 5

## 2021-08-06 MED ORDER — DEXAMETHASONE SODIUM PHOSPHATE 10 MG/ML IJ SOLN
INTRAMUSCULAR | Status: AC
  Filled 2021-08-06: qty 1

## 2021-08-06 MED ORDER — DEXAMETHASONE SODIUM PHOSPHATE 10 MG/ML IJ SOLN
INTRAMUSCULAR | Status: DC | PRN
  Administered 2021-08-06: 10 mg via INTRAVENOUS

## 2021-08-06 MED ORDER — EPHEDRINE 5 MG/ML INJ
INTRAVENOUS | Status: AC
  Filled 2021-08-06: qty 5

## 2021-08-06 MED ORDER — VANCOMYCIN HCL 1000 MG IV SOLR
INTRAVENOUS | Status: DC | PRN
  Administered 2021-08-06: 1000 mg via TOPICAL

## 2021-08-06 MED ORDER — METOCLOPRAMIDE HCL 5 MG/ML IJ SOLN: 5.0000 mg | Freq: Three times a day (TID) | INTRAMUSCULAR | Status: DC | PRN

## 2021-08-06 MED ORDER — CELECOXIB 200 MG PO CAPS
200.0000 mg | ORAL_CAPSULE | Freq: Two times a day (BID) | ORAL | Status: DC
  Administered 2021-08-06 – 2021-08-08 (×5): 200 mg via ORAL
  Filled 2021-08-06 (×5): qty 1

## 2021-08-06 MED ORDER — PROPOFOL 10 MG/ML IV BOLUS
INTRAVENOUS | Status: DC | PRN
  Administered 2021-08-06: 110 mg via INTRAVENOUS

## 2021-08-06 MED ORDER — MIDAZOLAM HCL 2 MG/2ML IJ SOLN
INTRAMUSCULAR | Status: AC
  Filled 2021-08-06: qty 2

## 2021-08-06 MED ORDER — PHENYLEPHRINE 40 MCG/ML (10ML) SYRINGE FOR IV PUSH (FOR BLOOD PRESSURE SUPPORT)
PREFILLED_SYRINGE | INTRAVENOUS | Status: AC
  Filled 2021-08-06: qty 10

## 2021-08-06 MED ORDER — ORAL CARE MOUTH RINSE: 15.0000 mL | Freq: Once | OROMUCOSAL | Status: AC

## 2021-08-06 MED ORDER — ROCURONIUM BROMIDE 10 MG/ML (PF) SYRINGE
PREFILLED_SYRINGE | INTRAVENOUS | Status: AC
  Filled 2021-08-06: qty 10

## 2021-08-06 MED ORDER — PHENYLEPHRINE 40 MCG/ML (10ML) SYRINGE FOR IV PUSH (FOR BLOOD PRESSURE SUPPORT)
PREFILLED_SYRINGE | INTRAVENOUS | Status: DC | PRN
  Administered 2021-08-06 (×2): 120 ug via INTRAVENOUS

## 2021-08-06 MED ORDER — MIDAZOLAM HCL 2 MG/2ML IJ SOLN: 0.5000 mg | Freq: Once | INTRAMUSCULAR | Status: DC | PRN

## 2021-08-06 MED ORDER — CHLORHEXIDINE GLUCONATE 0.12 % MT SOLN
OROMUCOSAL | Status: AC
  Filled 2021-08-06: qty 15

## 2021-08-06 MED ORDER — ROCURONIUM BROMIDE 100 MG/10ML IV SOLN
INTRAVENOUS | Status: DC | PRN
  Administered 2021-08-06: 50 mg via INTRAVENOUS

## 2021-08-06 MED ORDER — BUPIVACAINE-EPINEPHRINE (PF) 0.5% -1:200000 IJ SOLN
INTRAMUSCULAR | Status: DC | PRN
  Administered 2021-08-06: 20 mL via PERINEURAL
  Administered 2021-08-06: 10 mL via PERINEURAL

## 2021-08-06 MED ORDER — SUGAMMADEX SODIUM 200 MG/2ML IV SOLN
INTRAVENOUS | Status: DC | PRN
  Administered 2021-08-06: 200 mg via INTRAVENOUS

## 2021-08-06 MED ORDER — METOCLOPRAMIDE HCL 5 MG PO TABS: 5.0000 mg | ORAL_TABLET | Freq: Three times a day (TID) | ORAL | Status: DC | PRN

## 2021-08-06 MED ORDER — OXYCODONE HCL 5 MG/5ML PO SOLN
5.0000 mg | Freq: Once | ORAL | Status: DC | PRN
Start: 2021-08-06 — End: 2021-08-06

## 2021-08-06 MED ORDER — VANCOMYCIN HCL 1000 MG IV SOLR
INTRAVENOUS | Status: AC
  Filled 2021-08-06: qty 20

## 2021-08-06 MED ORDER — OXYCODONE HCL 5 MG PO TABS
5.0000 mg | ORAL_TABLET | ORAL | Status: DC | PRN
  Administered 2021-08-07 (×2): 10 mg via ORAL
  Filled 2021-08-06 (×2): qty 2

## 2021-08-06 MED ORDER — LIDOCAINE HCL (CARDIAC) PF 100 MG/5ML IV SOSY
PREFILLED_SYRINGE | INTRAVENOUS | Status: DC | PRN
  Administered 2021-08-06: 10 mg via INTRAVENOUS

## 2021-08-06 MED ORDER — LACTATED RINGERS IV SOLN: INTRAVENOUS | Status: DC

## 2021-08-06 MED ORDER — PHENYLEPHRINE HCL-NACL 20-0.9 MG/250ML-% IV SOLN
INTRAVENOUS | Status: DC | PRN
  Administered 2021-08-06: 20 ug/min via INTRAVENOUS

## 2021-08-06 MED ORDER — BUPIVACAINE-EPINEPHRINE (PF) 0.25% -1:200000 IJ SOLN
INTRAMUSCULAR | Status: AC
  Filled 2021-08-06: qty 30

## 2021-08-06 MED ORDER — MIDAZOLAM HCL 5 MG/5ML IJ SOLN
INTRAMUSCULAR | Status: DC | PRN
  Administered 2021-08-06: 2 mg via INTRAVENOUS

## 2021-08-06 MED ORDER — ONDANSETRON HCL 4 MG/2ML IJ SOLN: 4.0000 mg | Freq: Four times a day (QID) | INTRAMUSCULAR | Status: DC | PRN

## 2021-08-06 MED ORDER — ONDANSETRON HCL 4 MG/2ML IJ SOLN
INTRAMUSCULAR | Status: AC
  Filled 2021-08-06: qty 2

## 2021-08-06 MED ORDER — CHLORHEXIDINE GLUCONATE 0.12 % MT SOLN
15.0000 mL | Freq: Once | OROMUCOSAL | Status: AC
  Administered 2021-08-06: 15 mL via OROMUCOSAL

## 2021-08-06 MED ORDER — HYDROMORPHONE HCL 1 MG/ML IJ SOLN
INTRAMUSCULAR | Status: AC
  Filled 2021-08-06: qty 1

## 2021-08-06 MED ORDER — ONDANSETRON HCL 4 MG/2ML IJ SOLN
INTRAMUSCULAR | Status: DC | PRN
  Administered 2021-08-06: 4 mg via INTRAVENOUS

## 2021-08-06 MED ORDER — FENTANYL CITRATE (PF) 100 MCG/2ML IJ SOLN
INTRAMUSCULAR | Status: DC | PRN
  Administered 2021-08-06: 50 ug via INTRAVENOUS
  Administered 2021-08-06 (×2): 100 ug via INTRAVENOUS

## 2021-08-06 SURGICAL SUPPLY — 76 items
BAG COUNTER SPONGE SURGICOUNT (BAG) ×3 IMPLANT
BANDAGE ESMARK 6X9 LF (GAUZE/BANDAGES/DRESSINGS) ×2 IMPLANT
BIT DRILL QC 2.0 SHORT EVOS SM (DRILL) IMPLANT
BIT DRILL QC 2.5MM SHRT EVO SM (DRILL) IMPLANT
BNDG COHESIVE 4X5 TAN STRL (GAUZE/BANDAGES/DRESSINGS) ×3 IMPLANT
BNDG ELASTIC 4X5.8 VLCR STR LF (GAUZE/BANDAGES/DRESSINGS) ×3 IMPLANT
BNDG ELASTIC 6X5.8 VLCR STR LF (GAUZE/BANDAGES/DRESSINGS) ×3 IMPLANT
BNDG ESMARK 6X9 LF (GAUZE/BANDAGES/DRESSINGS) ×3
BNDG GAUZE ELAST 4 BULKY (GAUZE/BANDAGES/DRESSINGS) ×6 IMPLANT
BRUSH SCRUB EZ PLAIN DRY (MISCELLANEOUS) ×6 IMPLANT
CHLORAPREP W/TINT 26 (MISCELLANEOUS) ×3 IMPLANT
COVER SURGICAL LIGHT HANDLE (MISCELLANEOUS) ×6 IMPLANT
DRAPE C-ARM 42X72 X-RAY (DRAPES) ×3 IMPLANT
DRAPE C-ARMOR (DRAPES) ×3 IMPLANT
DRAPE ORTHO SPLIT 77X108 STRL (DRAPES) ×2
DRAPE SURG ORHT 6 SPLT 77X108 (DRAPES) ×4 IMPLANT
DRAPE U-SHAPE 47X51 STRL (DRAPES) ×3 IMPLANT
DRILL QC 2.0 SHORT EVOS SM (DRILL) ×3
DRILL QC 2.5MM SHORT EVOS SM (DRILL) ×3
DRSG ADAPTIC 3X8 NADH LF (GAUZE/BANDAGES/DRESSINGS) ×3 IMPLANT
DRSG MEPITEL 8X12 (GAUZE/BANDAGES/DRESSINGS) ×1 IMPLANT
ELECT REM PT RETURN 9FT ADLT (ELECTROSURGICAL) ×3
ELECTRODE REM PT RTRN 9FT ADLT (ELECTROSURGICAL) ×2 IMPLANT
GAUZE SPONGE 4X4 12PLY STRL (GAUZE/BANDAGES/DRESSINGS) ×3 IMPLANT
GAUZE SPONGE 4X4 12PLY STRL LF (GAUZE/BANDAGES/DRESSINGS) ×1 IMPLANT
GLOVE SURG ENC MOIS LTX SZ6.5 (GLOVE) ×9 IMPLANT
GLOVE SURG ENC MOIS LTX SZ7.5 (GLOVE) ×12 IMPLANT
GLOVE SURG UNDER POLY LF SZ6.5 (GLOVE) ×3 IMPLANT
GLOVE SURG UNDER POLY LF SZ7.5 (GLOVE) ×3 IMPLANT
GOWN STRL REUS W/ TWL LRG LVL3 (GOWN DISPOSABLE) ×4 IMPLANT
GOWN STRL REUS W/TWL LRG LVL3 (GOWN DISPOSABLE) ×2
K-WIRE 1.6 (WIRE) ×1
K-WIRE FX150X1.6XTROC PNT (WIRE) ×2
KIT BASIN OR (CUSTOM PROCEDURE TRAY) ×3 IMPLANT
KIT INVISIKNOT ANKLE FRACTURE (Screw) ×1 IMPLANT
KIT TURNOVER KIT B (KITS) ×3 IMPLANT
KWIRE FX150X1.6XTROC PNT (WIRE) IMPLANT
MANIFOLD NEPTUNE II (INSTRUMENTS) ×3 IMPLANT
NDL HYPO 21X1.5 SAFETY (NEEDLE) IMPLANT
NDL HYPO 25GX1X1/2 BEV (NEEDLE) ×2 IMPLANT
NEEDLE HYPO 21X1.5 SAFETY (NEEDLE) IMPLANT
NEEDLE HYPO 25GX1X1/2 BEV (NEEDLE) ×3 IMPLANT
NS IRRIG 1000ML POUR BTL (IV SOLUTION) ×3 IMPLANT
PACK TOTAL JOINT (CUSTOM PROCEDURE TRAY) ×3 IMPLANT
PAD ARMBOARD 7.5X6 YLW CONV (MISCELLANEOUS) ×6 IMPLANT
PAD CAST 4YDX4 CTTN HI CHSV (CAST SUPPLIES) IMPLANT
PADDING CAST COTTON 4X4 STRL (CAST SUPPLIES) ×1
PADDING CAST COTTON 6X4 STRL (CAST SUPPLIES) ×9 IMPLANT
PLATE FIB EVOS 81X2.7/3.5 5H (Plate) ×1 IMPLANT
SCREW CORT 2.7X15 T8 ST EVOS (Screw) ×1 IMPLANT
SCREW CORT 2.7X18 T8 ST EVOS (Screw) ×1 IMPLANT
SCREW CORT 2.7X19 ST STAR EVOS (Screw) ×2 IMPLANT
SCREW CORT 3.5X13MM (Screw) ×2 IMPLANT
SCREW CORT EVOS ST 3.5X12 (Screw) ×1 IMPLANT
SCREW CORT ST EVOS 3.5X55 (Screw) ×1 IMPLANT
SCREW CORT ST EVOS 3.5X60 (Screw) ×1 IMPLANT
SCREW EVOS 2.7X18 LOCK T8 (Screw) ×2 IMPLANT
SPLINT PLASTER CAST XFAST 5X30 (CAST SUPPLIES) IMPLANT
SPLINT PLASTER XFAST SET 5X30 (CAST SUPPLIES) ×1
SPONGE T-LAP 18X18 ~~LOC~~+RFID (SPONGE) ×3 IMPLANT
STAPLER VISISTAT 35W (STAPLE) ×3 IMPLANT
SUCTION FRAZIER HANDLE 10FR (MISCELLANEOUS) ×1
SUCTION TUBE FRAZIER 10FR DISP (MISCELLANEOUS) ×2 IMPLANT
SUT ETHILON 3 0 PS 1 (SUTURE) ×6 IMPLANT
SUT MNCRL AB 3-0 PS2 18 (SUTURE) ×3 IMPLANT
SUT MON AB 2-0 CT1 36 (SUTURE) ×3 IMPLANT
SUT PROLENE 0 CT (SUTURE) IMPLANT
SUT VIC AB 0 CT1 27 (SUTURE) ×1
SUT VIC AB 0 CT1 27XBRD ANBCTR (SUTURE) ×2 IMPLANT
SUT VIC AB 2-0 CT1 27 (SUTURE) ×2
SUT VIC AB 2-0 CT1 TAPERPNT 27 (SUTURE) ×4 IMPLANT
SYR CONTROL 10ML LL (SYRINGE) ×3 IMPLANT
TOWEL GREEN STERILE (TOWEL DISPOSABLE) ×6 IMPLANT
TOWEL GREEN STERILE FF (TOWEL DISPOSABLE) ×6 IMPLANT
UNDERPAD 30X36 HEAVY ABSORB (UNDERPADS AND DIAPERS) ×3 IMPLANT
WATER STERILE IRR 1000ML POUR (IV SOLUTION) ×6 IMPLANT

## 2021-08-06 NOTE — Op Note (Signed)
Orthopaedic Surgery Operative Note (CSN: [id number] ) Date of Surgery: 08/06/2021  Admit Date: 07/31/2021   Diagnoses: Pre-Op Diagnoses: Right trimalleolar ankle fracture/dislocation  Post-Op Diagnosis: Same  Procedures: CPT 16606-TKZS reduction internal fixation of right trimalleolar ankle fracture CPT 27829-Open reduction internal fixation of right syndesmosis CPT 20694-Removal of right ankle external fixation  Surgeons : Primary: Shona Needles, MD  Assistant: Patrecia Pace, PA-C  Location: OR 7   Anesthesia:General with regional   Antibiotics: Ancef 2g preopwith 1 gm vancoycin powder   Tourniquet time:None used   Estimated Blood WFUX:32 mL  Complications:None   Specimens:None   Implants: Implant Name Type Inv. Item Serial No. Manufacturer Lot No. LRB No. Used Action  PLATE FIB EVOS 35T7.3/2.2 5H - [id number] Plate PLATE FIB EVOS 37S2.8/3.1 5H  [employer]  Right 1 Implanted  SCREW CORT 3.5X13MM - [id number] Screw SCREW CORT 3.5X13MM  [employer]  Right 2 Implanted  SCREW CORT EVOS ST 3.5X12 - [id number] Screw SCREW CORT EVOS ST 3.5X12  [employer]  Right 1 Implanted  SCREW EVOS 2.7X18 LOCK T8 - [id number] Screw SCREW EVOS 2.7X18 LOCK T8  [employer]  Right 2 Implanted  SCREW CORT 2.7X19 ST STAR EVOS - [id number] Screw SCREW CORT 2.7X19 ST STAR EVOS  [employer]  Right 2 Implanted  SCREW CORT 2.7X18 T8 ST EVOS - [id number] Screw SCREW CORT 2.7X18 T8 ST EVOS  [employer]  Right 1 Implanted  SCREW CORT 2.7X15 T8 ST EVOS - [id number] Screw SCREW CORT 2.7X15 T8 ST EVOS  [employer]  Right 1 Implanted  SCREW CORT ST EVOS 3.5X60 - [id number] Screw SCREW CORT ST EVOS 3.5X60  [employer]  Right 1 Implanted  SCREW CORT ST EVOS 3.5X55 - [id number] Screw SCREW CORT ST EVOS 3.5X55  [employer]  Right 1 Implanted  KIT INVISIKNOT ANKLE  FRACTURE - [id number] Screw KIT INVISIKNOT ANKLE FRACTURE  [employer]  Right 1 Implanted     Indications for Surgery: 60 year old female who sustained a right trimalleolar ankle fracture.  She had significant swelling and dislocation so she underwent closed reduction and external fixation.  We allowed for soft tissue swelling to return to a more improved state and plan to proceed with open reduction internal fixation.  Risks and benefits were discussed with the patient.  Risks included but not limited to bleeding, infection, malunion, nonunion, hardware failure, hardware irritation, nerve or blood vessel injury, DVT, even possibility anesthetic complications.  The patient agreed to proceed with surgery and consent was obtained.  Operative Findings: 1.  Open reduction internal fixation of right trimalleolar ankle fracture using [employer]hew EVOS 3.5/2.7 mm distal fibular locking plate and 3.5 mm independent medial malleolus screws 2.  Medial clear space widening with external rotation stress view treated with fixation using Smith & Nephew Invisiknot suture fixation 3.  Removal of right ankle spanning external fixation.  Procedure: The patient was identified in the preoperative holding area. Consent was confirmed with the patient and their family and all questions were answered. The operative extremity was marked after confirmation with the patient. she was then brought back to the operating room by our anesthesia colleagues.  She was placed under general anesthetic and carefully transferred over to a radiolucent flat top table.  The right lower extremity had the external fixator was removed prior to prepping and draping the leg.  The right lower  extremity was then prepped and draped in usual sterile fashion.  A timeout was performed to verify the patient, the procedure, and the extremity.  Preoperative antibiotics were dosed.  Fluoroscopic imaging showed the unstable nature of her  injury.  A direct lateral approach to the distal fibula was carried down through skin and subcutaneous tissue.  I exposed the oblique fracture site of the distal fibula.  I cleaned out the hematoma and early callus and I proceeded to reduce the fracture with a reduction tenaculum.  I then proceeded to choose a Tamala Julian [employer] Nephew EVOS distal fibular locking plate 3.5/2.7 mm.  I aligned it appropriately and then drilled and placed a 3.5 millimeter screw into the fibular shaft.  Another fibular shaft screw was placed.  I then placed nonlocking and locking screws distally to provide fixation for the distal fibula.  I then percutaneously placed a 3.5 millimeter screw in the proximal hole of the plate.  I then made a curvilinear incision along the medial aspect of the ankle.  I carried it down through skin and subcutaneous tissue.  I carefully dissected out the neurovascular bundle.  I cleaned out the hematoma and used a reduction tenaculum to anatomically reduce the medial malleolus.  I then provisionally held it with a K wire.  I then drilled and placed 3.5 millimeter screws and gave bicortical fixation in the anterior and posterior aspect of the malleolus.  I then removed the tenaculum obtained fluoroscopic imaging and I performed an external rotation stress view.  There was some medial clear space widening.  I decided that suture fixation would be most appropriate.  Using the [facility] Invisiknot suture anchor I drilled through the fibula and tibia and brought it out through the medial cortex.  I then passed the suture device and flipped the button until it was flush to the bone.  I then tightened the suture fixation with the ankle in full dorsiflexion with medial force to the fibula and tied this down.  Final fluoroscopic imaging was obtained.  The incision was copiously irrigated.  A gram of vancomycin powder was placed into the incision.  A layered closure 2-0 Vicryl and 3-0 nylon was used to close the skin.   Sterile dressings were applied.  Patient was then awoken from anesthesia and taken to the PACU in stable condition.  Post Op Plan/Instructions: Patient will be nonweightbearing to the right lower extremity.  She will receive postoperative Ancef.  She will receive Lovenox for DVT prophylaxis.  I was present and performed the entire surgery.  Patrecia Pace, PA-C did assist me throughout the case. An assistant was necessary given the difficulty in approach, maintenance of reduction and ability to instrument the fracture.   Katha Hamming, MD [facility]

## 2021-08-06 NOTE — Anesthesia Procedure Notes (Signed)
Procedure Name: Intubation Date/Time: 08/06/2021 8:36 AM Performed by: Charyl Dancer, RN Pre-anesthesia Checklist: Patient identified, Emergency Drugs available, Suction available and Patient being monitored Patient Re-evaluated:Patient Re-evaluated prior to induction Oxygen Delivery Method: Circle System Utilized Preoxygenation: Pre-oxygenation with 100% oxygen Induction Type: IV induction Ventilation: Mask ventilation without difficulty and Oral airway inserted - appropriate to patient size Laryngoscope Size: Mac and 3 Grade View: Grade I Tube type: Oral Tube size: 7.5 mm Number of attempts: 1 Airway Equipment and Method: Stylet and Oral airway Placement Confirmation: ETT inserted through vocal cords under direct vision, positive ETCO2 and breath sounds checked- equal and bilateral Secured at: 21 (Teeth) cm Tube secured with: Tape Dental Injury: Teeth and Oropharynx as per pre-operative assessment

## 2021-08-06 NOTE — Anesthesia Procedure Notes (Signed)
Anesthesia Regional Block: Adductor canal block   Pre-Anesthetic Checklist: , timeout performed,  Correct Patient, Correct Site, Correct Laterality,  Correct Procedure, Correct Position, site marked,  Risks and benefits discussed,  Surgical consent,  Pre-op evaluation,  At surgeon's request and post-op pain management  Laterality: Right and Lower  Prep: chloraprep       Needles:  Injection technique: Single-shot  Needle Type: Echogenic Needle     Needle Length: 9cm  Needle Gauge: 21     Additional Needles:   Procedures:,,,, ultrasound used (permanent image in chart),,    Narrative:  Start time: 08/06/2021 8:16 AM End time: 08/06/2021 8:18 AM Injection made incrementally with aspirations every 5 mL.  Performed by: Personally  Anesthesiologist: Jairo Ben, MD  Additional Notes: Pt identified in Holding room.  Monitors applied. Working IV access confirmed. Sterile prep R thigh.  #21ga ECHOgenic Arrow block needle into adductor canal with US guidance.  10cc 0.5% Bupivacaine with 1:200k epi injected incrementally after negative test dose.  Patient asymptomatic, VSS, no heme aspirated, tolerated well.   Sandford Craze, MD

## 2021-08-06 NOTE — Progress Notes (Signed)
OT Cancellation Note  Patient Details Name: Angela Mahoney MRN: [id number] DOB: Jan 05, 1961   Cancelled Treatment:    Reason Eval/Treat Not Completed: Patient at procedure or test/ unavailable- pt off unit at procedure.  Will follow and see as able.   Barry Brunner, OT Acute Rehabilitation Services Pager [phone] Office [phone]   Chancy Milroy 08/06/2021, 10:42 AM

## 2021-08-06 NOTE — Interval H&P Note (Signed)
History and Physical Interval Note:  08/06/2021 8:24 AM  Angela Mahoney  has presented today for surgery, with the diagnosis of right trimalleolar ankle fracture.  The various methods of treatment have been discussed with the patient and family. After consideration of risks, benefits and other options for treatment, the patient has consented to  Procedure(s) with comments: REMOVAL EXTERNAL FIXATION LEG (Right) - zimmer biomet ex-fix removal OPEN REDUCTION INTERNAL FIXATION (ORIF) ANKLE FRACTURE (Right) - smith and nephew as a surgical intervention.  The patient's history has been reviewed, patient examined, no change in status, stable for surgery.  I have reviewed the patient's chart and labs.  Questions were answered to the patient's satisfaction.     Caryn Bee P Quanesha Klimaszewski

## 2021-08-06 NOTE — TOC Progression Note (Signed)
Transition of Care [facility]) - Initial/Assessment Note    Patient Details  Name: Angela Mahoney MRN: [id number] Date of Birth: Apr 05, 1961  [facility]) CM/SW Contact:    Ralene Bathe, LCSWA Phone Number: 08/06/2021, 12:26 PM  Clinical Narrative:                 CSW following patient for any d/c planning needs once medically stable.  Patient had surgery today.  Pending PT/OT recommendations after surgery.  Cleon Gustin, MSW, LCSWA     Expected Discharge Plan: Skilled Nursing Facility Barriers to Discharge: Continued Medical Work up   Patient Goals and CMS Choice   CMS Medicare.gov Compare Post Acute Care list provided to:: Patient Choice offered to / list presented to : Patient  Expected Discharge Plan and Services Expected Discharge Plan: Skilled Nursing Facility   Discharge Planning Services: CM Consult                                          Prior Living Arrangements/Services   Lives with:: Spouse Patient language and need for interpreter reviewed:: Yes Do you feel safe going back to the place where you live?: Yes      Need for Family Participation in Patient Care: Yes (Comment) Care giver support system in place?: Yes (comment) Current home services: DME (W/C, rolling walker) Criminal Activity/Legal Involvement Pertinent to Current Situation/Hospitalization: No - Comment as needed  Activities of Daily Living Home Assistive Devices/Equipment: Dentures (specify type), Walker (specify type), Blood pressure cuff ADL Screening (condition at time of admission) Patient's cognitive ability adequate to safely complete daily activities?: Yes Is the patient deaf or have difficulty hearing?: No Does the patient have difficulty seeing, even when wearing glasses/contacts?: No Does the patient have difficulty concentrating, remembering, or making decisions?: No Patient able to express need for assistance with ADLs?: Yes Does the patient have  difficulty dressing or bathing?: Yes Independently performs ADLs?: No Communication: Needs assistance Is this a change from baseline?: Pre-admission baseline Dressing (OT): Needs assistance Is this a change from baseline?: Pre-admission baseline Grooming: Independent Feeding: Independent Bathing: Needs assistance Is this a change from baseline?: Pre-admission baseline Toileting: Needs assistance Is this a change from baseline?: Pre-admission baseline In/Out Bed: Needs assistance Is this a change from baseline?: Pre-admission baseline Walks in Home: Needs assistance Is this a change from baseline?: Pre-admission baseline Does the patient have difficulty walking or climbing stairs?: Yes Weakness of Legs: Right Weakness of Arms/Hands: None  Permission Sought/Granted   Permission granted to share information with : Yes, Verbal Permission Granted  Share Information with NAME: Sianna Garofano (Spouse) [phone]           Emotional Assessment Appearance:: Appears stated age Attitude/Demeanor/Rapport: Gracious Affect (typically observed): Accepting Orientation: : Oriented to Self, Oriented to Place, Oriented to  Time, Oriented to Situation Alcohol / Substance Use: Not Applicable Psych Involvement: No (comment)  Admission diagnosis:  Closed displaced trimalleolar fracture of right ankle [S82.851A] Patient Active Problem List   Diagnosis Date Noted   Closed displaced trimalleolar fracture of right ankle 07/31/2021   PCP:  [facility] [place] [facility]:   [facility] DR[facility] STORE #[id number] Ginette Otto, [place] - [address] AT [facility] OF [facility] & [address] [place] [address] Phone: [phone] Fax: [phone]     Social Determinants of Health (SDOH) Interventions  Readmission Risk Interventions No flowsheet data found.   

## 2021-08-06 NOTE — Plan of Care (Signed)

## 2021-08-06 NOTE — Progress Notes (Signed)
7614 - Report was given to PACU Short stay and she was transported from the unit by staff at this time.

## 2021-08-06 NOTE — Anesthesia Procedure Notes (Signed)
Anesthesia Regional Block: Popliteal block   Pre-Anesthetic Checklist: , timeout performed,  Correct Patient, Correct Site, Correct Laterality,  Correct Procedure, Correct Position, site marked,  Risks and benefits discussed,  Surgical consent,  Pre-op evaluation,  At surgeon's request and post-op pain management  Laterality: Right and Lower  Prep: chloraprep       Needles:  Injection technique: Single-shot  Needle Type: Echogenic Needle     Needle Length: 9cm  Needle Gauge: 21     Additional Needles:   Procedures:,,,, ultrasound used (permanent image in chart),,    Narrative:  Start time: 08/06/2021 8:20 AM End time: 08/06/2021 8:27 AM Injection made incrementally with aspirations every 5 mL.  Performed by: Personally  Anesthesiologist: Jairo Ben, MD  Additional Notes: Pt identified in Holding room.  Monitors applied. Working IV access confirmed. Sterile prep R lateral knee.  #21ga ECHOgenic Arrow block needle to sciatic nerve at split in pop fossa with US guidance.  20cc 0.5% Bupivacaine with 1:200k epi injected incrementally after negative test dose.  Patient asymptomatic, VSS, no heme aspirated, tolerated well.   Sandford Craze, MD

## 2021-08-06 NOTE — Anesthesia Postprocedure Evaluation (Signed)
Anesthesia Post Note  Patient: Monya Kozakiewicz  Procedure(s) Performed: REMOVAL EXTERNAL FIXATION LEG (Right) OPEN REDUCTION INTERNAL FIXATION (ORIF) ANKLE FRACTURE (Right: Ankle)     Patient location during evaluation: PACU Anesthesia Type: General Level of consciousness: awake and alert, patient cooperative and oriented Pain management: pain level controlled Vital Signs Assessment: post-procedure vital signs reviewed and stable Respiratory status: spontaneous breathing, nonlabored ventilation, respiratory function stable and patient connected to nasal cannula oxygen Cardiovascular status: blood pressure returned to baseline and stable Postop Assessment: no apparent nausea or vomiting Anesthetic complications: no   No notable events documented.  Last Vitals:  Vitals:   08/06/21 1110 08/06/21 1147  BP: 125/76 121/75  Pulse:  73  Resp: 16 15  Temp: 36.6 C 36.4 C  SpO2: 98% 93%    Last Pain:  Vitals:   08/06/21 1414  TempSrc:   PainSc: 10-Worst pain ever                 Sharlet Notaro,E. Dionna Wiedemann

## 2021-08-06 NOTE — Transfer of Care (Signed)
Immediate Anesthesia Transfer of Care Note  Patient: Angela Mahoney  Procedure(s) Performed: REMOVAL EXTERNAL FIXATION LEG (Right) OPEN REDUCTION INTERNAL FIXATION (ORIF) ANKLE FRACTURE (Right: Ankle)  Patient Location: PACU  Anesthesia Type:General  Level of Consciousness: drowsy and patient cooperative  Airway & Oxygen Therapy: Patient Spontanous Breathing and Patient connected to nasal cannula oxygen  Post-op Assessment: Report given to RN and Post -op Vital signs reviewed and stable  Post vital signs: Reviewed and stable  Last Vitals:  Vitals Value Taken Time  BP    Temp    Pulse    Resp    SpO2      Last Pain:  Vitals:   08/06/21 0800  TempSrc:   PainSc: 10-Worst pain ever      Patients Stated Pain Goal: 2 (08/06/21 0800)  Complications: No notable events documented.

## 2021-08-07 ENCOUNTER — Encounter (HOSPITAL_COMMUNITY): Payer: Self-pay | Admitting: Student

## 2021-08-07 LAB — BASIC METABOLIC PANEL
Anion gap: 5 (ref 5–15)
BUN: 10 mg/dL (ref 8–23)
CO2: 28 mmol/L (ref 22–32)
Calcium: 8.7 mg/dL — ABNORMAL LOW (ref 8.9–10.3)
Chloride: 105 mmol/L (ref 98–111)
Creatinine, Ser: 0.75 mg/dL (ref 0.44–1.00)
GFR, Estimated: >60 mL/min (ref >60)
Glucose, Bld: 127 mg/dL — ABNORMAL HIGH (ref 70–99)
Potassium: 4.1 mmol/L (ref 3.5–5.1)
Sodium: 138 mmol/L (ref 135–145)

## 2021-08-07 LAB — CBC
HCT: 34.8 % — ABNORMAL LOW (ref 36.0–46.0)
Hemoglobin: 10.9 g/dL — ABNORMAL LOW (ref 12.0–15.0)
MCH: 28.8 pg (ref 26.0–34.0)
MCHC: 31.3 g/dL (ref 30.0–36.0)
MCV: 91.8 fL (ref 80.0–100.0)
Platelets: 266 10*3/uL (ref 150–400)
RBC: 3.79 MIL/uL — ABNORMAL LOW (ref 3.87–5.11)
RDW: 13.4 % (ref 11.5–15.5)
WBC: 6.8 10*3/uL (ref 4.0–10.5)
nRBC: 0 % (ref 0.0–0.2)

## 2021-08-07 MED ORDER — TRAZODONE HCL 50 MG PO TABS
50.0000 mg | ORAL_TABLET | Freq: Every day | ORAL | Status: DC
  Administered 2021-08-07: 50 mg via ORAL
  Filled 2021-08-07: qty 1

## 2021-08-07 NOTE — Progress Notes (Signed)
Orthopaedic Trauma Progress Note  SUBJECTIVE: Doing fairly well today. Pain controlled. Feels better after getting ex-fix off. Has not been ut of bed since surgery. Wants to get to bedside commode today. Having a hard time falling asleep, asking for something for this.   OBJECTIVE:  Vitals:   08/06/21 1953 08/07/21 0143  BP: (!) 90/57 96/63  Pulse: 88 75  Resp: 16   Temp: 98.4 F (36.9 C)   SpO2: 90% 100%    General: Sitting up in bed, NAD Respiratory: No increased work of breathing.  RLE: Short leg splint in place. Able to wiggle toes. Non-tender above splint. Foot warm and well perfused. Endorses sensation to light touch over the foot.  IMAGING: Stable post op imaging.   LABS:  Results for orders placed or performed during the hospital encounter of 07/31/21 (from the past 24 hour(s))  Basic metabolic panel     Status: Abnormal   Collection Time: 08/07/21  1:28 AM  Result Value Ref Range   Sodium 138 135 - 145 mmol/L   Potassium 4.1 3.5 - 5.1 mmol/L   Chloride 105 98 - 111 mmol/L   CO2 28 22 - 32 mmol/L   Glucose, Bld 127 (H) 70 - 99 mg/dL   BUN 10 8 - 23 mg/dL   Creatinine, Ser 7.06 0.44 - 1.00 mg/dL   Calcium 8.7 (L) 8.9 - 10.3 mg/dL   GFR, Estimated >23 >76 mL/min   Anion gap 5 5 - 15  CBC     Status: Abnormal   Collection Time: 08/07/21  1:28 AM  Result Value Ref Range   WBC 6.8 4.0 - 10.5 K/uL   RBC 3.79 (L) 3.87 - 5.11 MIL/uL   Hemoglobin 10.9 (L) 12.0 - 15.0 g/dL   HCT 28.3 (L) 15.1 - 76.1 %   MCV 91.8 80.0 - 100.0 fL   MCH 28.8 26.0 - 34.0 pg   MCHC 31.3 30.0 - 36.0 g/dL   RDW 60.7 37.1 - 06.2 %   Platelets 266 150 - 400 K/uL   nRBC 0.0 0.0 - 0.2 %     ASSESSMENT: Angela Mahoney is a 60 y.o. female, 1 Day Post-Op REMOVAL OF EXTERNAL FIXATOR AND ORIF RIGHT ANKLE  CV/Blood loss: Hgb 10.9 this AM. Hemodynamically stable  PLAN: Weightbearing: NWB RLE Incisional and dressing care: splint until follow-up Showering: keep splint dry when  showering Orthopedic device(s): Splint RLE Pain management: continue current regimen VTE prophylaxis: Lovenox, SCDs ID:  Ancef 2gm post op  Foley/Lines:  No foley, KVO IVFs Impediments to Fracture Healing: Vit D level 46, no supplementation needed Dispo: Therapies as tolerated, PT/OT recommending SNF. TOC following.   Follow - up plan: 2 weeks after d/c for repeat x-rays and wound check  Contact information:  Truitt Merle MD, Ulyses Southward PA-C. After hours and holidays please check [email or web address] for group call information for [facility]   Baya Lentz A. Michaelyn Barter, PA-C [phone] (office) [email or web address]

## 2021-08-07 NOTE — TOC Initial Note (Addendum)
Transition of Care [facility]) - Initial/Assessment Note    Patient Details  Name: Angela Mahoney MRN: [id number] Date of Birth: 12-17-1960  [facility]) CM/SW Contact:    Kingsley Plan, RN Phone Number: 08/07/2021, 12:37 PM  Clinical Narrative:                 Patient from home with husband . Patient declining SNF at discharge and wants to go home with home health PT. Husband in agreement.   No preference in home health agency.   Patient recently received wheel chair, need drop arm bedside commode and walker. Ordered drop arm bedside commode and walker with Velna Hatchet with [facility]. [facility] is not in network with insurance. DME ordered with Jermain with [employer]   HHPT to see regarding shower chair due to size of tub.   Cory with [facility] cannot accept due to insurance.   Pearson Grippe with [facility] cannot take due to insurance.   Amy with [facility] is not in network with insurance.   Left Marylene Land with [facility] a message. Marylene Land returned call and will check to see if they are in network with insurance . Marylene Land returned call they are not in network.   Marchelle Folks with [facility] will review insurance/ referral. Called back to let Marchelle Folks know [facility] accepted   Stacie with [facility] is not in network with insurance.   Left Elnita Maxwell with [facility] a message. Elnita Maxwell returned call they are not in network with patient's insurance.   Left [place] with [facility] message, Lowanda Foster called back they are not in network.   Left Sue Lush with [facility] a voicemail awaiting call back.   Spoke to [place] at [facility] , they are not in network with [employer].   Spoke to [place] at [facility] , they can accept patient for HHPT ( their HHPT will accomodate OT). They will need an order for HHPT, start of care beginning of next week   Expected Discharge Plan: Home w Home Health Services Barriers to Discharge: Continued Medical Work up   Patient  Goals and CMS Choice Patient states their goals for this hospitalization and ongoing recovery are:: to go home CMS Medicare.gov Compare Post Acute Care list provided to:: Patient Choice offered to / list presented to : Patient, Spouse  Expected Discharge Plan and Services Expected Discharge Plan: Home w Home Health Services   Discharge Planning Services: CM Consult Post Acute Care Choice: Home Health, Durable Medical Equipment Living arrangements for the past 2 months: Apartment                 DME Arranged: 3-N-1, Walker rolling (walker and drop arm commode) DME Agency: [facility] Date DME Agency Contacted: 08/07/21 Time DME Agency Contacted: 1236 Representative spoke with at DME Agency: Velna Hatchet [facility] Arranged: PT          Prior Living Arrangements/Services Living arrangements for the past 2 months: Apartment Lives with:: Spouse Patient language and need for interpreter reviewed:: Yes Do you feel safe going back to the place where you live?: Yes      Need for Family Participation in Patient Care: Yes (Comment) Care giver support system in place?: Yes (comment) Current home services: DME Criminal Activity/Legal Involvement Pertinent to Current Situation/Hospitalization: No - Comment as needed  Activities of Daily Living Home Assistive Devices/Equipment: Dentures (specify type), Walker (specify type), Blood pressure cuff ADL Screening (condition at time of admission) Patient's cognitive ability adequate to safely complete daily activities?:  Yes Is the patient deaf or have difficulty hearing?: No Does the patient have difficulty seeing, even when wearing glasses/contacts?: No Does the patient have difficulty concentrating, remembering, or making decisions?: No Patient able to express need for assistance with ADLs?: Yes Does the patient have difficulty dressing or bathing?: Yes Independently performs ADLs?: No Communication: Needs assistance Is this a change from baseline?:  Pre-admission baseline Dressing (OT): Needs assistance Is this a change from baseline?: Pre-admission baseline Grooming: Independent Feeding: Independent Bathing: Needs assistance Is this a change from baseline?: Pre-admission baseline Toileting: Needs assistance Is this a change from baseline?: Pre-admission baseline In/Out Bed: Needs assistance Is this a change from baseline?: Pre-admission baseline Walks in Home: Needs assistance Is this a change from baseline?: Pre-admission baseline Does the patient have difficulty walking or climbing stairs?: Yes Weakness of Legs: Right Weakness of Arms/Hands: None  Permission Sought/Granted   Permission granted to share information with : Yes, Verbal Permission Granted  Share Information with NAME: Husband Dorene Sorrow at bedside           Emotional Assessment Appearance:: Appears stated age Attitude/Demeanor/Rapport: Engaged Affect (typically observed): Accepting Orientation: : Oriented to Self, Oriented to Place, Oriented to  Time, Oriented to Situation Alcohol / Substance Use: Not Applicable Psych Involvement: No (comment)  Admission diagnosis:  Closed displaced trimalleolar fracture of right ankle [S82.851A] Patient Active Problem List   Diagnosis Date Noted   Closed displaced trimalleolar fracture of right ankle 07/31/2021   PCP:  [facility] [place] [facility]:   [facility]ORE #[id number] Ginette Otto, [place] - [address] AT [facility] OF [facility] & [address] [place] [address] Phone: [phone] Fax: [phone]     Social Determinants of Health (SDOH) Interventions    Readmission Risk Interventions No flowsheet data found.

## 2021-08-07 NOTE — TOC Progression Note (Signed)
Transition of Care [facility]) - Initial/Assessment Note    Patient Details  Name: Angela Mahoney MRN: [id number] Date of Birth: 02-Feb-1961  Transition of Care [facility]) CM/SW Contact:    Ralene Bathe, LCSWA Phone Number: 08/07/2021, 12:11 PM  Clinical Narrative:                 CSW spoke with the patient about SNF recommendation.  The patient stated that she did not want to go to a facility for more than 3-5 days and would rather go home with home health.    CSW notified the PA and PT.  PT reported that the patient has progressed well with rehab and could possibly be safe to go home with home health.  CM covering the floor was notified.    Expected Discharge Plan: Skilled Nursing Facility Barriers to Discharge: Continued Medical Work up   Patient Goals and CMS Choice   CMS Medicare.gov Compare Post Acute Care list provided to:: Patient Choice offered to / list presented to : Patient  Expected Discharge Plan and Services Expected Discharge Plan: Skilled Nursing Facility   Discharge Planning Services: CM Consult                                          Prior Living Arrangements/Services   Lives with:: Spouse Patient language and need for interpreter reviewed:: Yes Do you feel safe going back to the place where you live?: Yes      Need for Family Participation in Patient Care: Yes (Comment) Care giver support system in place?: Yes (comment) Current home services: DME (W/C, rolling walker) Criminal Activity/Legal Involvement Pertinent to Current Situation/Hospitalization: No - Comment as needed  Activities of Daily Living Home Assistive Devices/Equipment: Dentures (specify type), Walker (specify type), Blood pressure cuff ADL Screening (condition at time of admission) Patient's cognitive ability adequate to safely complete daily activities?: Yes Is the patient deaf or have difficulty hearing?: No Does the patient have difficulty seeing, even when wearing  glasses/contacts?: No Does the patient have difficulty concentrating, remembering, or making decisions?: No Patient able to express need for assistance with ADLs?: Yes Does the patient have difficulty dressing or bathing?: Yes Independently performs ADLs?: No Communication: Needs assistance Is this a change from baseline?: Pre-admission baseline Dressing (OT): Needs assistance Is this a change from baseline?: Pre-admission baseline Grooming: Independent Feeding: Independent Bathing: Needs assistance Is this a change from baseline?: Pre-admission baseline Toileting: Needs assistance Is this a change from baseline?: Pre-admission baseline In/Out Bed: Needs assistance Is this a change from baseline?: Pre-admission baseline Walks in Home: Needs assistance Is this a change from baseline?: Pre-admission baseline Does the patient have difficulty walking or climbing stairs?: Yes Weakness of Legs: Right Weakness of Arms/Hands: None  Permission Sought/Granted   Permission granted to share information with : Yes, Verbal Permission Granted  Share Information with NAME: Millenia Waldvogel (Spouse) [phone]           Emotional Assessment Appearance:: Appears stated age Attitude/Demeanor/Rapport: Gracious Affect (typically observed): Accepting Orientation: : Oriented to Self, Oriented to Place, Oriented to  Time, Oriented to Situation Alcohol / Substance Use: Not Applicable Psych Involvement: No (comment)  Admission diagnosis:  Closed displaced trimalleolar fracture of right ankle [S82.851A] Patient Active Problem List   Diagnosis Date Noted   Closed displaced trimalleolar fracture of right ankle 07/31/2021   PCP:  [facility] [place] [facility]:  [facility] DR[facility] STORE #[id number] Ginette Otto, [place] - [address] AT [facility] OF [facility] & [address] [place] [address] Phone: [phone] Fax: [phone]     Social Determinants of Health (SDOH)  Interventions    Readmission Risk Interventions No flowsheet data found.

## 2021-08-07 NOTE — Progress Notes (Signed)
Physical Therapy Treatment Patient Details Name: Angela Mahoney MRN: [id number] DOB: 27-Apr-1961 Today's Date: 08/07/2021   History of Present Illness Pt is a 60 y/o female admitted 9/15 due to R trimalleolar ankle fracture dislocation, syndesmotic disruption s/p exfix application 9/15.  Hx of ground level fall 9/9 with R ankle fx. S/P exfix removal and ORIF R ankle 9/21. PMH includes: COPD, CAD, fibromyalgia.    PT Comments    Continuing work on functional mobility and activity tolerance;  Showing excellent progress with using lateral scooting for OOB transfers and tranfers to wheelchair; Reviewed WC parts and managemetn, as well as WC mobility; Overall managing quite well at wheelchair level; updated dc recs to home and notified TOC Team, RN, OT, and PA for dc rec changes   Recommendations for follow up therapy are one component of a multi-disciplinary discharge planning process, led by the attending physician.  Recommendations may be updated based on patient status, additional functional criteria and insurance authorization.  Follow Up Recommendations  Home health PT;Supervision for mobility/OOB     Equipment Recommendations  Rolling walker with 5" wheels (drop arm BSC)    Recommendations for Other Services       Precautions / Restrictions Precautions Precautions: Fall Precaution Comments: soft BPs Restrictions RLE Weight Bearing: Non weight bearing     Mobility  Bed Mobility                    Transfers Overall transfer level: Needs assistance   Transfers: Lateral/Scoot Transfers          Lateral/Scoot Transfers: Min guard (with and without physical contact) General transfer comment: performed lateral scoot recliner to WC; then WC to bench in hallway, then back to wheelchair; able to transfer towards R side and L side via lateral scooting; noting much smoother, more efficient, and less energetiaclly taxing scoote  Ambulation/Gait                  Dance movement psychotherapist Wheelchair mobility: Yes Wheelchair propulsion: Both upper extremities;Left lower extremity Wheelchair parts: Supervision/cueing Distance: 400 Wheelchair Assistance Details (indicate cue type and reason): Cues for more efficient propulsion; good manageemtn in the tight space of her room; Worked with her own wheelchair  Modified Rankin (Stroke Patients Only)       Balance                                            Cognition Arousal/Alertness: Awake/alert Behavior During Therapy: WFL for tasks assessed/performed Overall Cognitive Status: Within Functional Limits for tasks assessed                                        Exercises      General Comments General comments (skin integrity, edema, etc.): Took extra time to consider updating plan to dc home; pt described correntc car transfers as well      Pertinent Vitals/Pain Pain Assessment: Faces Faces Pain Scale: Hurts little more Pain Location: R ankle Pain Descriptors / Indicators: Guarding;Grimacing    Home Living                      Prior Function  PT Goals (current goals can now be found in the care plan section) Acute Rehab PT Goals Patient Stated Goal: to get better, to be able to fully move into new apt PT Goal Formulation: With patient Time For Goal Achievement: 08/15/21 Potential to Achieve Goals: Good Progress towards PT goals: Progressing toward goals    Frequency    Min 5X/week      PT Plan Discharge plan needs to be updated;Frequency needs to be updated    Co-evaluation              AM-PAC PT "6 Clicks" Mobility   Outcome Measure  Help needed turning from your back to your side while in a flat bed without using bedrails?: A Little Help needed moving from lying on your back to sitting on the side of a flat bed without using bedrails?: A Little Help needed moving  to and from a bed to a chair (including a wheelchair)?: A Little Help needed standing up from a chair using your arms (e.g., wheelchair or bedside chair)?: A Lot Help needed to walk in hospital room?: A Lot Help needed climbing 3-5 steps with a railing? : Total 6 Click Score: 14    End of Session   Activity Tolerance: Patient tolerated treatment well Patient left: with call bell/phone within reach (managing well in her room in her wheelchair) Nurse Communication: Mobility status PT Visit Diagnosis: Unsteadiness on feet (R26.81);Repeated falls (R29.6);Muscle weakness (generalized) (M62.81);Pain Pain - Right/Left: Right Pain - part of body: Ankle and joints of foot     Time: 1116-1200 PT Time Calculation (min) (ACUTE ONLY): 44 min  Charges:  $Therapeutic Activity: 8-22 mins $Self Care/Home Management: 8-22 $Wheel Chair Management: 8-22 mins                     Angela Mahoney, Angela Mahoney  Acute Rehabilitation Services Pager [phone] Office [phone]    Angela Mahoney 08/07/2021, 3:01 PM

## 2021-08-07 NOTE — Plan of Care (Signed)

## 2021-08-07 NOTE — Progress Notes (Signed)
Occupational Therapy Treatment Patient Details Name: Angela Mahoney MRN: [id number] DOB: [birth year] Today's Date: 08/07/2021   History of present illness Pt is a 60 y/o female admitted 9/15 due to R trimalleolar ankle fracture dislocation, syndesmotic disruption s/p exfix application 9/15.  Hx of ground level fall 9/9 with R ankle fx. S/P exfix removal and ORIF R ankle 9/21. PMH includes: COPD, CAD, fibromyalgia.   OT comments  Pt supine in bed and eager for OT session.  Completed transfer to [facility] with min assist, lateral scoot technique with cueing for hand placement, sequencing and safety.  Mod assist for LB ADLs. She remains limited by R LE NWB, pain, and generalized weakness. Continue to recommend SNF at this time to increase strength, independence with transfers and ADLs. Will follow acutely.    Recommendations for follow up therapy are one component of a multi-disciplinary discharge planning process, led by the attending physician.  Recommendations may be updated based on patient status, additional functional criteria and insurance authorization.    Follow Up Recommendations  SNF;Supervision/Assistance - 24 hour    Equipment Recommendations  3 in 1 bedside commode (drop arm)    Recommendations for Other Services      Precautions / Restrictions Precautions Precautions: Fall Precaution Comments: soft BPs Restrictions Weight Bearing Restrictions: Yes RLE Weight Bearing: Non weight bearing       Mobility Bed Mobility Overal bed mobility: Needs Assistance Bed Mobility: Supine to Sit     Supine to sit: Supervision     General bed mobility comments: for safety    Transfers Overall transfer level: Needs assistance   Transfers: Lateral/Scoot Transfers          Lateral/Scoot Transfers: Min assist General transfer comment: min assist lateral scoot from EOB to DABSC, DABSC to recliner transferring towards L side with cueing for hand placement, sequencing and  safety adhering to NWB R LE    Balance Overall balance assessment: Needs assistance Sitting-balance support: No upper extremity supported;Feet supported Sitting balance-Leahy Scale: Good                                     ADL either performed or assessed with clinical judgement   ADL Overall ADL's : Needs assistance/impaired                     Lower Body Dressing: Moderate assistance;Sitting/lateral leans Lower Body Dressing Details (indicate cue type and reason): min assist to don L sock, requires assist for clothing mgmt on R side; lateral leans with min assist Toilet Transfer: Minimal assistance Toilet Transfer Details (indicate cue type and reason): lateral scoot to DABSC         Functional mobility during ADLs: Minimal assistance;Cueing for sequencing;Cueing for safety General ADL Comments: remains limited by pain, NWB R LE and weakness     Vision       Perception     Praxis      Cognition Arousal/Alertness: Awake/alert Behavior During Therapy: WFL for tasks assessed/performed Overall Cognitive Status: Within Functional Limits for tasks assessed                                          Exercises     Shoulder Instructions       General Comments      Pertinent  Vitals/ Pain       Pain Assessment: Faces Faces Pain Scale: Hurts little more Pain Location: R ankle Pain Descriptors / Indicators: Guarding;Grimacing Pain Intervention(s): Limited activity within patient's tolerance;Monitored during session;Repositioned  Home Living                                          Prior Functioning/Environment              Frequency  Min 2X/week        Progress Toward Goals  OT Goals(current goals can now be found in the care plan section)  Progress towards OT goals: Progressing toward goals  Acute Rehab OT Goals Patient Stated Goal: to get better, to be able to fully move into new apt OT Goal  Formulation: With patient  Plan Discharge plan remains appropriate;Frequency remains appropriate    Co-evaluation                 AM-PAC OT "6 Clicks" Daily Activity     Outcome Measure   Help from another person eating meals?: None Help from another person taking care of personal grooming?: A Little Help from another person toileting, which includes using toliet, bedpan, or urinal?: A Lot Help from another person bathing (including washing, rinsing, drying)?: A Lot Help from another person to put on and taking off regular upper body clothing?: A Little Help from another person to put on and taking off regular lower body clothing?: A Lot 6 Click Score: 16    End of Session Equipment Utilized During Treatment: Gait belt  OT Visit Diagnosis: Other abnormalities of gait and mobility (R26.89);Muscle weakness (generalized) (M62.81);History of falling (Z91.81);Pain Pain - Right/Left: Right Pain - part of body: Ankle and joints of foot   Activity Tolerance Patient tolerated treatment well   Patient Left in chair;with call bell/phone within reach   Nurse Communication Mobility status;Weight bearing status;Precautions        Time: 8280-0349 OT Time Calculation (min): 31 min  Charges: OT General Charges $OT Visit: 1 Visit OT Treatments $Self Care/Home Management : 23-37 mins  Barry Brunner, OT Acute Rehabilitation Services Pager [phone] Office [phone]   Angela Mahoney 08/07/2021, 9:53 AM

## 2021-08-08 LAB — BASIC METABOLIC PANEL
Anion gap: 6 (ref 5–15)
BUN: 11 mg/dL (ref 8–23)
CO2: 27 mmol/L (ref 22–32)
Calcium: 8.6 mg/dL — ABNORMAL LOW (ref 8.9–10.3)
Chloride: 107 mmol/L (ref 98–111)
Creatinine, Ser: 0.68 mg/dL (ref 0.44–1.00)
GFR, Estimated: >60 mL/min (ref >60)
Glucose, Bld: 81 mg/dL (ref 70–99)
Potassium: 3.5 mmol/L (ref 3.5–5.1)
Sodium: 140 mmol/L (ref 135–145)

## 2021-08-08 LAB — CBC
HCT: 34.6 % — ABNORMAL LOW (ref 36.0–46.0)
Hemoglobin: 11 g/dL — ABNORMAL LOW (ref 12.0–15.0)
MCH: 29.6 pg (ref 26.0–34.0)
MCHC: 31.8 g/dL (ref 30.0–36.0)
MCV: 93 fL (ref 80.0–100.0)
Platelets: 246 10*3/uL (ref 150–400)
RBC: 3.72 MIL/uL — ABNORMAL LOW (ref 3.87–5.11)
RDW: 13.9 % (ref 11.5–15.5)
WBC: 6.7 10*3/uL (ref 4.0–10.5)
nRBC: 0 % (ref 0.0–0.2)

## 2021-08-08 MED ORDER — ASPIRIN EC 325 MG PO TBEC: 325.0000 mg | DELAYED_RELEASE_TABLET | Freq: Two times a day (BID) | ORAL | 0 refills | Status: AC

## 2021-08-08 MED ORDER — HYDROCODONE-ACETAMINOPHEN 7.5-325 MG PO TABS: 1.0000 | ORAL_TABLET | ORAL | 0 refills | Status: DC | PRN

## 2021-08-08 MED ORDER — DOCUSATE SODIUM 100 MG PO CAPS: 100.0000 mg | ORAL_CAPSULE | Freq: Two times a day (BID) | ORAL | 0 refills | Status: DC

## 2021-08-08 MED ORDER — CELECOXIB 200 MG PO CAPS: 200.0000 mg | ORAL_CAPSULE | Freq: Two times a day (BID) | ORAL | 0 refills | Status: AC

## 2021-08-08 MED ORDER — DULOXETINE HCL 60 MG PO CPEP
120.0000 mg | ORAL_CAPSULE | Freq: Every day | ORAL | 3 refills | Status: DC
Start: 2021-08-08 — End: 2024-05-30

## 2021-08-08 NOTE — Discharge Instructions (Signed)
Truitt Merle, MD [place] PA-C [facility] [address] [phone] Jani Files)   [phone] (fax)                                  POST-OPERATIVE INSTRUCTIONS     WEIGHT BEARING STATUS: non-weightbearing right lower extremity  RANGE OF MOTION/ACTIVITY:  Ok for knee motion as tolerated. Do not remove splint  WOUND CARE Please keep splint clean dry and intact until follow-up. If your splint gets wet for any reason please contact the office immediately.  Do not stick anything down your splint such as pencils, momey, hangers to try and scratch yourself.  If you feel itchy take Benadryl as prescribed on the bottle for itching You may shower on Post-Op Day #2.  You must keep splint dry during this process and may find that a plastic bag taped around the extremity or alternatively a towel based bath may be a better option.   If you get your splint wet or if it is damaged please contact our clinic.  EXERCISES Due to your splint being in place you will not be able to bear weight through your extremity.   DO NOT PUT ANY WEIGHT ON YOUR OPERATIVE LEG Please use crutches or a walker to avoid weight bearing.   DVT/PE prophylaxis: Lovenox  DIET: As you were eating previously.  Can use over the counter stool softeners and bowel preparations, such as Miralax, to help with bowel movements.  Narcotics can be constipating.  Be sure to drink plenty of fluids  REGIONAL ANESTHESIA (NERVE BLOCKS) The anesthesia team may have performed a nerve block for you if safe in the setting of your care.  This is a great tool used to minimize pain.  Typically the block may start wearing off overnight but the long acting medicine may last for 3-4 days.  The nerve block wearing off can be a challenging period but please utilize your as needed pain medications to try and manage this period.    POST-OP MEDICATIONS- Multimodal approach to pain control  In general your pain will be  controlled with a combination of substances.  Prescriptions unless otherwise discussed are electronically sent to your pharmacy.  This is a carefully made plan we use to minimize narcotic use.     - Meloxicam OR Celebrex - Anti-inflammatory medication taken on a scheduled basis  - Acetaminophen - Non-narcotic pain medicine taken on a scheduled basis   - Oxycodone - This is a strong narcotic, to be used only on an "as needed" basis for pain.  -  Lovenox - This medicine is used to minimize the risk of blood clots after surgery.             -          Zofran - take as needed for nausea   FOLLOW-UP If you develop a Fever (>101.5), Redness or Drainage from the surgical incision site, please call our office to arrange for an evaluation. Please call the office to schedule a follow-up appointment for your incision check if you do not already have one, 7-10 days post-operatively.   VISIT OUR WEBSITE FOR ADDITIONAL INFORMATION: [email or web address]   HELPFUL INFORMATION  If you had a block, it will wear off between 8-24 hrs postop typically.  This is period when your pain may go from nearly zero to the pain you would have had postop without the block.  This is an abrupt transition but nothing dangerous is happening.  You may take an extra dose of narcotic when this happens.  You should wean off your narcotic medicines as soon as you are able.  Most patients will be off or using minimal narcotics before their first postop appointment.   We suggest you use the pain medication the first night prior to going to bed, in order to ease any pain when the anesthesia wears off. You should avoid taking pain medications on an empty stomach as it will make you nauseous.  Do not drink alcoholic beverages or take illicit drugs when taking pain medications.  In most states it is against the law to drive while you are in a splint or sling.  And certainly against the law to drive while taking narcotics.  You may return  to work/school in the next couple of days when you feel up to it.   Pain medication may make you constipated.  Below are a few solutions to try in this order: Decrease the amount of pain medication if you aren't having pain. Drink lots of decaffeinated fluids. Drink prune juice and/or each dried prunes  If the first 3 don't work start with additional solutions Take Colace - an over-the-counter stool softener Take Senokot - an over-the-counter laxative Take Miralax - a stronger over-the-counter laxative

## 2021-08-08 NOTE — Progress Notes (Signed)
Physical Therapy Treatment Patient Details Name: Angela Mahoney MRN: [id number] DOB: December 11, 1960 Today's Date: 08/08/2021   History of Present Illness Pt is a 60 y/o female admitted 9/15 due to R trimalleolar ankle fracture dislocation, syndesmotic disruption s/p exfix application 9/15.  Hx of ground level fall 9/9 with R ankle fx. S/P exfix removal and ORIF R ankle 9/21. PMH includes: COPD, CAD, fibromyalgia.    PT Comments    Continuing work on functional mobility and activity tolerance;  Session focused on stand pivot transfers which pt performed well; Questions solicited and answered; OK for dc home from PT standpoint    Recommendations for follow up therapy are one component of a multi-disciplinary discharge planning process, led by the attending physician.  Recommendations may be updated based on patient status, additional functional criteria and insurance authorization.  Follow Up Recommendations  Home health PT;Supervision for mobility/OOB     Equipment Recommendations  Rolling walker with 5" wheels (drop arm BSC)    Recommendations for Other Services       Precautions / Restrictions Precautions Precautions: Fall Precaution Comments: fall risk present, but low Restrictions RLE Weight Bearing: Non weight bearing     Mobility  Bed Mobility Overal bed mobility: Modified Independent                  Transfers Overall transfer level: Needs assistance Equipment used: Rolling walker (2 wheeled) Transfers: Stand Pivot Transfers;Lateral/Scoot Transfers   Stand pivot transfers: Min guard      Lateral/Scoot Transfers: Supervision General transfer comment: Minguard for stand pivot transfer wc to elevated bed to simulate car transfer; stood and pivoted to teh L well; still with solid lateral scooting  Ambulation/Gait                 Stairs             Wheelchair Mobility    Modified Rankin (Stroke Patients Only)       Balance      Sitting balance-Leahy Scale: Good                                      Cognition Arousal/Alertness: Awake/alert Behavior During Therapy: WFL for tasks assessed/performed Overall Cognitive Status: Within Functional Limits for tasks assessed                                        Exercises      General Comments        Pertinent Vitals/Pain Pain Assessment: Faces Faces Pain Scale: Hurts a little bit Pain Location: R ankle Pain Descriptors / Indicators: Guarding;Grimacing Pain Intervention(s): Monitored during session    Home Living                      Prior Function            PT Goals (current goals can now be found in the care plan section) Acute Rehab PT Goals Patient Stated Goal: to get better, to be able to fully move into new apt PT Goal Formulation: With patient Time For Goal Achievement: 08/15/21 Potential to Achieve Goals: Good Progress towards PT goals: Progressing toward goals    Frequency    Min 5X/week      PT Plan Current plan remains appropriate    Co-evaluation  AM-PAC PT "6 Clicks" Mobility   Outcome Measure  Help needed turning from your back to your side while in a flat bed without using bedrails?: None Help needed moving from lying on your back to sitting on the side of a flat bed without using bedrails?: None Help needed moving to and from a bed to a chair (including a wheelchair)?: None Help needed standing up from a chair using your arms (e.g., wheelchair or bedside chair)?: A Little Help needed to walk in hospital room?: Total Help needed climbing 3-5 steps with a railing? : Total 6 Click Score: 17    End of Session Equipment Utilized During Treatment: Gait belt Activity Tolerance: Patient tolerated treatment well Patient left: in bed;with call bell/phone within reach;with family/visitor present Nurse Communication: Mobility status PT Visit Diagnosis: Unsteadiness on  feet (R26.81);Repeated falls (R29.6);Muscle weakness (generalized) (M62.81);Pain Pain - Right/Left: Right Pain - part of body: Ankle and joints of foot     Time: 5465-0354 PT Time Calculation (min) (ACUTE ONLY): 14 min  Charges:  $Therapeutic Activity: 8-22 mins                     Van Clines, PT  Acute Rehabilitation Services Pager [phone] Office [phone]    Levi Aland 08/08/2021, 2:47 PM

## 2021-08-08 NOTE — Progress Notes (Signed)
Occupational Therapy Treatment Patient Details Name: Angela Mahoney MRN: [id number] DOB: 17-Apr-1961 Today's Date: 08/08/2021   History of present illness Pt is a 60 y/o female admitted 9/15 due to R trimalleolar ankle fracture dislocation, syndesmotic disruption s/p exfix application 9/15.  Hx of ground level fall 9/9 with R ankle fx. S/P exfix removal and ORIF R ankle 9/21. PMH includes: COPD, CAD, fibromyalgia.   OT comments  Patient supine in bed and agreeable to OT session, eager for DC home today.  Reviewed transfers to/from [facility] and WC, setup and technique- completing with supervision.  Pt fearful with transferring towards R side but able to complete with cueing.  Pt very aware of safety and able to maintain NWB status to RLE throughout session.  Simulated tub transfers using 3:1 drop arm commode, simulated lateral lean dressing/bathing at EOB- educated on using wider surface for ease of lateral leans and pt agreeable.  Pt with no further questions or concerns at this time, OT will follow acutely and updated dc plan to [facility] services.    Recommendations for follow up therapy are one component of a multi-disciplinary discharge planning process, led by the attending physician.  Recommendations may be updated based on patient status, additional functional criteria and insurance authorization.    Follow Up Recommendations  Home health OT;Supervision - Intermittent    Equipment Recommendations  3 in 1 bedside commode (drop arm commode)    Recommendations for Other Services      Precautions / Restrictions Precautions Precautions: Fall Precaution Comments: soft BPs Restrictions Weight Bearing Restrictions: Yes RLE Weight Bearing: Non weight bearing       Mobility Bed Mobility Overal bed mobility: Modified Independent                  Transfers Overall transfer level: Needs assistance   Transfers: Lateral/Scoot Transfers;Squat Pivot Transfers     Squat pivot  transfers: Supervision    Lateral/Scoot Transfers: Supervision General transfer comment: supervision for safety with lateral scoots and squat pivot transfers to L and R side from EOB <> DABSC, EOB > wc and simulated tub transfers wc <>DABSC    Balance Overall balance assessment: Needs assistance Sitting-balance support: No upper extremity supported;Feet supported Sitting balance-Leahy Scale: Good                                     ADL either performed or assessed with clinical judgement   ADL               Lower Body Bathing: Minimal assistance;Sitting/lateral leans Lower Body Bathing Details (indicate cue type and reason): a little assist for buttocks, requires cueing for technique in lateral leans and educated on completing on 3:1 drop arm commde at home with assist Upper Body Dressing : Set up;Sitting   Lower Body Dressing: Minimal assistance;Sitting/lateral leans Lower Body Dressing Details (indicate cue type and reason): lateral lean techniques on EOB simulated Toilet Transfer: Supervision/safety;Squat-pivot Toilet Transfer Details (indicate cue type and reason): to drop arm BSC Toileting- Clothing Manipulation and Hygiene: Supervision/safety;Sitting/lateral lean       Functional mobility during ADLs: Supervision/safety       Vision       Perception     Praxis      Cognition Arousal/Alertness: Awake/alert Behavior During Therapy: WFL for tasks assessed/performed Overall Cognitive Status: Within Functional Limits for tasks assessed  Exercises     Shoulder Instructions       General Comments increased time and agreeable to assist for all transfers for safety, pt plans to basin bathe initally    Pertinent Vitals/ Pain       Pain Assessment: Faces Faces Pain Scale: Hurts a little bit Pain Location: R ankle Pain Descriptors / Indicators: Guarding;Grimacing Pain Intervention(s):  Limited activity within patient's tolerance;Monitored during session;Repositioned  Home Living                                          Prior Functioning/Environment              Frequency  Min 2X/week        Progress Toward Goals  OT Goals(current goals can now be found in the care plan section)  Progress towards OT goals: Progressing toward goals  Acute Rehab OT Goals Patient Stated Goal: to get better, to be able to fully move into new apt OT Goal Formulation: With patient  Plan Discharge plan needs to be updated;Frequency remains appropriate    Co-evaluation                 AM-PAC OT "6 Clicks" Daily Activity     Outcome Measure   Help from another person eating meals?: None Help from another person taking care of personal grooming?: A Little Help from another person toileting, which includes using toliet, bedpan, or urinal?: A Little Help from another person bathing (including washing, rinsing, drying)?: A Little Help from another person to put on and taking off regular upper body clothing?: A Little Help from another person to put on and taking off regular lower body clothing?: A Little 6 Click Score: 19    End of Session Equipment Utilized During Treatment: Gait belt  OT Visit Diagnosis: Other abnormalities of gait and mobility (R26.89);Muscle weakness (generalized) (M62.81);History of falling (Z91.81);Pain Pain - Right/Left: Right Pain - part of body: Ankle and joints of foot   Activity Tolerance Patient tolerated treatment well   Patient Left in chair;with call bell/phone within reach   Nurse Communication Mobility status;Weight bearing status;Precautions        Time: 1660-6301 OT Time Calculation (min): 40 min  Charges: OT General Charges $OT Visit: 1 Visit OT Treatments $Self Care/Home Management : 38-52 mins  Barry Brunner, OT Acute Rehabilitation Services Pager [phone] Office [phone]   Chancy Milroy 08/08/2021, 10:22 AM

## 2021-08-08 NOTE — Discharge Summary (Signed)
Orthopaedic Trauma Service (OTS) Discharge Summary   Patient ID: Angela Mahoney MRN: [id number] DOB/AGE: 1961/03/30 60 y.o.  Admit date: 07/31/2021 Discharge date: 08/08/2021  Admission Diagnoses: Right trimalleolar ankle fracture   Discharge Diagnoses:  Active Problems:   Closed displaced trimalleolar fracture of right ankle   Past Medical History:  Diagnosis Date   Anxiety    Arthritis    COPD (chronic obstructive pulmonary disease) ([place]) 2017   Coronary artery disease    Isaias Cowman, PA   Depression    Dyspnea    with exertion   Falls    Fibromyalgia    GERD (gastroesophageal reflux disease)    Headache    History of fainting spells of unknown cause    ? r/t low blood pressure per daughter   Hypothyroidism    Myocardial infarction ([place]) 01/2016   widowmaker in [place]- had cardiac cath   Pre-diabetes    diet controlled - no meds   Seasonal allergies    Sleep apnea    does not use cpap     Procedures Performed: ORIF right ankle and External Fixation right ankle  Discharged Condition: good/stable  Hospital Course: Patient was admitted directly from [facility] clinic on 07/30/2021.  Underwent external fixation of the right ankle on 07/31/2021 by Dr. Doreatha Martin.  Ankle was wrapped with compressive Ace wrap to assist with swelling.  Patient evaluated over the next 5 days and soft tissues improved to a level to safely proceed with definitive fixation of patient's ankle fracture.  She was taken back to the operating room by Dr. Doreatha Martin on 08/06/2021 for the removal of external fixation and subsequent ORIF.  She tolerated this well without complications.  Was placed in a short leg splint postoperatively and instructed to be nonweightbearing on the right lower extremity.  She was started on Lovenox for DVT prophylaxis starting on postoperative day #1 she began working with physical and occupational therapy starting on postoperative day #1.  Therapies initial recommendation  was SNF but due to patient progressing well with therapies they updated the recommendation to home health therapies.   On 08/08/2021, the patient was tolerating diet, working well with therapies, pain well controlled, vital signs stable, dressings clean, dry, intact and felt stable for discharge to home. Patient will follow up as below and knows to call with questions or concerns.     Consults: None  Significant Diagnostic Studies:   Results for orders placed or performed during the hospital encounter of 07/31/21 (from the past 168 hour(s))  Basic metabolic panel   Collection Time: 08/02/21  2:23 AM  Result Value Ref Range   Sodium 143 135 - 145 mmol/L   Potassium 3.1 (L) 3.5 - 5.1 mmol/L   Chloride 109 98 - 111 mmol/L   CO2 25 22 - 32 mmol/L   Glucose, Bld 70 70 - 99 mg/dL   BUN 10 8 - 23 mg/dL   Creatinine, Ser 0.73 0.44 - 1.00 mg/dL   Calcium 9.0 8.9 - 10.3 mg/dL   GFR, Estimated >60 >60 mL/min   Anion gap 9 5 - 15  CBC   Collection Time: 08/02/21  2:23 AM  Result Value Ref Range   WBC 6.2 4.0 - 10.5 K/uL   RBC 3.93 3.87 - 5.11 MIL/uL   Hemoglobin 11.6 (L) 12.0 - 15.0 g/dL   HCT 35.7 (L) 36.0 - 46.0 %   MCV 90.8 80.0 - 100.0 fL   MCH 29.5 26.0 - 34.0 pg   MCHC  32.5 30.0 - 36.0 g/dL   RDW 13.8 11.5 - 15.5 %   Platelets 241 150 - 400 K/uL   nRBC 0.0 0.0 - 0.2 %  Basic metabolic panel   Collection Time: 08/03/21  1:10 AM  Result Value Ref Range   Sodium 141 135 - 145 mmol/L   Potassium 3.6 3.5 - 5.1 mmol/L   Chloride 108 98 - 111 mmol/L   CO2 27 22 - 32 mmol/L   Glucose, Bld 87 70 - 99 mg/dL   BUN 8 8 - 23 mg/dL   Creatinine, Ser 0.79 0.44 - 1.00 mg/dL   Calcium 8.9 8.9 - 10.3 mg/dL   GFR, Estimated >60 >60 mL/min   Anion gap 6 5 - 15  Basic metabolic panel   Collection Time: 08/07/21  1:28 AM  Result Value Ref Range   Sodium 138 135 - 145 mmol/L   Potassium 4.1 3.5 - 5.1 mmol/L   Chloride 105 98 - 111 mmol/L   CO2 28 22 - 32 mmol/L   Glucose, Bld 127 (H) 70 - 99  mg/dL   BUN 10 8 - 23 mg/dL   Creatinine, Ser 0.75 0.44 - 1.00 mg/dL   Calcium 8.7 (L) 8.9 - 10.3 mg/dL   GFR, Estimated >60 >60 mL/min   Anion gap 5 5 - 15  CBC   Collection Time: 08/07/21  1:28 AM  Result Value Ref Range   WBC 6.8 4.0 - 10.5 K/uL   RBC 3.79 (L) 3.87 - 5.11 MIL/uL   Hemoglobin 10.9 (L) 12.0 - 15.0 g/dL   HCT 34.8 (L) 36.0 - 46.0 %   MCV 91.8 80.0 - 100.0 fL   MCH 28.8 26.0 - 34.0 pg   MCHC 31.3 30.0 - 36.0 g/dL   RDW 13.4 11.5 - 15.5 %   Platelets 266 150 - 400 K/uL   nRBC 0.0 0.0 - 0.2 %  Basic metabolic panel   Collection Time: 08/08/21  1:32 AM  Result Value Ref Range   Sodium 140 135 - 145 mmol/L   Potassium 3.5 3.5 - 5.1 mmol/L   Chloride 107 98 - 111 mmol/L   CO2 27 22 - 32 mmol/L   Glucose, Bld 81 70 - 99 mg/dL   BUN 11 8 - 23 mg/dL   Creatinine, Ser 0.68 0.44 - 1.00 mg/dL   Calcium 8.6 (L) 8.9 - 10.3 mg/dL   GFR, Estimated >60 >60 mL/min   Anion gap 6 5 - 15  CBC   Collection Time: 08/08/21  1:32 AM  Result Value Ref Range   WBC 6.7 4.0 - 10.5 K/uL   RBC 3.72 (L) 3.87 - 5.11 MIL/uL   Hemoglobin 11.0 (L) 12.0 - 15.0 g/dL   HCT 34.6 (L) 36.0 - 46.0 %   MCV 93.0 80.0 - 100.0 fL   MCH 29.6 26.0 - 34.0 pg   MCHC 31.8 30.0 - 36.0 g/dL   RDW 13.9 11.5 - 15.5 %   Platelets 246 150 - 400 K/uL   nRBC 0.0 0.0 - 0.2 %     Treatments: IV hydration, antibiotics: Ancef, analgesia: acetaminophen, Vicodin, Morphine, and oxycodone, anticoagulation: LMW heparin, therapies: PT and OT, and surgery: as above  Discharge Exam: General: Sitting up in bed, NAD Respiratory: No increased work of breathing.  RLE: Short leg splint in place. Able to wiggle toes. Non-tender above splint. Foot warm and well perfused. Endorses sensation to light touch over the foot.  Disposition: Discharge disposition: 06-Home-Health Care Svc  Allergies as of 08/08/2021       Reactions   Zolpidem Swelling   Tongue swelling, thrush symptoms.        Medication List      STOP taking these medications    HYDROcodone-acetaminophen 5-325 MG tablet Commonly known as: NORCO/VICODIN Replaced by: HYDROcodone-acetaminophen 7.5-325 MG tablet   Ozempic (1 MG/DOSE) 4 MG/3ML Sopn Generic drug: Semaglutide (1 MG/DOSE)       TAKE these medications    albuterol 108 (90 Base) MCG/ACT inhaler Commonly known as: VENTOLIN HFA Inhale 1-2 puffs into the lungs every 6 (six) hours as needed for shortness of breath or wheezing.   ARIPiprazole 5 MG tablet Commonly known as: ABILIFY Take 5 mg by mouth every morning.   aspirin EC 325 MG tablet Take 1 tablet (325 mg total) by mouth in the morning and at bedtime. What changed:  medication strength how much to take when to take this   atorvastatin 80 MG tablet Commonly known as: LIPITOR Take 1 tablet by mouth daily with supper.   b complex vitamins capsule Take 1 capsule by mouth daily.   Baclofen 5 MG Tabs Take 1 tablet by mouth at bedtime.   busPIRone 15 MG tablet Commonly known as: BUSPAR Take 15 mg by mouth 3 (three) times daily.   CALCIUM CITRATE + D PO Take 1 tablet by mouth daily.   celecoxib 200 MG capsule Commonly known as: CELEBREX Take 1 capsule (200 mg total) by mouth 2 (two) times daily for 14 days.   docusate sodium 100 MG capsule Commonly known as: COLACE Take 1 capsule (100 mg total) by mouth 2 (two) times daily.   DULoxetine 60 MG capsule Commonly known as: CYMBALTA Take 2 capsules (120 mg total) by mouth at bedtime. What changed: how much to take   famotidine 10 MG tablet Commonly known as: PEPCID Take 10 mg by mouth daily with breakfast.   fludrocortisone 0.1 MG tablet Commonly known as: FLORINEF Take 0.1 mg by mouth daily.   fluticasone 50 MCG/ACT nasal spray Commonly known as: FLONASE Place 1 spray into both nostrils daily as needed for allergies or rhinitis.   gabapentin 300 MG capsule Commonly known as: NEURONTIN Take 600-900 mg by mouth See admin instructions.  Take 3 capsules in the morning and 2 capsules dinner time, then take 3 capsules at bedtime.   glucosamine-chondroitin 500-400 MG tablet Take 1 tablet by mouth daily.   HYDROcodone-acetaminophen 7.5-325 MG tablet Commonly known as: NORCO Take 1-2 tablets by mouth every 4 (four) hours as needed for moderate pain or severe pain. Replaces: HYDROcodone-acetaminophen 5-325 MG tablet   levocetirizine 5 MG tablet Commonly known as: XYZAL Take 5 mg by mouth daily.   multivitamin with minerals Tabs tablet Take 1 tablet by mouth daily.   Narcan 4 MG/0.1ML Liqd nasal spray kit Generic drug: naloxone Place 1 spray into the nose as needed for opioid reversal.   nitroGLYCERIN 0.4 MG SL tablet Commonly known as: NITROSTAT Place 0.4 mg under the tongue every 5 (five) minutes as needed for chest pain.   ondansetron 4 MG disintegrating tablet Commonly known as: ZOFRAN-ODT Take 1 tablet by mouth every 8 (eight) hours as needed for nausea/vomiting.   phentermine 37.5 MG tablet Commonly known as: ADIPEX-P Take 37.5 mg by mouth daily before breakfast.   topiramate 50 MG tablet Commonly known as: TOPAMAX Take 50 mg by mouth at bedtime. Prescribed for weight loss   VITAMIN C-ROSE HIPS CR 1500 MG Tbcr Take 1,500  mg by mouth daily.               Durable Medical Equipment  (From admission, onward)           Start     Ordered   08/07/21 1243  For home use only DME 3 n 1  Once       Comments: Drop arm bedside commode   08/07/21 1242   08/07/21 1243  For home use only DME Walker rolling  Once       Question Answer Comment  Walker: With [place] Wheels   Patient needs a walker to treat with the following condition Weakness      08/07/21 1242            Follow-up Information     [facility] Follow up.   Specialty: Home Health Services Contact information: [address] [place] [address] [phone]         Shona Needles, MD. Schedule an  appointment as soon as possible for a visit in 2 week(s).   Specialty: Orthopedic Surgery Why: for repeat x-rays, wound check, suture/splint removal Contact information: [place] [address] [phone]                 Discharge Instructions and Plan: Patient will be discharged to home with home health therapies. Will remain NWB RLE in her splint. Will be discharged on Aspirin 325 mg BIDfor DVT prophylaxis. Patient has been provided with all the necessary DME for discharge. Patient will follow up with Dr. Doreatha Martin in 2 weeks for repeat x-rays and suture removal.   Signed:  Leary Roca. Carmie Kanner ?[phone]? (phone) 08/08/2021, 10:16 AM  [facility] [place] [address] [phone] (F)

## 2021-08-08 NOTE — Plan of Care (Signed)

## 2021-10-27 IMAGING — DX DG CHEST 1V PORT
1 series · 1 of 1 positions shown · non-contrast
Comparison: None.

CLINICAL DATA: Syncope

EXAM:
PORTABLE CHEST 1 VIEW

[chest ap]
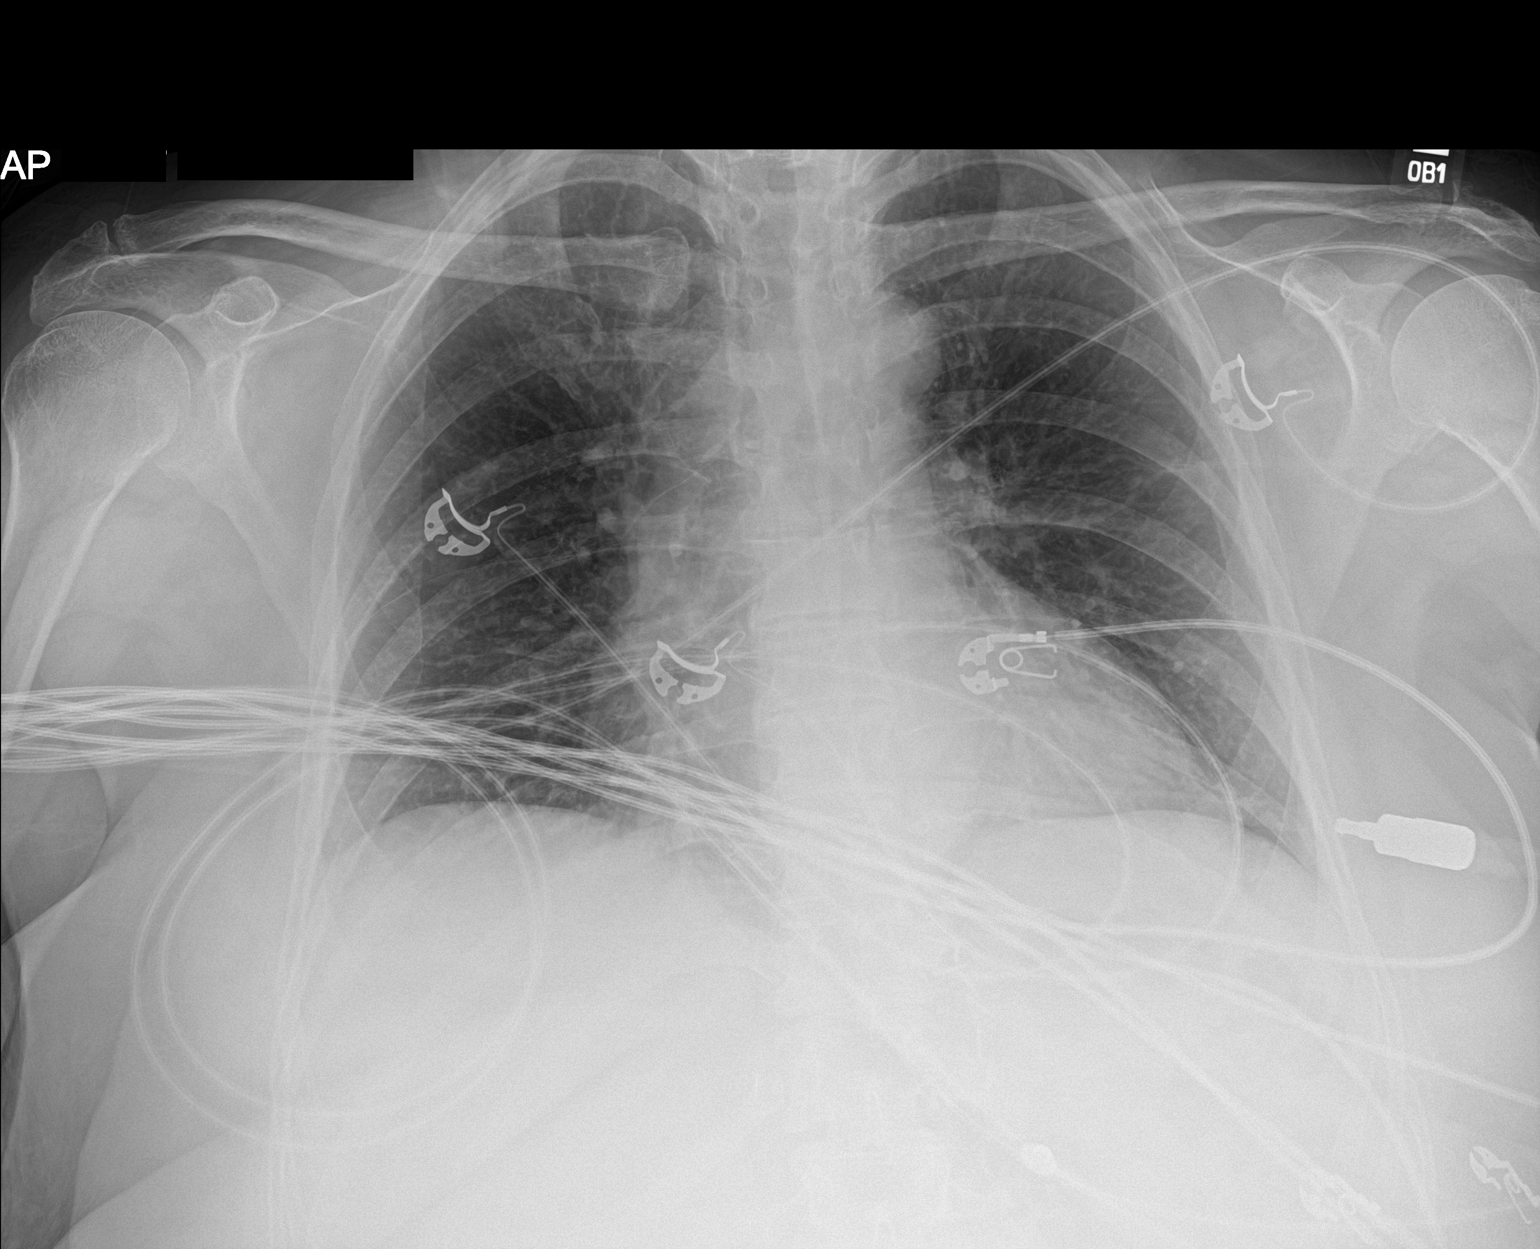

[1 of 1 positions shown; findings below may reference images not displayed]

FINDINGS: The heart size and mediastinal contours are within normal limits.
Both lungs are clear. The visualized skeletal structures are
unremarkable.
IMPRESSION: No active disease.

## 2021-12-04 ENCOUNTER — Other Ambulatory Visit: Payer: Self-pay | Admitting: Student

## 2021-12-04 DIAGNOSIS — S82851D Displaced trimalleolar fracture of right lower leg, subsequent encounter for closed fracture with routine healing: Secondary | ICD-10-CM

## 2021-12-04 DIAGNOSIS — M25571 Pain in right ankle and joints of right foot: Secondary | ICD-10-CM

## 2021-12-08 ENCOUNTER — Ambulatory Visit: Payer: Self-pay | Admitting: Student

## 2021-12-08 DIAGNOSIS — S82851A Displaced trimalleolar fracture of right lower leg, initial encounter for closed fracture: Secondary | ICD-10-CM

## 2021-12-12 ENCOUNTER — Other Ambulatory Visit: Payer: Self-pay

## 2021-12-12 ENCOUNTER — Encounter (HOSPITAL_COMMUNITY): Payer: Self-pay | Admitting: Student

## 2021-12-12 NOTE — Progress Notes (Signed)
Spoke with pt for pre-op call. Pt has hx of CAD but did not have to have stents placed because the artery was only 40-50% blocked. Pt denies any recent chest pain or shortness of breath. Pt sees Arnette Felts, [place] at [facility] for her heart. Pt states she is no longer pre-diabetic.   Pt's surgery is scheduled as ambulatory so no Covid test is required prior to surgery.

## 2021-12-12 NOTE — Progress Notes (Signed)
Anesthesia Chart Review: Angela Mahoney  Case: [id number] Date/Time: 12/15/21 0715   Procedure: SYNDESMOSIS REPAIR (Right)   Anesthesia type: General   Diagnosis: Closed displaced trimalleolar fracture of right ankle, initial encounter [S82.851A]   Pre-op diagnosis: Right syndesmosis disruption   Location: [facility] OR ROOM 03 / [facility]   Surgeons: Shona Needles, MD       DISCUSSION: Patient is a 61 year old female scheduled for the above procedure. She had ED visit 07/25/21 for acute right ankle fracture. S/p closed reduction of right trimalleolar fracture with external fixation 07/31/21, and s/p removal of external fixation with ORIF right trimalleolar ankle fracture and right syndesmosis on 08/06/21.  History includes former smoker (quit 11/16/12), COPD, exertional dyspnea, CAD, OSA (does not use CPAP), pre-diabetes, GERD, fibromyalgia, hypothyroidism, gastric sleeve resection (~ 2017), syncope (recurrent?; ED evaluation at [facility] 04/03/20. Reported possibly due to hypotension, started on Florinef).    As reported by patient and daughter in September 2022, patient had cardiac work-up for syncope in [place], [place]. This included a cardiac cath (01/23/16?). [place] records also indicate a Tilt Table test was done. Patient mentioned "Widow Maker" although she did not recall having to undergo PCI--she added today that vessel had ~ 40-50% blockage which is why it was treated medically. She is on Ranexa. She denied chest pain. She does have chronic DOE. BP medications had to be discontinued due to hypotension /syncope. She is on Florinef. She denied NItro use. Her CAD is now being managed by Roque Cash, PA-C at [facility]. She had a normal echo with EF 55-60% on 07/15/21, and no significant ischemia or scar on 06/19/21 stress test.   Advised to hold phentermine starting 12/12/21 for upcoming surgery. She says she is no longer considered pre-diabetic as sugars improved. Anesthesia team to evaluate on  the day of surgery.     VS:  BP Readings from Last 3 Encounters:  08/08/21 122/78  07/25/21 101/73  07/25/21 91/65   Pulse Readings from Last 3 Encounters:  08/08/21 81  07/25/21 70  07/25/21 76     PROVIDERS: Center, [facility] is where she receives primary and cardiology care. She sees Fredrich Romans, PA-C (PCP) and Roque Cash, PA-C (cardiology). 07/17/21 office visit with Roque Cash is scanned under Media tab, Correspondence, Enc 07/31/21. Baclofen was decreased to 5 mg at bedtime only, and metoprolol discontinued due to dizziness with history of syncope/hypotension. Advised to not skip meals while taking Ozempic to avoid hypoglycemia. Florinef added for hypotension.     LABS: For day of surgery as indicated. As of 08/08/21, Cr 0.68, H/H 11.0/34.6, PLT 246, glucose 81.    EKG: EKG 07/31/21:  Normal sinus rhythm Low voltage QRS ST & T wave abnormality, consider inferior ischemia ST & T wave abnormality, consider anterior ischemia Abnormal ECG - Inferolateral T wave abnormality is more pronounced when compared to 06/22/2021 EKG from [facility]  EKG June 22, 2021 [facility]; scanned under Media tab, Correspondence, Enc 07/31/21) Normal sinus rhythm Nonspecific T wave abnormality    CV: Echo 07/15/21 [facility]; scanned under Media tab, Correspondence, Enc 07/31/21): Conclusions 1.  Normal study: Normal cardiac chamber sizes and function; LVEF 55 to 60%.  Normal valve anatomy and function; no pericardial effusion or intracardiac mass.  No intracardiac shunts by 2D and color-flow imaging.  Normal thoracic aorta and aortic arch.     Nuclear stress test 06/19/21 [facility] Medical; scanned under Media tab, Correspondence, Enc 07/31/21): Conclusions 1.  The  resting ECG shows normal sinus rhythm.  The resting and stress ECG shows normal ST segment and no ventricular tachycardia, significant QRS prolongation or heart block.  Both rest and stress images are within normal limits.  No  significant reversible ischemia or fixed scar.  Gated left ventricular ejection fraction is normal (> or = to 67%).  Normal LV segmental wall motion.     She reportedly had a cardiac cath on 01/23/16 at [facility] in [place], [place]. She did not recall a PCI but mentioned "Widow Maker". No matching patients found in search for outside records at [facility].     Past Medical History:  Diagnosis Date   Anxiety    Arthritis    COPD (chronic obstructive pulmonary disease) ([place]) 2017   Coronary artery disease    Isaias Cowman, [place]   Depression    Dyspnea    with exertion   Falls    Fibromyalgia    GERD (gastroesophageal reflux disease)    Headache    History of fainting spells of unknown cause    ? r/t low blood pressure per daughter   Hypothyroidism    Myocardial infarction [facility]) 01/2016   widowmaker in [place]- had cardiac cath   Pre-diabetes    diet controlled - no meds   Seasonal allergies    Sleep apnea    does not use cpap    Past Surgical History:  Procedure Laterality Date   BUNIONECTOMY Right 2002   CARDIAC CATHETERIZATION  01/23/2016   In [place]     x 2 at ages 41yr and 29yr - [place]   EXTERNAL FIXATION REMOVAL Right 08/06/2021   Procedure: REMOVAL EXTERNAL FIXATION LEG;  Surgeon: Shona Needles, MD;  Location: [place];  Service: Orthopedics;  Laterality: Right;  zimmer biomet ex-fix removal   LAPAROSCOPIC GASTRIC SLEEVE RESECTION  2017   [place]   MULTIPLE TOOTH EXTRACTIONS     has upper and lower dentures   ORIF ANKLE FRACTURE Right 08/06/2021   Procedure: OPEN REDUCTION INTERNAL FIXATION (ORIF) ANKLE FRACTURE;  Surgeon: Shona Needles, MD;  Location: [place];  Service: Orthopedics;  Laterality: Right;  smith and nephew    MEDICATIONS: No current facility-administered medications for this encounter.    albuterol (VENTOLIN HFA) 108 (90 Base) MCG/ACT inhaler   ARIPiprazole (ABILIFY) 5 MG  tablet   Ascorbic Acid (VITAMIN C-ROSE HIPS CR) 1500 MG TBCR   atorvastatin (LIPITOR) 80 MG tablet   b complex vitamins capsule   Baclofen 5 MG TABS   busPIRone (BUSPAR) 15 MG tablet   Calcium Citrate-Vitamin D (CALCIUM CITRATE + D PO)   DULoxetine (CYMBALTA) 60 MG capsule   famotidine (PEPCID) 10 MG tablet   fludrocortisone (FLORINEF) 0.1 MG tablet   fluticasone (FLONASE) 50 MCG/ACT nasal spray   gabapentin (NEURONTIN) 300 MG capsule   glucosamine-chondroitin 500-400 MG tablet   levocetirizine (XYZAL) 5 MG tablet   Multiple Vitamin (MULTIVITAMIN WITH MINERALS) TABS tablet   NARCAN 4 MG/0.1ML LIQD nasal spray kit   nitroGLYCERIN (NITROSTAT) 0.4 MG SL tablet   ondansetron (ZOFRAN-ODT) 4 MG disintegrating tablet   phentermine (ADIPEX-P) 37.5 MG tablet   Semaglutide, 1 MG/DOSE, (OZEMPIC, 1 MG/DOSE,) 4 MG/3ML SOPN   topiramate (TOPAMAX) 50 MG tablet   docusate sodium (COLACE) 100 MG capsule   HYDROcodone-acetaminophen (NORCO) 7.5-325 MG tablet    Myra Gianotti, PA-C Surgical Short Stay/Anesthesiology [facility] Phone [phone] [facility] Phone [phone] 12/12/2021 11:07  AM

## 2021-12-14 NOTE — H&P (Signed)
[facility]opaedic Trauma Service (OTS) H&P  Patient ID: Angela Mahoney MRN: [id number] DOB/AGE: 04/08/1961 61 y.o.  Reason for Surgery: Right ankle syndesmosis repair  HPI: Angela Mahoney is an 61 y.o. female with a history of COPD, CAD, fibromyalgia, thyroid disease presenting for surgery on right ankle.  Patient sustained a ground-level fall which resulted in a right ankle fracture on 07/30/2021.  Patient initially taken for external fixation of the right ankle and subsequently removal of external fixation and ORIF of right ankle fracture on 08/06/2021.  In the initial postoperative period, patient was doing extremely well and progressing quickly with mobility and therapies with minimal pain.  Over the last 4 weeks patient noted to have increasing pain throughout the ankle and difficulty progressing with her mobility.  Most recent imaging performed in OTS clinic showed some increased widening between the tibia and fibula despite syndesmotic fixation being in place.  It appears that she has failed this fixation which is contributing to her pain at this time.  She presents today for repair of the syndesmosis.   Past Medical History:  Diagnosis Date   Anemia    Anxiety    Arthritis    COPD (chronic obstructive pulmonary disease) (HCC) 2017   Coronary artery disease    Roxanne Mins, [place]   Depression    Dyspnea    with exertion   Falls    Fibromyalgia    GERD (gastroesophageal reflux disease)    Headache    History of fainting spells of unknown cause    ? r/t low blood pressure per daughter   Hypothyroidism    no longer treated   Myocardial infarction (HCC) 01/2016   widowmaker in [place]- had cardiac cath   Seasonal allergies    Sleep apnea    does not use cpap    Past Surgical History:  Procedure Laterality Date   BUNIONECTOMY Right 2002   CARDIAC CATHETERIZATION  01/23/2016   In [place]   COLONOSCOPY     x 2 at ages 69yr and 40yr - [place]   EXTERNAL FIXATION  REMOVAL Right 08/06/2021   Procedure: REMOVAL EXTERNAL FIXATION LEG;  Surgeon: Roby Lofts, MD;  Location: [facility] OR;  Service: Orthopedics;  Laterality: Right;  zimmer biomet ex-fix removal   LAPAROSCOPIC GASTRIC SLEEVE RESECTION  2017   [place]   MULTIPLE TOOTH EXTRACTIONS     has upper and lower dentures   ORIF ANKLE FRACTURE Right 08/06/2021   Procedure: OPEN REDUCTION INTERNAL FIXATION (ORIF) ANKLE FRACTURE;  Surgeon: Roby Lofts, MD;  Location: [facility] OR;  Service: Orthopedics;  Laterality: Right;  smith and nephew    History reviewed. No pertinent family history.  Social History:  reports that she quit smoking about 9 years ago. Her smoking use included cigarettes. She has a 22.50 pack-year smoking history. She has never used smokeless tobacco. She reports that she does not drink alcohol and does not use drugs.  Allergies:  Allergies  Allergen Reactions   Zolpidem Swelling    Tongue swelling, thrush symptoms.    Medications: I have reviewed the patient's current medications. Prior to Admission:  No medications prior to admission.    ROS: Constitutional: No fever or chills Vision: No changes in vision ENT: No difficulty swallowing CV: No chest pain Pulm: No SOB or wheezing GI: No nausea or vomiting GU: No urgency or inability to hold urine Skin: No poor wound healing Neurologic: No numbness or tingling Psychiatric: No depression or anxiety Heme: No bruising Allergic:  No reaction to medications or food   Exam: There were no vitals taken for this visit. General:NAD Orientation:Alert and oriented x4 Mood and Affect: Mood and affect appropriate, pleasant and cooperative Gait: Antalgic gait  Coordination and balance: Within normal limits  RLE: Well-healed surgical scars over the medial and lateral ankle.  Swelling about the ankle and foot.  Tenderness with palpation both medially and laterally.  Tolerates ankle dorsiflexion/plantarflexion but does endorse some  discomfort with this.  Sensation is intact throughout the foot.  Neurovascularly intact.  LLE: Skin without lesions. No tenderness to palpation. Full painless ROM, full strength in each muscle groups without evidence of instability.   Medical Decision Making: Data: Imaging: AP, lateral, mortise view of right ankle shows medial and lateral fixation in place.  Fractures remain in appropriate alignment.  There is some widening between the tibia and fibula, which is increased from previous imaging.  Labs: No results found for this or any previous visit (from the past 168 hour(s)).  Assessment/Plan: 61 year old female status post external fixation with subsequent ORIF of right trimalleolar ankle fracture/dislocation on 08/06/2021  Patient appears to have failed her syndesmotic fixation.  We recommend proceeding with repair of the syndesmosis.  Risk and benefits of the procedure were discussed with the patient.  She agrees to proceed with surgery.  We will plan to discharge patient home postoperatively.    Thompson Caul PA-C [facility] [phone] (office) [email or web address]

## 2021-12-15 ENCOUNTER — Encounter (HOSPITAL_COMMUNITY)
Admission: RE | Disposition: A | Discharge status: Home / Self Care | Payer: Self-pay | Source: Home / Self Care | Attending: Student

## 2021-12-15 ENCOUNTER — Ambulatory Visit (HOSPITAL_COMMUNITY): Payer: Medicare HMO | Admitting: Vascular Surgery

## 2021-12-15 ENCOUNTER — Other Ambulatory Visit: Payer: Self-pay

## 2021-12-15 ENCOUNTER — Encounter (HOSPITAL_COMMUNITY): Payer: Self-pay | Admitting: Student

## 2021-12-15 ENCOUNTER — Ambulatory Visit (HOSPITAL_COMMUNITY)
Admission: RE | Admit: 2021-12-15 | Discharge: 2021-12-15 | Disposition: A | Discharge status: Home / Self Care | Payer: Medicare HMO | Attending: Student | Admitting: Student

## 2021-12-15 ENCOUNTER — Ambulatory Visit (HOSPITAL_COMMUNITY): Payer: Medicare HMO

## 2021-12-15 DIAGNOSIS — I252 Old myocardial infarction: Secondary | ICD-10-CM | POA: Insufficient documentation

## 2021-12-15 DIAGNOSIS — G4730 Sleep apnea, unspecified: Secondary | ICD-10-CM | POA: Insufficient documentation

## 2021-12-15 DIAGNOSIS — S82851A Displaced trimalleolar fracture of right lower leg, initial encounter for closed fracture: Secondary | ICD-10-CM

## 2021-12-15 DIAGNOSIS — M797 Fibromyalgia: Secondary | ICD-10-CM | POA: Insufficient documentation

## 2021-12-15 DIAGNOSIS — J449 Chronic obstructive pulmonary disease, unspecified: Secondary | ICD-10-CM | POA: Diagnosis not present

## 2021-12-15 DIAGNOSIS — Z87891 Personal history of nicotine dependence: Secondary | ICD-10-CM | POA: Insufficient documentation

## 2021-12-15 DIAGNOSIS — G8929 Other chronic pain: Secondary | ICD-10-CM | POA: Diagnosis not present

## 2021-12-15 DIAGNOSIS — S93431A Sprain of tibiofibular ligament of right ankle, initial encounter: Secondary | ICD-10-CM | POA: Diagnosis not present

## 2021-12-15 DIAGNOSIS — I2510 Atherosclerotic heart disease of native coronary artery without angina pectoris: Secondary | ICD-10-CM | POA: Diagnosis not present

## 2021-12-15 DIAGNOSIS — Z419 Encounter for procedure for purposes other than remedying health state, unspecified: Secondary | ICD-10-CM

## 2021-12-15 DIAGNOSIS — M25571 Pain in right ankle and joints of right foot: Secondary | ICD-10-CM | POA: Diagnosis not present

## 2021-12-15 DIAGNOSIS — X58XXXA Exposure to other specified factors, initial encounter: Secondary | ICD-10-CM | POA: Insufficient documentation

## 2021-12-15 DIAGNOSIS — Y9389 Activity, other specified: Secondary | ICD-10-CM | POA: Diagnosis not present

## 2021-12-15 DIAGNOSIS — T148XXA Other injury of unspecified body region, initial encounter: Secondary | ICD-10-CM

## 2021-12-15 HISTORY — PX: SYNDESMOSIS REPAIR: SHX5182

## 2021-12-15 HISTORY — DX: Anemia, unspecified: D64.9

## 2021-12-15 LAB — BASIC METABOLIC PANEL
Anion gap: 8 (ref 5–15)
BUN: 9 mg/dL (ref 8–23)
CO2: 25 mmol/L (ref 22–32)
Calcium: 9.3 mg/dL (ref 8.9–10.3)
Chloride: 108 mmol/L (ref 98–111)
Creatinine, Ser: 1.02 mg/dL — ABNORMAL HIGH (ref 0.44–1.00)
GFR, Estimated: >60 mL/min (ref >60)
Glucose, Bld: 89 mg/dL (ref 70–99)
Potassium: 3.7 mmol/L (ref 3.5–5.1)
Sodium: 141 mmol/L (ref 135–145)

## 2021-12-15 LAB — CBC WITH DIFFERENTIAL/PLATELET
Abs Immature Granulocytes: 0.02 10*3/uL (ref 0.00–0.07)
Basophils Absolute: 0 10*3/uL (ref 0.0–0.1)
Basophils Relative: 1 %
Eosinophils Absolute: 0.1 10*3/uL (ref 0.0–0.5)
Eosinophils Relative: 3 %
HCT: 43 % (ref 36.0–46.0)
Hemoglobin: 13.4 g/dL (ref 12.0–15.0)
Immature Granulocytes: 1 %
Lymphocytes Relative: 35 %
Lymphs Abs: 1.5 10*3/uL (ref 0.7–4.0)
MCH: 28.5 pg (ref 26.0–34.0)
MCHC: 31.2 g/dL (ref 30.0–36.0)
MCV: 91.3 fL (ref 80.0–100.0)
Monocytes Absolute: 0.5 10*3/uL (ref 0.1–1.0)
Monocytes Relative: 12 %
Neutro Abs: 2.1 10*3/uL (ref 1.7–7.7)
Neutrophils Relative %: 48 %
Platelets: 182 10*3/uL (ref 150–400)
RBC: 4.71 MIL/uL (ref 3.87–5.11)
RDW: 13.7 % (ref 11.5–15.5)
WBC: 4.2 10*3/uL (ref 4.0–10.5)
nRBC: 0 % (ref 0.0–0.2)

## 2021-12-15 LAB — SURGICAL PCR SCREEN

## 2021-12-15 LAB — C-REACTIVE PROTEIN: CRP: 0.5 mg/dL (ref <1.0)

## 2021-12-15 LAB — SEDIMENTATION RATE: Sed Rate: 5 mm/hr (ref 0–22)

## 2021-12-15 SURGERY — REPAIR, SYNDESMOSIS, ANKLE
Anesthesia: General | Site: Ankle | Laterality: Right

## 2021-12-15 MED ORDER — LIDOCAINE 2% (20 MG/ML) 5 ML SYRINGE
INTRAMUSCULAR | Status: DC | PRN
  Administered 2021-12-15: 60 mg via INTRAVENOUS

## 2021-12-15 MED ORDER — PROPOFOL 10 MG/ML IV BOLUS
INTRAVENOUS | Status: AC
  Filled 2021-12-15: qty 20

## 2021-12-15 MED ORDER — VANCOMYCIN HCL 1000 MG IV SOLR
INTRAVENOUS | Status: DC | PRN
  Administered 2021-12-15: 1000 mg via TOPICAL

## 2021-12-15 MED ORDER — LIDOCAINE 2% (20 MG/ML) 5 ML SYRINGE
INTRAMUSCULAR | Status: AC
  Filled 2021-12-15: qty 5

## 2021-12-15 MED ORDER — CHLORHEXIDINE GLUCONATE 0.12 % MT SOLN
15.0000 mL | Freq: Once | OROMUCOSAL | Status: AC
  Administered 2021-12-15: 15 mL via OROMUCOSAL
  Filled 2021-12-15: qty 15

## 2021-12-15 MED ORDER — CEFAZOLIN SODIUM-DEXTROSE 2-4 GM/100ML-% IV SOLN
2.0000 g | INTRAVENOUS | Status: AC
  Administered 2021-12-15: 2 g via INTRAVENOUS
  Filled 2021-12-15: qty 100

## 2021-12-15 MED ORDER — FENTANYL CITRATE (PF) 100 MCG/2ML IJ SOLN
INTRAMUSCULAR | Status: DC | PRN
  Administered 2021-12-15: 50 ug via INTRAVENOUS

## 2021-12-15 MED ORDER — DEXAMETHASONE SODIUM PHOSPHATE 10 MG/ML IJ SOLN
INTRAMUSCULAR | Status: DC | PRN
Start: 2021-12-15 — End: 2021-12-15
  Administered 2021-12-15: 10 mg via INTRAVENOUS

## 2021-12-15 MED ORDER — FENTANYL CITRATE (PF) 100 MCG/2ML IJ SOLN
INTRAMUSCULAR | Status: AC
  Filled 2021-12-15: qty 2

## 2021-12-15 MED ORDER — MIDAZOLAM HCL 5 MG/5ML IJ SOLN
INTRAMUSCULAR | Status: DC | PRN
Start: 2021-12-15 — End: 2021-12-15
  Administered 2021-12-15: 1 mg via INTRAVENOUS

## 2021-12-15 MED ORDER — ONDANSETRON HCL 4 MG/2ML IJ SOLN
INTRAMUSCULAR | Status: AC
  Filled 2021-12-15: qty 2

## 2021-12-15 MED ORDER — DEXAMETHASONE SODIUM PHOSPHATE 10 MG/ML IJ SOLN
INTRAMUSCULAR | Status: AC
  Filled 2021-12-15: qty 1

## 2021-12-15 MED ORDER — VANCOMYCIN HCL 1000 MG IV SOLR
INTRAVENOUS | Status: AC
  Filled 2021-12-15: qty 20

## 2021-12-15 MED ORDER — MIDAZOLAM HCL 2 MG/2ML IJ SOLN
INTRAMUSCULAR | Status: AC
  Filled 2021-12-15: qty 2

## 2021-12-15 MED ORDER — 0.9 % SODIUM CHLORIDE (POUR BTL) OPTIME
TOPICAL | Status: DC | PRN
  Administered 2021-12-15: 1000 mL

## 2021-12-15 MED ORDER — LIDOCAINE HCL (PF) 2 % IJ SOLN
INTRAMUSCULAR | Status: DC | PRN
Start: 2021-12-15 — End: 2021-12-15
  Administered 2021-12-15: 100 mg via PERINEURAL

## 2021-12-15 MED ORDER — LACTATED RINGERS IV SOLN: INTRAVENOUS | Status: DC

## 2021-12-15 MED ORDER — ONDANSETRON HCL 4 MG/2ML IJ SOLN
INTRAMUSCULAR | Status: DC | PRN
Start: 2021-12-15 — End: 2021-12-15
  Administered 2021-12-15: 4 mg via INTRAVENOUS

## 2021-12-15 MED ORDER — ORAL CARE MOUTH RINSE: 15.0000 mL | Freq: Once | OROMUCOSAL | Status: AC

## 2021-12-15 MED ORDER — ROPIVACAINE HCL 7.5 MG/ML IJ SOLN
INTRAMUSCULAR | Status: DC | PRN
Start: 2021-12-15 — End: 2021-12-15
  Administered 2021-12-15: 15 mL via PERINEURAL

## 2021-12-15 MED ORDER — BUPIVACAINE-EPINEPHRINE (PF) 0.5% -1:200000 IJ SOLN
INTRAMUSCULAR | Status: DC | PRN
  Administered 2021-12-15: 25 mL via PERINEURAL

## 2021-12-15 MED ORDER — PROPOFOL 10 MG/ML IV BOLUS
INTRAVENOUS | Status: DC | PRN
  Administered 2021-12-15: 10 mg via INTRAVENOUS
  Administered 2021-12-15: 200 mg via INTRAVENOUS

## 2021-12-15 MED ORDER — HYDROCODONE-ACETAMINOPHEN 7.5-325 MG PO TABS: 1.0000 | ORAL_TABLET | ORAL | 0 refills | Status: DC | PRN

## 2021-12-15 MED ORDER — SUCCINYLCHOLINE CHLORIDE 200 MG/10ML IV SOSY
PREFILLED_SYRINGE | INTRAVENOUS | Status: DC | PRN
  Administered 2021-12-15: 160 mg via INTRAVENOUS

## 2021-12-15 SURGICAL SUPPLY — 55 items
BAG COUNTER SPONGE SURGICOUNT (BAG) ×2 IMPLANT
BANDAGE ESMARK 6X9 LF (GAUZE/BANDAGES/DRESSINGS) ×1 IMPLANT
BIT DRILL QC 2.5MM SHRT EVO SM (DRILL) IMPLANT
BNDG COHESIVE 4X5 TAN STRL (GAUZE/BANDAGES/DRESSINGS) ×1 IMPLANT
BNDG ELASTIC 4X5.8 VLCR STR LF (GAUZE/BANDAGES/DRESSINGS) ×1 IMPLANT
BNDG ELASTIC 6X5.8 VLCR STR LF (GAUZE/BANDAGES/DRESSINGS) IMPLANT
BNDG ESMARK 6X9 LF (GAUZE/BANDAGES/DRESSINGS) ×2
BRUSH SCRUB EZ PLAIN DRY (MISCELLANEOUS) ×3 IMPLANT
CHLORAPREP W/TINT 26 (MISCELLANEOUS) ×2 IMPLANT
COVER SURGICAL LIGHT HANDLE (MISCELLANEOUS) ×2 IMPLANT
DRAPE C-ARM 42X72 X-RAY (DRAPES) ×2 IMPLANT
DRAPE C-ARMOR (DRAPES) ×2 IMPLANT
DRAPE ORTHO SPLIT 77X108 STRL (DRAPES) ×2
DRAPE SURG ORHT 6 SPLT 77X108 (DRAPES) ×2 IMPLANT
DRAPE U-SHAPE 47X51 STRL (DRAPES) ×2 IMPLANT
DRILL QC 2.5MM SHORT EVOS SM (DRILL) ×2
DRSG ADAPTIC 3X8 NADH LF (GAUZE/BANDAGES/DRESSINGS) IMPLANT
ELECT REM PT RETURN 9FT ADLT (ELECTROSURGICAL) ×2
ELECTRODE REM PT RTRN 9FT ADLT (ELECTROSURGICAL) ×1 IMPLANT
GAUZE SPONGE 4X4 12PLY STRL (GAUZE/BANDAGES/DRESSINGS) ×1 IMPLANT
GLOVE SURG ENC MOIS LTX SZ6.5 (GLOVE) ×6 IMPLANT
GLOVE SURG ENC MOIS LTX SZ7.5 (GLOVE) ×6 IMPLANT
GLOVE SURG UNDER POLY LF SZ6.5 (GLOVE) ×2 IMPLANT
GLOVE SURG UNDER POLY LF SZ7.5 (GLOVE) ×2 IMPLANT
GOWN STRL REUS W/ TWL LRG LVL3 (GOWN DISPOSABLE) ×2 IMPLANT
GOWN STRL REUS W/TWL LRG LVL3 (GOWN DISPOSABLE) ×2
KIT TURNOVER KIT B (KITS) ×2 IMPLANT
MANIFOLD NEPTUNE II (INSTRUMENTS) ×1 IMPLANT
NDL HYPO 21X1.5 SAFETY (NEEDLE) IMPLANT
NDL HYPO 25GX1X1/2 BEV (NEEDLE) ×1 IMPLANT
NEEDLE HYPO 21X1.5 SAFETY (NEEDLE) IMPLANT
NEEDLE HYPO 25GX1X1/2 BEV (NEEDLE) IMPLANT
NS IRRIG 1000ML POUR BTL (IV SOLUTION) ×2 IMPLANT
PACK TOTAL JOINT (CUSTOM PROCEDURE TRAY) ×2 IMPLANT
PAD ARMBOARD 7.5X6 YLW CONV (MISCELLANEOUS) ×4 IMPLANT
PAD CAST 4YDX4 CTTN HI CHSV (CAST SUPPLIES) IMPLANT
PADDING CAST COTTON 4X4 STRL (CAST SUPPLIES) ×1
PADDING CAST COTTON 6X4 STRL (CAST SUPPLIES) IMPLANT
SCREW CTX 3.5X50MM EVOS (Screw) ×1 IMPLANT
SPONGE T-LAP 18X18 ~~LOC~~+RFID (SPONGE) IMPLANT
STAPLER VISISTAT 35W (STAPLE) ×2 IMPLANT
STRIP CLOSURE SKIN 1/2X4 (GAUZE/BANDAGES/DRESSINGS) ×1 IMPLANT
SUCTION FRAZIER HANDLE 10FR (MISCELLANEOUS) ×1
SUCTION TUBE FRAZIER 10FR DISP (MISCELLANEOUS) ×1 IMPLANT
SUT ETHILON 3 0 PS 1 (SUTURE) ×2 IMPLANT
SUT PROLENE 0 CT (SUTURE) IMPLANT
SUT VIC AB 0 CT1 27 (SUTURE)
SUT VIC AB 0 CT1 27XBRD ANBCTR (SUTURE) ×1 IMPLANT
SUT VIC AB 2-0 CT1 27 (SUTURE) ×1
SUT VIC AB 2-0 CT1 TAPERPNT 27 (SUTURE) ×2 IMPLANT
SYR CONTROL 10ML LL (SYRINGE) ×1 IMPLANT
TOWEL GREEN STERILE (TOWEL DISPOSABLE) ×4 IMPLANT
TOWEL GREEN STERILE FF (TOWEL DISPOSABLE) ×2 IMPLANT
UNDERPAD 30X36 HEAVY ABSORB (UNDERPADS AND DIAPERS) ×2 IMPLANT
WATER STERILE IRR 1000ML POUR (IV SOLUTION) ×2 IMPLANT

## 2021-12-15 NOTE — Anesthesia Procedure Notes (Signed)
Anesthesia Regional Block: Adductor canal block   Pre-Anesthetic Checklist: , timeout performed,  Correct Patient, Correct Site, Correct Laterality,  Correct Procedure, Correct Position, site marked,  Risks and benefits discussed,  Surgical consent,  Pre-op evaluation,  At surgeon's request and post-op pain management  Laterality: Right and Lower  Prep: chloraprep       Needles:  Injection technique: Single-shot      Needle Length: 5cm  Needle Gauge: 22     Additional Needles: Arrow StimuQuik ECHO Echogenic Stimulating PNB Needle  Procedures:,,,, ultrasound used (permanent image in chart),,    Narrative:  Start time: 12/15/2021 7:18 AM End time: 12/15/2021 7:23 AM Injection made incrementally with aspirations every 5 mL.  Performed by: Personally  Anesthesiologist: Val Eagle, MD

## 2021-12-15 NOTE — Anesthesia Procedure Notes (Signed)
Procedure Name: Intubation Date/Time: 12/15/2021 7:45 AM Performed by: Gwyndolyn Saxon, CRNA Pre-anesthesia Checklist: Patient identified, Emergency Drugs available, Suction available and Patient being monitored Patient Re-evaluated:Patient Re-evaluated prior to induction Oxygen Delivery Method: Circle system utilized Preoxygenation: Pre-oxygenation with 100% oxygen Induction Type: IV induction, Cricoid Pressure applied and Rapid sequence Laryngoscope Size: Mac and 3 Grade View: Grade I Tube size: 7.0 mm Number of attempts: 1 Airway Equipment and Method: Stylet Placement Confirmation: ETT inserted through vocal cords under direct vision, positive ETCO2 and breath sounds checked- equal and bilateral Secured at: 21 cm Tube secured with: Tape Dental Injury: Teeth and Oropharynx as per pre-operative assessment  Comments: Pt took ozempic yesterday and is at risk for delayed gastric emptying despite adequate NPO status

## 2021-12-15 NOTE — Transfer of Care (Signed)
Immediate Anesthesia Transfer of Care Note  Patient: Angela Mahoney  Procedure(s) Performed: SYNDESMOSIS REPAIR (Right: Ankle)  Patient Location: PACU  Anesthesia Type:General and Regional  Level of Consciousness: drowsy and patient cooperative  Airway & Oxygen Therapy: Patient Spontanous Breathing and Patient connected to nasal cannula oxygen  Post-op Assessment: Report given to RN and Post -op Vital signs reviewed and stable  Post vital signs: Reviewed and stable  Last Vitals:  Vitals Value Taken Time  BP 117/63 12/15/21 0851  Temp    Pulse 81 12/15/21 0851  Resp 14 12/15/21 0851  SpO2 100 % 12/15/21 0851  Vitals shown include unvalidated device data.  Last Pain:  Vitals:   12/15/21 2751  TempSrc:   PainSc: 6       Patients Stated Pain Goal: 3 (12/15/21 7001)  Complications: No notable events documented.

## 2021-12-15 NOTE — Op Note (Signed)
Orthopaedic Surgery Operative Note (CSN: [id number] ) Date of Surgery: 12/15/2021  Admit Date: 12/15/2021   Diagnoses: Pre-Op Diagnoses: Persistent pain and swelling follow ORIF of right trimalleolar ankle fracture  Post-Op Diagnosis: Failed syndesmosis repair  Procedures: CPT [id number] reduction internal fixation of right syndesmosis CPT 20680-Removal of hardware right ankle  Surgeons : Primary: Shona Needles, MD  Assistant: Patrecia Pace, PA-C  Location: OR 3   Anesthesia:General with regional   Antibiotics: Ancef 2g preopwith 1 gm vancomycin powder   Tourniquet time:None    Estimated Blood AB-[id number] mL  Complications:None   Specimens:None   Implants: Implant Name Type Inv. Item Serial No. Manufacturer Lot No. LRB No. Used Action  SCREW CTX 3.5X50MM EVOS [id number] Screw SCREW CTX 3.5X50MM EVOS  [employer]RAY Right 1 Implanted     Indications for Surgery: 61 year old female who sustained a right trimalleolar ankle fracture dislocation, and underwent external fixation and subsequent open reduction internal fixation.  This was performed in September 2022.  She initially did very well but over the last months she has had increased amount of pain especially with weightbearing and increased amount of swelling.  After reviewing her imaging which showed some potential increase in the clear space between her tibia and fibula I recommended removal of the suture fixation with revision fixation of her syndesmosis.  Risks and benefits were discussed with the patient and her family.  Risks include but not limited to bleeding, infection, malunion, nonunion, hardware failure, heart rotation, nerve or blood vessel injury, even the possibility anesthetic complications.  She agreed to proceed with surgery and consent was obtained.  Operative Findings: 1.  Some instability of the syndesmosis with cotton stress testing showing some variability in the syndesmosis and the  tibiofibular clear space. 2.  Removal of previous suture button fixation and placement of a [employer] EVOS 3.5 mm quadricortical syndesmotic fixation  Procedure: The patient was identified in the preoperative holding area. Consent was confirmed with the patient and their family and all questions were answered. The operative extremity was marked after confirmation with the patient. she was then brought back to the operating room by our anesthesia colleagues.  She was carefully transferred over to a radiolucent flat top table.  She was placed under general anesthetic.  A bump was placed under her operative hip.  The right lower extremity was then prepped and draped in usual sterile fashion.  A timeout was performed to verify the patient, the procedure, and the extremity.  Preoperative antibiotics were dosed.  Refer started out by performing an external rotation stress view which showed no significant medial clear space widening.  I then marked out a small incision over the previous scar where the syndesmosis fixation was and carried this down through skin and subcutaneous tissue.  I then performed a cotton test grabbing the fibula with a towel clip and provide a lateral stress view which showed some instability of the syndesmosis with live fluoroscopic imaging.  At this point I felt that proceeding with revision syndesmosis fixation was most appropriate.  I cut the suture from the suture fixation and made a small medial incision to visualize the medial aspect of the tibia and cut the suture and pulled the metal out.  I was able to remove the remainder from the lateral side.  I then used a reduction tenaculum to clamp the fibula to the tibia with the ankle in full dorsiflexion.  I then drilled and placed 3.5 millimeter screws  across the fibula and tibia gaining bicortical fixation on both.  2 screws were placed.  The clamp was removed.  The clear space was diminished significantly and there was no motion  or movement with cotton testing.  The incision was irrigated.  Final fluoroscopic imaging was obtained.  A gram of vancomycin powder was placed into the incisions.  A layered closure of 2-0 Vicryl and 3-0 Monocryl used to close the skin.  Sterile dressings were applied.  Patient was then awoke from anesthesia and taken to the PACU in stable condition.  Post Op Plan/Instructions: Patient be nonweightbearing to the right lower extremity.  She will go back into her boot she may have active range of motion as tolerated.  Return in 2 weeks for x-rays and wound check.  I was present and performed the entire surgery.  Patrecia Pace, PA-C did assist me throughout the case. An assistant was necessary given the difficulty in approach, maintenance of reduction and ability to instrument the fracture.   Katha Hamming, MD [facility]

## 2021-12-15 NOTE — Interval H&P Note (Signed)
History and Physical Interval Note:  12/15/2021 7:23 AM  Angela Mahoney  has presented today for surgery, with the diagnosis of Right syndesmosis disruption.  The various methods of treatment have been discussed with the patient and family. After consideration of risks, benefits and other options for treatment, the patient has consented to  Procedure(s): SYNDESMOSIS REPAIR (Right) as a surgical intervention.  The patient's history has been reviewed, patient examined, no change in status, stable for surgery.  I have reviewed the patient's chart and labs.  Questions were answered to the patient's satisfaction.     Caryn Bee P Roxas Clymer

## 2021-12-15 NOTE — Discharge Instructions (Addendum)
[facility]opaedic [facility]uma Service Discharge Instructions   General Discharge Instructions  WEIGHT BEARING STATUS: Non-weightbearing right lower extremity in CAM boot  RANGE OF MOTION/ACTIVITY:Ok to come out of black CAM boot for ankle motion as tolerated  Wound Care:You may remove your surgical dressing on post-op day #3 (Thursday 12/18/21). Incisions are closed with dissolvable sutures and covered with steri-strips, do not remove these. Incisions can be left open to air if there is no drainage. If incision continues to have drainage, follow wound care instructions below. Okay to shower if no drainage from incisions.  DVT/PE prophylaxis: Aspirin  Diet: as you were eating previously.  Can use over the counter stool softeners and bowel preparations, such as Miralax, to help with bowel movements.  Narcotics can be constipating.  Be sure to drink plenty of fluids  PAIN MEDICATION USE AND EXPECTATIONS  You have likely been given narcotic medications to help control your pain.  After a traumatic event that results in an fracture (broken bone) with or without surgery, it is ok to use narcotic pain medications to help control one's pain.  We understand that everyone responds to pain differently and each individual patient will be evaluated on a regular basis for the continued need for narcotic medications. Ideally, narcotic medication use should last no more than 6-8 weeks (coinciding with fracture healing).   As a patient it is your responsibility as well to monitor narcotic medication use and report the amount and frequency you use these medications when you come to your office visit.   We would also advise that if you are using narcotic medications, you should take a dose prior to therapy to maximize you participation.  IF YOU ARE ON NARCOTIC MEDICATIONS IT IS NOT PERMISSIBLE TO OPERATE A MOTOR VEHICLE (MOTORCYCLE/CAR/TRUCK/MOPED) OR HEAVY MACHINERY DO NOT MIX NARCOTICS WITH OTHER CNS (CENTRAL NERVOUS  SYSTEM) DEPRESSANTS SUCH AS ALCOHOL   STOP SMOKING OR USING NICOTINE PRODUCTS!!!!  As discussed nicotine severely impairs your body's ability to heal surgical and traumatic wounds but also impairs bone healing.  Wounds and bone heal by forming microscopic blood vessels (angiogenesis) and nicotine is a vasoconstrictor (essentially, shrinks blood vessels).  Therefore, if vasoconstriction occurs to these microscopic blood vessels they essentially disappear and are unable to deliver necessary nutrients to the healing tissue.  This is one modifiable factor that you can do to dramatically increase your chances of healing your injury.    (This means no smoking, no nicotine gum, patches, etc)   ICE AND ELEVATE INJURED/OPERATIVE EXTREMITY  Using ice and elevating the injured extremity above your heart can help with swelling and pain control.  Icing in a pulsatile fashion, such as 20 minutes on and 20 minutes off, can be followed.    Do not place ice directly on skin. Make sure there is a barrier between to skin and the ice pack.    Using frozen items such as frozen peas works well as the conform nicely to the are that needs to be iced.  USE AN ACE WRAP OR TED HOSE FOR SWELLING CONTROL  In addition to icing and elevation, Ace wraps or TED hose are used to help limit and resolve swelling.  It is recommended to use Ace wraps or TED hose until you are informed to stop.    When using Ace Wraps start the wrapping distally (farthest away from the body) and wrap proximally (closer to the body)   Example: If you had surgery on your leg or thing and  you do not have a splint on, start the ace wrap at the toes and work your way up to the thigh        If you had surgery on your upper extremity and do not have a splint on, start the ace wrap at your fingers and work your way up to the upper arm  IF YOU ARE IN A CAM BOOT (BLACK BOOT)  You may remove boot periodically. Perform daily dressing changes as noted below.  Wash  the liner of the boot regularly and wear a sock when wearing the boot. It is recommended that you sleep in the boot until told otherwise   CALL THE OFFICE WITH ANY QUESTIONS OR CONCERNS: [phone]   VISIT OUR WEBSITE FOR ADDITIONAL INFORMATION: [email or web address]        Discharge Wound Care Instructions  Do NOT apply any ointments, solutions or lotions to pin sites or surgical wounds.  These prevent needed drainage and even though solutions like hydrogen peroxide kill bacteria, they also damage cells lining the pin sites that help fight infection.  Applying lotions or ointments can keep the wounds moist and can cause them to breakdown and open up as well. This can increase the risk for infection. When in doubt call the office.   If any drainage is noted, use one layer of adaptic, then gauze, Kerlix, and an ace wrap.  Once the incision is completely dry and without drainage, it may be left open to air out.  Showering may begin 36-48 hours later.  Cleaning gently with soap and water.  Traumatic wounds should be dressed daily as well.    One layer of adaptic, gauze, Kerlix, then ace wrap.  The adaptic can be discontinued once the draining has ceased    If you have a wet to dry dressing: wet the gauze with saline the squeeze as much saline out so the gauze is moist (not soaking wet), place moistened gauze over wound, then place a dry gauze over the moist one, followed by Kerlix wrap, then ace wrap.

## 2021-12-15 NOTE — Anesthesia Procedure Notes (Signed)
Anesthesia Regional Block: Sciatic   Pre-Anesthetic Checklist: , timeout performed,  Correct Patient, Correct Site, Correct Laterality,  Correct Procedure, Correct Position, site marked,  Risks and benefits discussed,  Surgical consent,  Pre-op evaluation,  At surgeon's request and post-op pain management  Laterality: Right and Lower  Prep: chloraprep       Needles:  Injection technique: Single-shot      Needle Length: 9cm  Needle Gauge: 22     Additional Needles: Arrow StimuQuik ECHO Echogenic Stimulating PNB Needle  Procedures:,,,, ultrasound used (permanent image in chart),,    Narrative:  Start time: 12/15/2021 7:23 AM End time: 12/15/2021 7:28 AM Injection made incrementally with aspirations every 5 mL.  Performed by: Personally  Anesthesiologist: Oleta Mouse, MD

## 2021-12-15 NOTE — Anesthesia Preprocedure Evaluation (Signed)
Anesthesia Evaluation  Patient identified by MRN, date of birth, ID band Patient awake    Reviewed: Allergy & Precautions, NPO status , Patient's Chart, lab work & pertinent test results  History of Anesthesia Complications Negative for: history of anesthetic complications  Airway Mallampati: III  TM Distance: >3 FB Neck ROM: Full    Dental  (+) Dental Advisory Given, Edentulous Upper, Edentulous Lower   Pulmonary shortness of breath, sleep apnea , COPD, former smoker,    breath sounds clear to auscultation       Cardiovascular (-) angina+ CAD and + Past MI   Rhythm:Regular  : Patient is a 60 year old female scheduled for the above procedure. She had ED visit 07/25/21 for acute right ankle fracture. S/p closed reduction of right trimalleolar fracture with external fixation 07/31/21, and s/p removal of external fixation with ORIF right trimalleolar ankle fracture and right syndesmosis on 08/06/21.  History includes former smoker(quit 11/16/12), COPD,exertional dyspnea,CAD,OSA (does not use CPAP), pre-diabetes, GERD,fibromyalgia, hypothyroidism, gastric sleeve resection (~ 2017), syncope (recurrent?; ED evaluation at [facility] 04/03/20. Reported possibly due to hypotension, started on Florinef).  As reported by patient and daughter in September 2022, patient had cardiac work-up for syncope in [place], [place]. This included a cardiac cath (01/23/16?). [facility] Medical records also indicate a Tilt Table test was done. Patient mentioned "Widow Maker" although she did not recall having to undergo PCI--she added today that vessel had ~ 40-50% blockage which is why it was treated medically. She is on Ranexa. She denied chest pain. She does have chronic DOE. BP medications had to be discontinued due to hypotension /syncope. She is on Florinef. She denied NItro use. Her CAD is now being managed by Arnette Felts, PA-C at [facility]. She had a normal  echo with EF 55-60% on 07/15/21, and no significant ischemia or scar on 06/19/21 stress test.   Advised to hold phentermine starting 12/12/21 for upcoming surgery. She says she is no longer considered pre-diabetic as sugars improved. Anesthesia team to evaluate on the day of surgery.    Neuro/Psych  Headaches, PSYCHIATRIC DISORDERS Anxiety Depression  Neuromuscular disease    GI/Hepatic Neg liver ROS, GERD  Medicated,  Endo/Other  Hypothyroidism   Renal/GU Lab Results      Component                Value               Date                      CREATININE               1.02 (H)            12/15/2021                Musculoskeletal  (+) Arthritis , Fibromyalgia -  Abdominal   Peds  Hematology negative hematology ROS (+) Lab Results      Component                Value               Date                      WBC                      4.2  12/15/2021                HGB                      13.4                12/15/2021                HCT                      43.0                12/15/2021                MCV                      91.3                12/15/2021                PLT                      182                 12/15/2021              Anesthesia Other Findings   Reproductive/Obstetrics                             Anesthesia Physical Anesthesia Plan  ASA: 3  Anesthesia Plan: General   Post-op Pain Management: Regional block   Induction: Intravenous, Rapid sequence and Cricoid pressure planned  PONV Risk Score and Plan: 3 and Ondansetron and Dexamethasone  Airway Management Planned: Oral ETT  Additional Equipment: None  Intra-op Plan:   Post-operative Plan: Extubation in OR  Informed Consent: I have reviewed the patients History and Physical, chart, labs and discussed the procedure including the risks, benefits and alternatives for the proposed anesthesia with the patient or authorized representative who has indicated his/her  understanding and acceptance.     Dental advisory given  Plan Discussed with: CRNA and Anesthesiologist  Anesthesia Plan Comments:         Anesthesia Quick Evaluation

## 2021-12-16 ENCOUNTER — Encounter (HOSPITAL_COMMUNITY): Payer: Self-pay | Admitting: Student

## 2021-12-21 NOTE — Anesthesia Postprocedure Evaluation (Signed)
Anesthesia Post Note  Patient: Angela Mahoney  Procedure(s) Performed: SYNDESMOSIS REPAIR (Right: Ankle)     Patient location during evaluation: PACU Anesthesia Type: General and Regional Level of consciousness: awake and alert Pain management: pain level controlled Vital Signs Assessment: post-procedure vital signs reviewed and stable Respiratory status: spontaneous breathing, nonlabored ventilation, respiratory function stable and patient connected to nasal cannula oxygen Cardiovascular status: blood pressure returned to baseline and stable Postop Assessment: no apparent nausea or vomiting Anesthetic complications: no   No notable events documented.  Last Vitals:  Vitals:   12/15/21 0905 12/15/21 0920  BP: 108/63 110/72  Pulse: 83 73  Resp: 16 10  Temp:  36.4 C  SpO2: 98% 96%    Last Pain:  Vitals:   12/15/21 0920  TempSrc:   PainSc: 0-No pain                 Lige Lakeman

## 2021-12-23 ENCOUNTER — Other Ambulatory Visit: Payer: Medicare (Managed Care)

## 2022-06-23 ENCOUNTER — Other Ambulatory Visit (HOSPITAL_COMMUNITY): Payer: Self-pay | Admitting: Student

## 2022-06-23 DIAGNOSIS — R0989 Other specified symptoms and signs involving the circulatory and respiratory systems: Secondary | ICD-10-CM

## 2022-06-29 ENCOUNTER — Ambulatory Visit (HOSPITAL_COMMUNITY)
Admission: RE | Admit: 2022-06-29 | Discharge: 2022-06-29 | Disposition: A | Discharge status: Home / Self Care | Payer: Medicare HMO | Source: Ambulatory Visit | Attending: Cardiovascular Disease | Admitting: Cardiovascular Disease

## 2022-06-29 DIAGNOSIS — R0989 Other specified symptoms and signs involving the circulatory and respiratory systems: Secondary | ICD-10-CM

## 2022-07-16 DIAGNOSIS — E039 Hypothyroidism, unspecified: Secondary | ICD-10-CM | POA: Insufficient documentation

## 2022-07-16 DIAGNOSIS — F331 Major depressive disorder, recurrent, moderate: Secondary | ICD-10-CM | POA: Insufficient documentation

## 2022-07-16 DIAGNOSIS — G4733 Obstructive sleep apnea (adult) (pediatric): Secondary | ICD-10-CM | POA: Insufficient documentation

## 2022-07-16 DIAGNOSIS — I252 Old myocardial infarction: Secondary | ICD-10-CM | POA: Insufficient documentation

## 2022-08-19 DIAGNOSIS — E7800 Pure hypercholesterolemia, unspecified: Secondary | ICD-10-CM | POA: Insufficient documentation

## 2022-09-27 DIAGNOSIS — R296 Repeated falls: Secondary | ICD-10-CM | POA: Insufficient documentation

## 2022-10-22 LAB — COLOGUARD: COLOGUARD: NEGATIVE

## 2022-10-26 DIAGNOSIS — Z9049 Acquired absence of other specified parts of digestive tract: Secondary | ICD-10-CM

## 2022-10-26 HISTORY — DX: Acquired absence of other specified parts of digestive tract: Z90.49

## 2022-10-29 DIAGNOSIS — R32 Unspecified urinary incontinence: Secondary | ICD-10-CM | POA: Insufficient documentation

## 2023-01-01 DIAGNOSIS — F17221 Nicotine dependence, chewing tobacco, in remission: Secondary | ICD-10-CM | POA: Insufficient documentation

## 2023-01-01 DIAGNOSIS — R079 Chest pain, unspecified: Secondary | ICD-10-CM | POA: Insufficient documentation

## 2023-01-01 HISTORY — DX: Nicotine dependence, chewing tobacco, in remission: F17.221

## 2023-04-07 ENCOUNTER — Other Ambulatory Visit: Payer: Self-pay

## 2023-04-07 ENCOUNTER — Encounter: Payer: Self-pay | Admitting: Student

## 2023-04-07 ENCOUNTER — Ambulatory Visit (INDEPENDENT_AMBULATORY_CARE_PROVIDER_SITE_OTHER): Payer: Medicare HMO | Admitting: Student

## 2023-04-07 VITALS — BP 130/76 | HR 72 | Temp 98.4°F | Ht 65.0 in | Wt 230.8 lb

## 2023-04-07 DIAGNOSIS — I959 Hypotension, unspecified: Secondary | ICD-10-CM | POA: Insufficient documentation

## 2023-04-07 DIAGNOSIS — Z87891 Personal history of nicotine dependence: Secondary | ICD-10-CM

## 2023-04-07 DIAGNOSIS — Z9884 Bariatric surgery status: Secondary | ICD-10-CM

## 2023-04-07 DIAGNOSIS — Z6835 Body mass index (BMI) 35.0-35.9, adult: Secondary | ICD-10-CM

## 2023-04-07 DIAGNOSIS — S82851D Displaced trimalleolar fracture of right lower leg, subsequent encounter for closed fracture with routine healing: Secondary | ICD-10-CM | POA: Diagnosis not present

## 2023-04-07 DIAGNOSIS — J449 Chronic obstructive pulmonary disease, unspecified: Secondary | ICD-10-CM

## 2023-04-07 DIAGNOSIS — E66812 Obesity, class 2: Secondary | ICD-10-CM

## 2023-04-07 DIAGNOSIS — E669 Obesity, unspecified: Secondary | ICD-10-CM

## 2023-04-07 DIAGNOSIS — F419 Anxiety disorder, unspecified: Secondary | ICD-10-CM

## 2023-04-07 DIAGNOSIS — Z0000 Encounter for general adult medical examination without abnormal findings: Secondary | ICD-10-CM

## 2023-04-07 DIAGNOSIS — M797 Fibromyalgia: Secondary | ICD-10-CM

## 2023-04-07 DIAGNOSIS — K219 Gastro-esophageal reflux disease without esophagitis: Secondary | ICD-10-CM

## 2023-04-07 DIAGNOSIS — I2510 Atherosclerotic heart disease of native coronary artery without angina pectoris: Secondary | ICD-10-CM

## 2023-04-07 DIAGNOSIS — S82851S Displaced trimalleolar fracture of right lower leg, sequela: Secondary | ICD-10-CM

## 2023-04-07 NOTE — Assessment & Plan Note (Signed)
Copd well controlled only using albuterol as needed. Occasionally needs this during ankle PT. Saturating well with normal lung exam today. Request records regarding prior PFTs.

## 2023-04-07 NOTE — Assessment & Plan Note (Signed)
Has been having difficulties with dizziness with standing and palpitations, associate with hypotension.  Dizziness is associated with standing and improves after a few minutes with sitting or resting.  Has been evaluated by cardiology endocrinology recently. Recently seen cardiology for this issue with Dr. Jodie Echevaria.  Noted that cath in 2017 with mild coronary plaques and normal ECG she at visit in February 2024.  Nitroglycerin was discontinued given episodes of hypotension.  Currently working up palpitations with cardiac monitor and has follow-up visit next month.  Cardiologist also referred her to endocrinology for further workup of her hypotension.  Workup for adrenal insufficiency as well as renin and aldosterone levels have been unremarkable.  Per endocrinology notes apparently patient has had tilt table test in the past to workup for POTS.   Currently taking Florinef 0.2 mg daily.  Blood pressure today is normotensive with negative orthostatic vitals.  Appropriate increase in heart rate with standing.  Normal extraocular movements without nystagmus. patient did report symptoms of dizziness with standing.  Blood pressures improved with Florinef however continues to endorse symptoms of dizziness.  Patient is notably on multiple centrally acting medications which may be contributing to her symptoms.  Request records from prior providers Continue Florinef Follow-up with cardiology and endocrinology Encourage patient to wear compression socks and counterpressure measures

## 2023-04-07 NOTE — Patient Instructions (Addendum)
It was a pleasure seeing you in clinic today.  Please continue your current medications, we will request record form you prior primary care doctors.   I have sent a referral for the nutritionalist to discuss eating   Please continue wearing compression stocking to help with dizziness  Please call our clinic if you are having falls, worsening low blood pressure, or dizziness  Follow up in about 1 month

## 2023-04-07 NOTE — Progress Notes (Unsigned)
CC: New patient  HPI:  Angela Mahoney is a 62 y.o. female with a past medical history of COPD, hypothyroidism, prior MI who presents to establish care.    History: Medical: COPD, anxiety, depression, fibromyalgia, GERD, prior MI, hypothyroidism, questionable adrenal insufficiency Medications: Albuterol every 6 hours as needed, Abilify 5 mg daily, aspirin 81 mg daily, Lipitor 80 mg daily, baclofen 5 mg nightly, BuSpar 15 mg 3 times daily, Colace 100 mg twice daily, 120 mg daily, Pepcid 10 mg daily, fludrocortisone grams daily, gabapentin glucosamine-chondroitin Norco, Xyzal 5 mg daily, phentermine Ozempic 1 mg weekly Topamax milligrams daily Family: Surgical: Allergies: Social:  Care gaps: HIV, hepatitis C, Tdap, Pap smear, colonoscopy, screening, mammogram,   Seen by endocrinology for hypertension and physical exam, questionable adrenal insufficiency. Plan labs at that time on 03/15/2023 from ACTH 19.4, cortisol 12.2, 21-hydroxylase antibodies negative, aldosterone 1.1 BMP: Sodium 142, potassium 4.0, creatinine 0.91 Renin plasma activity 0.195  01/01/2023: A1c 5.6 Lipid profile LDL 54, cholesterol 157, HDL 74, triglycerides 83  Patient saw cardiologist on 02/16/2 and 24 Dr. Jodie Echevaria  Aspirin was discontinued as cath in 01/23/2016 reported LAD proximal to mid only 35% and RCA proximal 40% plaques.  Patient had a zio patch placed. Past Medical History:  Diagnosis Date   Anemia    Anxiety    Arthritis    COPD (chronic obstructive pulmonary disease) (HCC) 2017   Coronary artery disease    Roxanne Mins, [place]   Depression    Dyspnea    with exertion   Falls    Fibromyalgia    GERD (gastroesophageal reflux disease)    Headache    History of fainting spells of unknown cause    ? r/t low blood pressure per daughter   Hypothyroidism    no longer treated   Myocardial infarction (HCC) 01/2016   widowmaker in [place]- had cardiac cath   Seasonal allergies    Sleep apnea     does not use cpap     Current Outpatient Medications:    albuterol (VENTOLIN HFA) 108 (90 Base) MCG/ACT inhaler, Inhale 1-2 puffs into the lungs every 6 (six) hours as needed for shortness of breath or wheezing., Disp: , Rfl:    ARIPiprazole (ABILIFY) 5 MG tablet, Take 5 mg by mouth every morning., Disp: , Rfl:    Ascorbic Acid (VITAMIN C-ROSE HIPS CR) 1500 MG TBCR, Take 1,500 mg by mouth daily., Disp: , Rfl:    aspirin EC 81 MG tablet, Take 81 mg by mouth daily. Swallow whole., Disp: , Rfl:    atorvastatin (LIPITOR) 80 MG tablet, Take 80 mg by mouth daily with supper., Disp: , Rfl:    b complex vitamins capsule, Take 1 capsule by mouth daily., Disp: , Rfl:    Baclofen 5 MG TABS, Take 5 mg by mouth at bedtime., Disp: , Rfl:    busPIRone (BUSPAR) 15 MG tablet, Take 15 mg by mouth 3 (three) times daily., Disp: , Rfl:    Calcium Citrate-Vitamin D (CALCIUM CITRATE + D PO), Take 1 tablet by mouth daily., Disp: , Rfl:    docusate sodium (COLACE) 100 MG capsule, Take 1 capsule (100 mg total) by mouth 2 (two) times daily. (Patient not taking: Reported on 12/08/2021), Disp: 10 capsule, Rfl: 0   DULoxetine (CYMBALTA) 60 MG capsule, Take 2 capsules (120 mg total) by mouth at bedtime., Disp: , Rfl: 3   famotidine (PEPCID) 10 MG tablet, Take 10 mg by mouth daily with breakfast., Disp: ,  Rfl:    fludrocortisone (FLORINEF) 0.1 MG tablet, Take 0.1 mg by mouth daily., Disp: , Rfl:    fluticasone (FLONASE) 50 MCG/ACT nasal spray, Place 1 spray into both nostrils daily as needed for allergies or rhinitis., Disp: , Rfl:    gabapentin (NEURONTIN) 300 MG capsule, Take 600-900 mg by mouth See admin instructions. Take 3 capsules in the morning and 2 capsules dinner time, then take 3 capsules at bedtime., Disp: , Rfl:    glucosamine-chondroitin 500-400 MG tablet, Take 1 tablet by mouth daily., Disp: , Rfl:    HYDROcodone-acetaminophen (NORCO) 7.5-325 MG tablet, Take 1-2 tablets by mouth every 4 (four) hours as needed  for severe pain or moderate pain., Disp: 40 tablet, Rfl: 0   levocetirizine (XYZAL) 5 MG tablet, Take 5 mg by mouth daily., Disp: , Rfl:    Multiple Vitamin (MULTIVITAMIN WITH MINERALS) TABS tablet, Take 1 tablet by mouth daily., Disp: , Rfl:    NARCAN 4 MG/0.1ML LIQD nasal spray kit, Place 1 spray into the nose as needed for opioid reversal., Disp: , Rfl:    nitroGLYCERIN (NITROSTAT) 0.4 MG SL tablet, Place 0.4 mg under the tongue every 5 (five) minutes as needed for chest pain., Disp: , Rfl:    ondansetron (ZOFRAN-ODT) 4 MG disintegrating tablet, Take 1 tablet by mouth every 8 (eight) hours as needed for nausea/vomiting., Disp: , Rfl:    phentermine (ADIPEX-P) 37.5 MG tablet, Take 37.5 mg by mouth daily before breakfast., Disp: , Rfl:    Semaglutide, 1 MG/DOSE, (OZEMPIC, 1 MG/DOSE,) 4 MG/3ML SOPN, Inject 1 mg into the skin every Sunday., Disp: , Rfl:    topiramate (TOPAMAX) 50 MG tablet, Take 50 mg by mouth at bedtime. Prescribed for weight loss, Disp: , Rfl:   Review of Systems:  ***  Constitutional: Eye: Respiratory: Cardiovascular: GI: MSK: GU: Skin: Neuro: Endocrine:   Physical Exam:  There were no vitals filed for this visit. *** General: Patient is sitting comfortably in the room  Eyes: Pupils equal and reactive to light, EOM intact  Head: Normocephalic, atraumatic  Neck: Supple, nontender, full range of motion, No JVD Cardio: Regular rate and rhythm, no murmurs, rubs or gallops. 2+ pulses to bilateral upper and lower extremities  Chest: No chest tenderness Pulmonary: Clear to ausculation bilaterally with no rales, rhonchi, and crackles  Abdomen: Soft, nontender with normoactive bowel sounds with no rebound or guarding  Neuro: Alert and orientated x3. CN II-XII intact. Sensation intact to upper and lower extremities. 2+ patellar reflex.  Back: No midline tenderness, no step off or deformities noted. No paraspinal muscle tenderness.  Skin: No rashes noted  MSK: 5/5  strength to upper and lower extremities.    Assessment & Plan:   No problem-specific Assessment & Plan notes found for this encounter.    Patient {GC/GE:[id number]::"discussed with","seen with"} Dr. {NAMES:[id number]::"Guilloud","Hoffman","Mullen","Narendra","Williams","Vincent"}  Modena Slater, DO PGY-1 Internal Medicine Resident  Pager: [phone]

## 2023-04-07 NOTE — Assessment & Plan Note (Signed)
Going to physical therapy, sees orthotrhoapic trauma specialist Dr. Jena Gauss. Taking tylenol as need for pain. Able to ambulate with walker without recent falls.

## 2023-04-08 DIAGNOSIS — M797 Fibromyalgia: Secondary | ICD-10-CM | POA: Insufficient documentation

## 2023-04-08 DIAGNOSIS — Z9884 Bariatric surgery status: Secondary | ICD-10-CM | POA: Insufficient documentation

## 2023-04-08 DIAGNOSIS — F419 Anxiety disorder, unspecified: Secondary | ICD-10-CM | POA: Insufficient documentation

## 2023-04-08 DIAGNOSIS — K219 Gastro-esophageal reflux disease without esophagitis: Secondary | ICD-10-CM | POA: Insufficient documentation

## 2023-04-08 DIAGNOSIS — F32A Depression, unspecified: Secondary | ICD-10-CM | POA: Insufficient documentation

## 2023-04-08 DIAGNOSIS — Z0000 Encounter for general adult medical examination without abnormal findings: Secondary | ICD-10-CM | POA: Insufficient documentation

## 2023-04-08 DIAGNOSIS — I2510 Atherosclerotic heart disease of native coronary artery without angina pectoris: Secondary | ICD-10-CM | POA: Insufficient documentation

## 2023-04-08 DIAGNOSIS — E669 Obesity, unspecified: Secondary | ICD-10-CM | POA: Insufficient documentation

## 2023-04-08 NOTE — Progress Notes (Signed)
New Patient Office Visit  Subjective    Patient ID: Angela Mahoney, female    DOB: 07-Jul-1961  Age: 62 y.o. MRN: [id number]  CC:  Chief Complaint  Patient presents with   Follow-up    PATIENT NEW TO GET ESTABLISHED    HPI Angela Mahoney is a 62 year old person who presents to establish care.  Outpatient Encounter Medications as of 04/07/2023  Medication Sig   loratadine (CLARITIN) 10 MG tablet Take by mouth.   oxybutynin (DITROPAN-XL) 5 MG 24 hr tablet Take 5 mg by mouth daily.   albuterol (VENTOLIN HFA) 108 (90 Base) MCG/ACT inhaler Inhale 1-2 puffs into the lungs every 6 (six) hours as needed for shortness of breath or wheezing.   Ascorbic Acid (VITAMIN C-ROSE HIPS CR) 1500 MG TBCR Take 1,500 mg by mouth daily.   aspirin EC 81 MG tablet Take 81 mg by mouth daily. Swallow whole.   atorvastatin (LIPITOR) 80 MG tablet Take 80 mg by mouth daily with supper.   b complex vitamins capsule Take 1 capsule by mouth daily.   Baclofen 5 MG TABS Take 5 mg by mouth at bedtime.   busPIRone (BUSPAR) 15 MG tablet Take 15 mg by mouth 3 (three) times daily.   Calcium Citrate-Vitamin D (CALCIUM CITRATE + D PO) Take 1 tablet by mouth daily.   DULoxetine (CYMBALTA) 60 MG capsule Take 2 capsules (120 mg total) by mouth at bedtime.   famotidine (PEPCID) 10 MG tablet Take 10 mg by mouth daily with breakfast.   fludrocortisone (FLORINEF) 0.1 MG tablet Take 0.2 mg by mouth daily.   fluticasone (FLONASE) 50 MCG/ACT nasal spray Place 1 spray into both nostrils daily as needed for allergies or rhinitis.   gabapentin (NEURONTIN) 300 MG capsule Take 600-900 mg by mouth See admin instructions. Take 3 capsules in the morning and 2 capsules dinner time, then take 3 capsules at bedtime.   Multiple Vitamin (MULTIVITAMIN WITH MINERALS) TABS tablet Take 1 tablet by mouth daily.   ondansetron (ZOFRAN-ODT) 4 MG disintegrating tablet Take 1 tablet by mouth every 8 (eight) hours as needed for  nausea/vomiting.   phentermine (ADIPEX-P) 37.5 MG tablet Take 37.5 mg by mouth daily before breakfast.   topiramate (TOPAMAX) 50 MG tablet Take 50 mg by mouth at bedtime. Prescribed for weight loss   [DISCONTINUED] ARIPiprazole (ABILIFY) 5 MG tablet Take 5 mg by mouth every morning.   [DISCONTINUED] docusate sodium (COLACE) 100 MG capsule Take 1 capsule (100 mg total) by mouth 2 (two) times daily. (Patient not taking: Reported on 12/08/2021)   [DISCONTINUED] glucosamine-chondroitin 500-400 MG tablet Take 1 tablet by mouth daily.   [DISCONTINUED] HYDROcodone-acetaminophen (NORCO) 7.5-325 MG tablet Take 1-2 tablets by mouth every 4 (four) hours as needed for severe pain or moderate pain.   [DISCONTINUED] levocetirizine (XYZAL) 5 MG tablet Take 5 mg by mouth daily.   [DISCONTINUED] NARCAN 4 MG/0.1ML LIQD nasal spray kit Place 1 spray into the nose as needed for opioid reversal.   [DISCONTINUED] nitroGLYCERIN (NITROSTAT) 0.4 MG SL tablet Place 0.4 mg under the tongue every 5 (five) minutes as needed for chest pain.   [DISCONTINUED] Semaglutide, 1 MG/DOSE, (OZEMPIC, 1 MG/DOSE,) 4 MG/3ML SOPN Inject 1 mg into the skin every Sunday.   No facility-administered encounter medications on file as of 04/07/2023.    Past Medical History:  Diagnosis Date   Anemia    Anxiety    Arthritis    COPD (chronic obstructive pulmonary disease) (HCC) 2017  Coronary artery disease    Roxanne Mins, [place]   Depression    Dyspnea    with exertion   Falls    Fibromyalgia    GERD (gastroesophageal reflux disease)    Headache    History of fainting spells of unknown cause    ? r/t low blood pressure per daughter   Hypothyroidism    no longer treated   Myocardial infarction (HCC) 01/2016   widowmaker in [place]- had cardiac cath   Seasonal allergies    Sleep apnea    does not use cpap    Past Surgical History:  Procedure Laterality Date   BUNIONECTOMY Right 2002   CARDIAC CATHETERIZATION  01/23/2016   In  [place]   COLONOSCOPY     x 2 at ages 84yr and 35yr - [place]   EXTERNAL FIXATION REMOVAL Right 08/06/2021   Procedure: REMOVAL EXTERNAL FIXATION LEG;  Surgeon: Roby Lofts, MD;  Location: [facility] OR;  Service: Orthopedics;  Laterality: Right;  zimmer biomet ex-fix removal   LAPAROSCOPIC GASTRIC SLEEVE RESECTION  2017   [place]   MULTIPLE TOOTH EXTRACTIONS     has upper and lower dentures   ORIF ANKLE FRACTURE Right 08/06/2021   Procedure: OPEN REDUCTION INTERNAL FIXATION (ORIF) ANKLE FRACTURE;  Surgeon: Roby Lofts, MD;  Location: [facility] OR;  Service: Orthopedics;  Laterality: Right;  smith and nephew   SYNDESMOSIS REPAIR Right 12/15/2021   Procedure: SYNDESMOSIS REPAIR;  Surgeon: Roby Lofts, MD;  Location: [facility] OR;  Service: Orthopedics;  Laterality: Right;    No family history on file.  Social History   Socioeconomic History   Marital status: Married    Spouse name: Not on file   Number of children: Not on file   Years of education: Not on file   Highest education level: Not on file  Occupational History   Not on file  Tobacco Use   Smoking status: Former    Packs/day: 0.50    Years: 45.00    Additional pack years: 0.00    Total pack years: 22.50    Types: Cigarettes    Quit date: 2014    Years since quitting: 10.3   Smokeless tobacco: Never  Vaping Use   Vaping Use: Never used  Substance and Sexual Activity   Alcohol use: Never   Drug use: Never   Sexual activity: Not Currently  Other Topics Concern   Not on file  Social History Narrative   Not on file   Social Determinants of Health   Financial Resource Strain: Not on file  Food Insecurity: Not on file  Transportation Needs: Not on file  Physical Activity: Not on file  Stress: Not on file  Social Connections: Not on file  Intimate Partner Violence: Not on file    ROS: negative as per HPI      Objective    BP 130/76 (BP Location: Left Arm, Patient Position: Sitting, Cuff Size: Large)   Pulse 72    Temp 98.4 F (36.9 C) (Oral)   Ht 5\' 5"  (1.651 m)   Wt 230 lb 12.8 oz (104.7 kg)   SpO2 99%   BMI 38.41 kg/m   Physical Exam Constitutional:      Appearance: Normal appearance.  HENT:     Head: Normocephalic and atraumatic.     Mouth/Throat:     Mouth: Mucous membranes are moist.     Pharynx: Oropharynx is clear.  Eyes:     Extraocular Movements: Extraocular movements intact.  Conjunctiva/sclera: Conjunctivae normal.     Pupils: Pupils are equal, round, and reactive to light.  Cardiovascular:     Rate and Rhythm: Normal rate and regular rhythm.     Pulses: Normal pulses.     Heart sounds: No murmur heard.    No friction rub. No gallop.  Pulmonary:     Effort: Pulmonary effort is normal.     Breath sounds: No rhonchi or rales.  Abdominal:     General: Abdomen is flat. Bowel sounds are normal. There is no distension.     Palpations: Abdomen is soft.     Tenderness: There is no abdominal tenderness.  Musculoskeletal:        General: Normal range of motion.     Right lower leg: No edema.     Left lower leg: No edema.  Skin:    General: Skin is warm and dry.     Capillary Refill: Capillary refill takes less than 2 seconds.  Neurological:     General: No focal deficit present.     Mental Status: She is alert and oriented to person, place, and time.     Cranial Nerves: No cranial nerve deficit.     Motor: No weakness.     Comments: Visual fields intact, no nystagmus  Psychiatric:        Mood and Affect: Mood normal.        Behavior: Behavior normal.          Assessment & Plan:   Problem List Items Addressed This Visit     Closed displaced trimalleolar fracture of right ankle    Going to physical therapy, sees orthotrhoapic trauma specialist Dr. Jena Gauss. Taking tylenol as need for pain. Able to ambulate with walker without recent falls.        Hypotension - Primary    Has been having difficulties with dizziness with standing and palpitations, associate  with hypotension.  Dizziness is associated with standing and improves after a few minutes with sitting or resting.  Has been evaluated by cardiology endocrinology recently. Recently seen cardiology for this issue with Dr. Jodie Echevaria.  Noted that cath in 2017 with mild coronary plaques and normal ECG she at visit in February 2024.  Nitroglycerin was discontinued given episodes of hypotension.  Currently working up palpitations with cardiac monitor and has follow-up visit next month.  Cardiologist also referred her to endocrinology for further workup of her hypotension.  Workup for adrenal insufficiency as well as renin and aldosterone levels have been unremarkable.  Per endocrinology notes apparently patient has had tilt table test in the past to workup for POTS.   Currently taking Florinef 0.2 mg daily.  Blood pressure today is normotensive with negative orthostatic vitals.  Appropriate increase in heart rate with standing.  Normal extraocular movements without nystagmus. patient did report symptoms of dizziness with standing.  Blood pressures improved with Florinef however continues to endorse symptoms of dizziness.  Patient is notably on multiple centrally acting medications which may be contributing to her symptoms.  Request records from prior providers Continue Florinef Follow-up with cardiology and endocrinology Encourage patient to wear compression socks and counterpressure measures      COPD (chronic obstructive pulmonary disease) (HCC)    Copd well controlled only using albuterol as needed. Occasionally needs this during ankle PT. Saturating well with normal lung exam today. Request records regarding prior PFTs.       Relevant Medications   loratadine (CLARITIN) 10 MG tablet   History of  bariatric surgery   Fibromyalgia    Current medications include duloxetine 120 mg daily, gabapentin 300 mg 3 times a day, baclofen 5 mg daily.  Reports symptoms are well-controlled on these medicines      GERD  (gastroesophageal reflux disease)   Obesity (BMI 35.0-39.9 without comorbidity)    History of bariatric surgery. Previously on ozempic for weight loss but no longer approved by insurance. Currently taking phentermine and topiramate which she has been on for about 1 year. No weight loss on this. She is concerned she may be gaining weight but has not been using scale at home. Discussed coming off mediations as they do not seem to be helping but she is concerned she will gain more weight. She is working on getting more exercise and recently renewed gym membership. Does not feel she can afford weight management clinic. Referral made to RD.Marland Kitchen       Healthcare maintenance    Patient report she is up to date on pap smear and colonoscopy. Requested primary care records from [facility] and [facility]    Managed by psychiatry on duloxetine 120 mg daily and recently started on buspirone 15 mg TID. Has follow up with psychiatry tomorrow.       CAD (coronary artery disease)    Patient with history of cath in 2017 with 35% occlusion of the proximal LAD, and 35% of the mid LAD, proximal RCA with 40% plaque.  Currently taking atorvastatin 80 mg daily.  LDL in February was 54, total cholesterol 157, HDL 74, triglycerides 83.  ASCVD risk 7.4%.  Continue on statin therapy      Other Visit Diagnoses     Class 2 severe obesity due to excess calories with serious comorbidity and body mass index (BMI) of 38.0 to 38.9 in adult [facility])       Relevant Orders   Amb ref to Medical Nutrition Therapy-MNT       Return in about 4 weeks (around 05/05/2023).   Quincy Simmonds, MD

## 2023-04-08 NOTE — Assessment & Plan Note (Signed)
Current medications include duloxetine 120 mg daily, gabapentin 300 mg 3 times a day, baclofen 5 mg daily.  Reports symptoms are well-controlled on these medicines

## 2023-04-08 NOTE — Assessment & Plan Note (Signed)
Patient report she is up to date on pap smear and colonoscopy. Requested primary care records from [facility] and [facility]

## 2023-04-08 NOTE — Assessment & Plan Note (Signed)
Managed by psychiatry on duloxetine 120 mg daily and recently started on buspirone 15 mg TID. Has follow up with psychiatry tomorrow.

## 2023-04-08 NOTE — Assessment & Plan Note (Signed)
Patient with history of cath in 2017 with 35% occlusion of the proximal LAD, and 35% of the mid LAD, proximal RCA with 40% plaque.  Currently taking atorvastatin 80 mg daily.  LDL in February was 54, total cholesterol 157, HDL 74, triglycerides 83.  ASCVD risk 7.4%.  Continue on statin therapy

## 2023-04-08 NOTE — Assessment & Plan Note (Addendum)
History of bariatric surgery. Previously on ozempic for weight loss but no longer approved by insurance. Currently taking phentermine and topiramate which she has been on for about 1 year. No weight loss on this. She is concerned she may be gaining weight but has not been using scale at home. Discussed coming off mediations as they do not seem to be helping but she is concerned she will gain more weight. She is working on getting more exercise and recently renewed gym membership. Does not feel she can afford weight management clinic. Referral made to RD.Marland Kitchen

## 2023-04-09 NOTE — Progress Notes (Signed)
Internal Medicine Clinic Attending ? ?Case discussed with Dr. Liang  At the time of the visit.  We reviewed the resident?s history and exam and pertinent patient test results.  I agree with the assessment, diagnosis, and plan of care documented in the resident?s note. ? ?

## 2023-04-14 ENCOUNTER — Telehealth: Payer: Self-pay

## 2023-04-14 NOTE — Telephone Encounter (Signed)
Patient called regarding a referral to the nutritionist, patient stated she doesn't want to go through a program she just wants to see a nutritionist to discuss her weight and eating habits. Please return patients to discuss further.

## 2023-04-19 ENCOUNTER — Telehealth: Payer: Self-pay | Admitting: Student

## 2023-04-19 ENCOUNTER — Other Ambulatory Visit: Payer: Self-pay | Admitting: Student

## 2023-04-19 DIAGNOSIS — E669 Obesity, unspecified: Secondary | ICD-10-CM

## 2023-04-19 NOTE — Telephone Encounter (Signed)
Spoke with the pt in regard to the Nutritionist Referral Request.  The pt would rather be referred to the [facility] at [facility] instead to manage her weight.  The Current referral you had placed has been closed by and a new referral will be required.  Will you be able to place a new referral?

## 2023-05-03 DIAGNOSIS — S82851D Displaced trimalleolar fracture of right lower leg, subsequent encounter for closed fracture with routine healing: Secondary | ICD-10-CM | POA: Diagnosis not present

## 2023-05-04 DIAGNOSIS — F4381 Prolonged grief disorder: Secondary | ICD-10-CM | POA: Diagnosis not present

## 2023-05-06 ENCOUNTER — Ambulatory Visit (INDEPENDENT_AMBULATORY_CARE_PROVIDER_SITE_OTHER): Payer: Medicare HMO | Admitting: Student

## 2023-05-06 ENCOUNTER — Encounter: Payer: Self-pay | Admitting: Student

## 2023-05-06 VITALS — BP 119/88 | HR 94 | Ht 65.0 in | Wt 235.2 lb

## 2023-05-06 DIAGNOSIS — I959 Hypotension, unspecified: Secondary | ICD-10-CM | POA: Diagnosis not present

## 2023-05-06 DIAGNOSIS — E669 Obesity, unspecified: Secondary | ICD-10-CM

## 2023-05-06 DIAGNOSIS — S82851S Displaced trimalleolar fracture of right lower leg, sequela: Secondary | ICD-10-CM

## 2023-05-06 DIAGNOSIS — F419 Anxiety disorder, unspecified: Secondary | ICD-10-CM | POA: Diagnosis not present

## 2023-05-06 DIAGNOSIS — S82851D Displaced trimalleolar fracture of right lower leg, subsequent encounter for closed fracture with routine healing: Secondary | ICD-10-CM

## 2023-05-06 DIAGNOSIS — Z0000 Encounter for general adult medical examination without abnormal findings: Secondary | ICD-10-CM

## 2023-05-06 MED ORDER — BUPROPION HCL ER (XL) 150 MG PO TB24: 150.0000 mg | ORAL_TABLET | ORAL | 2 refills | Status: DC

## 2023-05-06 MED ORDER — BUPROPION HCL ER (XL) 150 MG PO TB24
150.0000 mg | ORAL_TABLET | ORAL | 2 refills | Status: DC
Start: 2023-05-06 — End: 2024-05-30

## 2023-05-06 NOTE — Patient Instructions (Addendum)
  Thank you, Ms.[name], for allowing [place] to provide your care today.   You should be contacted by someone at [facility] to get set up in their clinic. Continue to work on getting your PT so that you can get more exercise. I have refilled your Wellbutrin today but please also follow up with your psychiatrist's office help desk to be able to do your telehealth visits. Please also bring your documents of prior screening tests such as your Pap smear, colonoscopy/Cologuard, mammogram, HIV, hepatitis C, tetanus vaccine, and COVID vaccines so we can input these into our computer.   I have ordered the following labs for you:  Lab Orders  No laboratory test(s) ordered today      Referrals ordered today:   Referral Orders  No referral(s) requested today      I have ordered the following medication/changed the following medications:    Follow up: 3 months    Remember:     Should you have any questions or concerns please call the internal medicine clinic at [phone].     Rocky Morel, DO [facility]

## 2023-05-06 NOTE — Progress Notes (Signed)
CC: Follow-up  HPI:  Angela Mahoney is a 62 y.o. female with PMH as below who presents to the clinic for a follow-up appointment.  Please see assessment and plan for further details.  Past Medical History:  Diagnosis Date   Anemia    Anxiety    Arthritis    COPD (chronic obstructive pulmonary disease) (HCC) 2017   Coronary artery disease    [place]   Depression    Dyspnea    with exertion   Falls    Fibromyalgia    GERD (gastroesophageal reflux disease)    Headache    History of fainting spells of unknown cause    ? r/t low blood pressure per daughter   Hypothyroidism    no longer treated   Myocardial infarction [facility]) 01/2016   widowmaker in [place]- had cardiac cath   Seasonal allergies    Sleep apnea    does not use cpap   Review of Systems:   Pertinent items noted in HPI and/or A&P.  Physical Exam:  Vitals:   05/06/23 0950 05/06/23 1036  BP: (!) 137/91 119/88  Pulse: 79 94  SpO2: 98%   Weight: 235 lb 3.2 oz (106.7 kg)   Height: 5\' 5"  (1.651 m)     Constitutional: Well-appearing elderly female. In no acute distress. Cardio:Regular rate and rhythm. 2+ bilateral radial pulses. Pulm:Clear to auscultation bilaterally. Normal work of breathing on room air. Abdomen: Soft, non-tender, non-distended, positive bowel sounds. AOZ:HYQMVHQI for extremity edema. Skin:Warm and dry. Neuro:Alert and oriented x3. No focal deficit noted. Psych:Pleasant mood and affect.   Assessment & Plan:   Hypotension Blood pressure here 137/91.  She is doing well on fludrocortisone 0.2 mg daily.  She has seen endocrine on 4/25 and will follow-up with them again on 11/5.  Testing including ACTH, cortisol, 21-hydroxylase, aldosterone, and renin were normal.  She will also follow-up with cardiology next month on 7/19.  No significant hypotensive episodes or falls recently. - Continue fludrocortisone 0.2 mg daily  Closed displaced trimalleolar fracture of right  ankle Continues to have issues with her right foot after surgery in 10/05/2021 and 12/05/2021.  She is still waiting to get home health PT but [facility].  Unfortunately her transportation is her husband who has progressive Parkinson's.  Discussed options of transportation with Medicare.  She has applied to Medicaid already but does not qualify due to income. - Continue with planned home health PT and exercise as tolerated  Obesity (BMI 35.0-39.9 without comorbidity) Stable weight from 11/2022 at 235 pounds.  She continues on Wellbutrin 150 mg daily.  She is off phentermine.  She is waiting for referral answer from healthy weight and wellness. - Continue Wellbutrin 150 mg daily  Healthcare maintenance Reports that she had a Pap smear and mammogram without abnormality in 01/2023.  Also reports negative Cologuard sometime in 2024.  Reports having gotten her COVID vaccines, HIV screen, hep C screen, Tdap, and DEXA scan in 2021.  She does have paperwork for this at home and is instructed to bring it into her next clinic visit.  Anxiety Overall doing well on current regimen of buspirone 15 mg 3 times daily, duloxetine 120 mg daily, and topiramate 50 mg nightly.  She is having issues with telehealth appointments at her current psychiatrist.  I encouraged her to continue to work on this due to the difficulty of getting a new psychiatrist.  She still follows regularly with a counselor. -  Continue buspirone 15 mg 3 times daily, duloxetine 120 mg daily, and topiramate 50 mg nightly    Patient discussed with Dr. Dimas Chyle, DO [facility] Internal Medicine Resident PGY-1 Pager: [phone]

## 2023-05-10 DIAGNOSIS — F4381 Prolonged grief disorder: Secondary | ICD-10-CM | POA: Diagnosis not present

## 2023-05-10 NOTE — Assessment & Plan Note (Signed)
Continues to have issues with her right foot after surgery in 10/05/2021 and 12/05/2021.  She is still waiting to get home health PT but [facility].  Unfortunately her transportation is her husband who has progressive Parkinson's.  Discussed options of transportation with Medicare.  She has applied to Medicaid already but does not qualify due to income. - Continue with planned home health PT and exercise as tolerated

## 2023-05-10 NOTE — Assessment & Plan Note (Addendum)
Overall doing well on current regimen of buspirone 15 mg 3 times daily, duloxetine 120 mg daily, and topiramate 50 mg nightly.  She is having issues with telehealth appointments at her current psychiatrist.  I encouraged her to continue to work on this due to the difficulty of getting a new psychiatrist.  She still follows regularly with a counselor. - Continue buspirone 15 mg 3 times daily, duloxetine 120 mg daily, and topiramate 50 mg nightly

## 2023-05-10 NOTE — Assessment & Plan Note (Signed)
Reports that she had a Pap smear and mammogram without abnormality in 01/2023.  Also reports negative Cologuard sometime in 2024.  Reports having gotten her COVID vaccines, HIV screen, hep C screen, Tdap, and DEXA scan in 2021.  She does have paperwork for this at home and is instructed to bring it into her next clinic visit.

## 2023-05-10 NOTE — Assessment & Plan Note (Signed)
Stable weight from 11/2022 at 235 pounds.  She continues on Wellbutrin 150 mg daily.  She is off phentermine.  She is waiting for referral answer from healthy weight and wellness. - Continue Wellbutrin 150 mg daily

## 2023-05-10 NOTE — Assessment & Plan Note (Signed)
Blood pressure here 137/91.  She is doing well on fludrocortisone 0.2 mg daily.  She has seen endocrine on 4/25 and will follow-up with them again on 11/5.  Testing including ACTH, cortisol, 21-hydroxylase, aldosterone, and renin were normal.  She will also follow-up with cardiology next month on 7/19.  No significant hypotensive episodes or falls recently. - Continue fludrocortisone 0.2 mg daily

## 2023-05-11 NOTE — Progress Notes (Signed)
Internal Medicine Clinic Attending  Case discussed with Dr. Goodwin  at the time of the visit.  We reviewed the resident's history and exam and pertinent patient test results.  I agree with the assessment, diagnosis, and plan of care documented in the resident's note.  

## 2023-05-11 NOTE — Addendum Note (Signed)
Addended by: Debe Coder B on: 05/11/2023 03:01 PM   Modules accepted: Level of Service

## 2023-05-12 DIAGNOSIS — F32A Depression, unspecified: Secondary | ICD-10-CM | POA: Diagnosis not present

## 2023-05-12 DIAGNOSIS — S8291XD Unspecified fracture of right lower leg, subsequent encounter for closed fracture with routine healing: Secondary | ICD-10-CM | POA: Diagnosis not present

## 2023-05-12 DIAGNOSIS — F419 Anxiety disorder, unspecified: Secondary | ICD-10-CM | POA: Diagnosis not present

## 2023-05-12 DIAGNOSIS — Z7952 Long term (current) use of systemic steroids: Secondary | ICD-10-CM | POA: Diagnosis not present

## 2023-05-12 DIAGNOSIS — Z79899 Other long term (current) drug therapy: Secondary | ICD-10-CM | POA: Diagnosis not present

## 2023-05-12 DIAGNOSIS — M797 Fibromyalgia: Secondary | ICD-10-CM | POA: Diagnosis not present

## 2023-05-12 DIAGNOSIS — M19171 Post-traumatic osteoarthritis, right ankle and foot: Secondary | ICD-10-CM | POA: Diagnosis not present

## 2023-05-14 DIAGNOSIS — Z79899 Other long term (current) drug therapy: Secondary | ICD-10-CM | POA: Diagnosis not present

## 2023-05-14 DIAGNOSIS — M797 Fibromyalgia: Secondary | ICD-10-CM | POA: Diagnosis not present

## 2023-05-14 DIAGNOSIS — Z7952 Long term (current) use of systemic steroids: Secondary | ICD-10-CM | POA: Diagnosis not present

## 2023-05-14 DIAGNOSIS — F32A Depression, unspecified: Secondary | ICD-10-CM | POA: Diagnosis not present

## 2023-05-14 DIAGNOSIS — F419 Anxiety disorder, unspecified: Secondary | ICD-10-CM | POA: Diagnosis not present

## 2023-05-14 DIAGNOSIS — M19171 Post-traumatic osteoarthritis, right ankle and foot: Secondary | ICD-10-CM | POA: Diagnosis not present

## 2023-05-14 DIAGNOSIS — S8291XD Unspecified fracture of right lower leg, subsequent encounter for closed fracture with routine healing: Secondary | ICD-10-CM | POA: Diagnosis not present

## 2023-05-17 DIAGNOSIS — M19171 Post-traumatic osteoarthritis, right ankle and foot: Secondary | ICD-10-CM | POA: Diagnosis not present

## 2023-05-17 DIAGNOSIS — F32A Depression, unspecified: Secondary | ICD-10-CM | POA: Diagnosis not present

## 2023-05-17 DIAGNOSIS — M797 Fibromyalgia: Secondary | ICD-10-CM | POA: Diagnosis not present

## 2023-05-17 DIAGNOSIS — Z7952 Long term (current) use of systemic steroids: Secondary | ICD-10-CM | POA: Diagnosis not present

## 2023-05-17 DIAGNOSIS — S8291XD Unspecified fracture of right lower leg, subsequent encounter for closed fracture with routine healing: Secondary | ICD-10-CM | POA: Diagnosis not present

## 2023-05-17 DIAGNOSIS — F419 Anxiety disorder, unspecified: Secondary | ICD-10-CM | POA: Diagnosis not present

## 2023-05-17 DIAGNOSIS — Z79899 Other long term (current) drug therapy: Secondary | ICD-10-CM | POA: Diagnosis not present

## 2023-05-18 DIAGNOSIS — M19171 Post-traumatic osteoarthritis, right ankle and foot: Secondary | ICD-10-CM | POA: Diagnosis not present

## 2023-05-18 DIAGNOSIS — F32A Depression, unspecified: Secondary | ICD-10-CM | POA: Diagnosis not present

## 2023-05-18 DIAGNOSIS — F419 Anxiety disorder, unspecified: Secondary | ICD-10-CM | POA: Diagnosis not present

## 2023-05-18 DIAGNOSIS — Z7952 Long term (current) use of systemic steroids: Secondary | ICD-10-CM | POA: Diagnosis not present

## 2023-05-18 DIAGNOSIS — Z79899 Other long term (current) drug therapy: Secondary | ICD-10-CM | POA: Diagnosis not present

## 2023-05-18 DIAGNOSIS — M797 Fibromyalgia: Secondary | ICD-10-CM | POA: Diagnosis not present

## 2023-05-18 DIAGNOSIS — S8291XD Unspecified fracture of right lower leg, subsequent encounter for closed fracture with routine healing: Secondary | ICD-10-CM | POA: Diagnosis not present

## 2023-05-19 DIAGNOSIS — Z7952 Long term (current) use of systemic steroids: Secondary | ICD-10-CM | POA: Diagnosis not present

## 2023-05-19 DIAGNOSIS — F419 Anxiety disorder, unspecified: Secondary | ICD-10-CM | POA: Diagnosis not present

## 2023-05-19 DIAGNOSIS — M19171 Post-traumatic osteoarthritis, right ankle and foot: Secondary | ICD-10-CM | POA: Diagnosis not present

## 2023-05-19 DIAGNOSIS — F32A Depression, unspecified: Secondary | ICD-10-CM | POA: Diagnosis not present

## 2023-05-19 DIAGNOSIS — M797 Fibromyalgia: Secondary | ICD-10-CM | POA: Diagnosis not present

## 2023-05-19 DIAGNOSIS — Z79899 Other long term (current) drug therapy: Secondary | ICD-10-CM | POA: Diagnosis not present

## 2023-05-19 DIAGNOSIS — S8291XD Unspecified fracture of right lower leg, subsequent encounter for closed fracture with routine healing: Secondary | ICD-10-CM | POA: Diagnosis not present

## 2023-05-25 DIAGNOSIS — F32A Depression, unspecified: Secondary | ICD-10-CM | POA: Diagnosis not present

## 2023-05-25 DIAGNOSIS — Z7952 Long term (current) use of systemic steroids: Secondary | ICD-10-CM | POA: Diagnosis not present

## 2023-05-25 DIAGNOSIS — Z79899 Other long term (current) drug therapy: Secondary | ICD-10-CM | POA: Diagnosis not present

## 2023-05-25 DIAGNOSIS — M19171 Post-traumatic osteoarthritis, right ankle and foot: Secondary | ICD-10-CM | POA: Diagnosis not present

## 2023-05-25 DIAGNOSIS — S8291XD Unspecified fracture of right lower leg, subsequent encounter for closed fracture with routine healing: Secondary | ICD-10-CM | POA: Diagnosis not present

## 2023-05-25 DIAGNOSIS — F419 Anxiety disorder, unspecified: Secondary | ICD-10-CM | POA: Diagnosis not present

## 2023-05-25 DIAGNOSIS — M797 Fibromyalgia: Secondary | ICD-10-CM | POA: Diagnosis not present

## 2023-05-28 DIAGNOSIS — F411 Generalized anxiety disorder: Secondary | ICD-10-CM | POA: Diagnosis not present

## 2023-05-28 DIAGNOSIS — Z7952 Long term (current) use of systemic steroids: Secondary | ICD-10-CM | POA: Diagnosis not present

## 2023-05-28 DIAGNOSIS — M797 Fibromyalgia: Secondary | ICD-10-CM | POA: Diagnosis not present

## 2023-05-28 DIAGNOSIS — F331 Major depressive disorder, recurrent, moderate: Secondary | ICD-10-CM | POA: Diagnosis not present

## 2023-05-28 DIAGNOSIS — M19171 Post-traumatic osteoarthritis, right ankle and foot: Secondary | ICD-10-CM | POA: Diagnosis not present

## 2023-05-28 DIAGNOSIS — F419 Anxiety disorder, unspecified: Secondary | ICD-10-CM | POA: Diagnosis not present

## 2023-05-28 DIAGNOSIS — Z79899 Other long term (current) drug therapy: Secondary | ICD-10-CM | POA: Diagnosis not present

## 2023-05-28 DIAGNOSIS — S8291XD Unspecified fracture of right lower leg, subsequent encounter for closed fracture with routine healing: Secondary | ICD-10-CM | POA: Diagnosis not present

## 2023-05-28 DIAGNOSIS — F4002 Agoraphobia without panic disorder: Secondary | ICD-10-CM | POA: Diagnosis not present

## 2023-05-28 DIAGNOSIS — F32A Depression, unspecified: Secondary | ICD-10-CM | POA: Diagnosis not present

## 2023-05-31 ENCOUNTER — Telehealth: Payer: Self-pay

## 2023-05-31 DIAGNOSIS — F32A Depression, unspecified: Secondary | ICD-10-CM | POA: Diagnosis not present

## 2023-05-31 DIAGNOSIS — M797 Fibromyalgia: Secondary | ICD-10-CM | POA: Diagnosis not present

## 2023-05-31 DIAGNOSIS — Z79899 Other long term (current) drug therapy: Secondary | ICD-10-CM | POA: Diagnosis not present

## 2023-05-31 DIAGNOSIS — M19171 Post-traumatic osteoarthritis, right ankle and foot: Secondary | ICD-10-CM | POA: Diagnosis not present

## 2023-05-31 DIAGNOSIS — F419 Anxiety disorder, unspecified: Secondary | ICD-10-CM | POA: Diagnosis not present

## 2023-05-31 DIAGNOSIS — S8291XD Unspecified fracture of right lower leg, subsequent encounter for closed fracture with routine healing: Secondary | ICD-10-CM | POA: Diagnosis not present

## 2023-05-31 DIAGNOSIS — Z7952 Long term (current) use of systemic steroids: Secondary | ICD-10-CM | POA: Diagnosis not present

## 2023-05-31 NOTE — Telephone Encounter (Signed)
Return call to [facility] PT with [facility], stated pt has an elevated BP of 144/92 and she's asymptomatic.

## 2023-05-31 NOTE — Telephone Encounter (Signed)
[facility] is requesting a call back to  give a report    Name : Angela Mahoney [phone]

## 2023-06-01 DIAGNOSIS — F4381 Prolonged grief disorder: Secondary | ICD-10-CM | POA: Diagnosis not present

## 2023-06-02 ENCOUNTER — Encounter (INDEPENDENT_AMBULATORY_CARE_PROVIDER_SITE_OTHER): Payer: Self-pay | Admitting: Physician Assistant

## 2023-06-02 ENCOUNTER — Ambulatory Visit (INDEPENDENT_AMBULATORY_CARE_PROVIDER_SITE_OTHER): Payer: Medicare HMO | Admitting: Physician Assistant

## 2023-06-02 VITALS — BP 111/75 | HR 85 | Temp 98.5°F | Ht 64.0 in | Wt 236.0 lb

## 2023-06-02 DIAGNOSIS — I2510 Atherosclerotic heart disease of native coronary artery without angina pectoris: Secondary | ICD-10-CM

## 2023-06-02 DIAGNOSIS — M797 Fibromyalgia: Secondary | ICD-10-CM

## 2023-06-02 DIAGNOSIS — Z9884 Bariatric surgery status: Secondary | ICD-10-CM

## 2023-06-02 DIAGNOSIS — J449 Chronic obstructive pulmonary disease, unspecified: Secondary | ICD-10-CM

## 2023-06-02 DIAGNOSIS — E669 Obesity, unspecified: Secondary | ICD-10-CM

## 2023-06-02 DIAGNOSIS — Z6841 Body Mass Index (BMI) 40.0 and over, adult: Secondary | ICD-10-CM

## 2023-06-02 DIAGNOSIS — K219 Gastro-esophageal reflux disease without esophagitis: Secondary | ICD-10-CM | POA: Diagnosis not present

## 2023-06-02 NOTE — Progress Notes (Signed)
Office: [phone]  /  Fax: [phone]   Initial Visit  Angela Mahoney was seen in clinic today to evaluate for obesity. She is interested in losing weight to improve overall health and reduce the risk of weight related complications. She presents today to review program treatment options, initial physical assessment, and evaluation.     She was referred by: PCP [facility]  When asked what else they would like to accomplish? She states: Adopt healthier eating patterns, Improve energy levels and physical activity, Improve existing medical conditions, Reduce number of medications, Improve quality of life, Improve appearance, and Improve self-confidence  Weight history: Gained weight when initially trying to quit smoking in late 30's. Finally stopped smoking 7 years ago this October.   When asked how has your weight affected you? She states: Has affected self-esteem, Contributed to medical problems, Contributed to orthopedic problems or mobility issues, Having fatigue, Having poor endurance, Problems with eating patterns, and Has affected mood   Some associated conditions: Arthritis:Fibromyalgia, Prediabetes, GERD, Heart disease, Lung disease, and Other: Fibromyalgia  Contributing factors: Family history, Nutritional, Medications, Stress, Reduced physical activity, and Eating patterns  Weight promoting medications identified: Psychotropic medications  Current nutrition plan: None  Current level of physical activity: None and Other: Recovering from surgery and undergoing PT currently.   Current or previous pharmacotherapy: GLP-1 and Phentermine Was going to [facility] clinic for weight loss last year. Nadir weight 202 lbs  Response to medication: Lost weight initially but was unable to sustain weight loss and Was cost prohibitive or lost coverage for AOM.   Past medical history includes:   Past Medical History:  Diagnosis Date   Anemia    Anxiety     Arthritis    COPD (chronic obstructive pulmonary disease) (HCC) 2017   Coronary artery disease    [place], [place]   Depression    Dyspnea    with exertion   Falls    Fibromyalgia    GERD (gastroesophageal reflux disease)    Headache    History of fainting spells of unknown cause    ? r/t low blood pressure per daughter   Hypothyroidism    no longer treated   Myocardial infarction (HCC) 01/2016   widowmaker in [place]- had cardiac cath   Seasonal allergies    Sleep apnea    does not use cpap     Objective:   BP 111/75   Pulse 85   Temp 98.5 F (36.9 C)   Ht 5\' 4"  (1.626 m)   Wt 236 lb (107 kg)   SpO2 93%   BMI 40.51 kg/m  She was weighed on the bioimpedance scale: Body mass index is 40.51 kg/m.  Peak Weight:289 lbs , Body Fat%:53.1%, Visceral Fat Rating:17, Weight trend over the last 12 months: Increasing  General:  Alert, oriented and cooperative. Patient is in no acute distress.  Respiratory: Normal respiratory effort, no problems with respiration noted   Gait: able to ambulate independently  Mental Status: Normal mood and affect. Normal behavior. Normal judgment and thought content.   DIAGNOSTIC DATA REVIEWED:  BMET    Component Value Date/Time   NA 141 12/15/2021 0559   K 3.7 12/15/2021 0559   CL 108 12/15/2021 0559   CO2 25 12/15/2021 0559   GLUCOSE 89 12/15/2021 0559   BUN 9 12/15/2021 0559   CREATININE 1.02 (H) 12/15/2021 0559   CALCIUM 9.3 12/15/2021 0559   GFRNONAA >60 12/15/2021 0559   GFRAA >60 04/03/2020  2209   No results found for: "HGBA1C" No results found for: "INSULIN" CBC    Component Value Date/Time   WBC 4.2 12/15/2021 0559   RBC 4.71 12/15/2021 0559   HGB 13.4 12/15/2021 0559   HCT 43.0 12/15/2021 0559   PLT 182 12/15/2021 0559   MCV 91.3 12/15/2021 0559   MCH 28.5 12/15/2021 0559   MCHC 31.2 12/15/2021 0559   RDW 13.7 12/15/2021 0559   Iron/TIBC/Ferritin/ %Sat No results found for: "IRON", "TIBC", "FERRITIN",  "IRONPCTSAT" Lipid Panel  No results found for: "CHOL", "TRIG", "HDL", "CHOLHDL", "VLDL", "LDLCALC", "LDLDIRECT" Hepatic Function Panel     Component Value Date/Time   PROT 6.4 (L) 07/31/2021 0743   ALBUMIN 3.3 (L) 07/31/2021 0743   AST 29 07/31/2021 0743   ALT 26 07/31/2021 0743   ALKPHOS 83 07/31/2021 0743   BILITOT 0.8 07/31/2021 0743   No results found for: "TSH"   Assessment and Plan:   History of bariatric surgery  Coronary artery disease involving native coronary artery of native heart without angina pectoris  Chronic obstructive pulmonary disease, unspecified COPD type (HCC)  Gastroesophageal reflux disease, unspecified whether esophagitis present  Fibromyalgia        Obesity Treatment / Action Plan:  Patient will work on garnering support from family and friends to begin weight loss journey. Will work on eliminating or reducing the presence of highly palatable, calorie dense foods in the home. Will complete provided nutritional and psychosocial assessment questionnaire before the next appointment. Will be scheduled for indirect calorimetry to determine resting energy expenditure in a fasting state.  This will allow [place] to create a reduced calorie, high-protein meal plan to promote loss of fat mass while preserving muscle mass. Will work on reducing intake of added sugars, simple sugars and processed carbs. Will avoid skipping meals which may result in increased hunger signals and overeating at certain times. Counseled on the health benefits of losing 5%-15% of total body weight. Was counseled on nutritional approaches to weight loss and benefits of reducing processed foods and consuming plant-based foods and high quality protein as part of nutritional weight management. Was counseled on pharmacotherapy and role as an adjunct in weight management.   Obesity Education Performed Today:  She was weighed on the bioimpedance scale and results were discussed and  documented in the synopsis.  We discussed obesity as a disease and the importance of a more detailed evaluation of all the factors contributing to the disease.  We discussed the importance of long term lifestyle changes which include nutrition, exercise and behavioral modifications as well as the importance of customizing this to her specific health and social needs.  We discussed the benefits of reaching a healthier weight to alleviate the symptoms of existing conditions and reduce the risks of the biomechanical, metabolic and psychological effects of obesity.  [name] appears to be in the action stage of change and states they are ready to start intensive lifestyle modifications and behavioral modifications.  30 minutes was spent today on this visit including the above counseling, pre-visit chart review, and post-visit documentation.  Reviewed by clinician on day of visit: allergies, medications, problem list, medical history, surgical history, family history, social history, and previous encounter notes pertinent to obesity diagnosis.  Charmayne Odell,PA-C

## 2023-06-03 DIAGNOSIS — M19171 Post-traumatic osteoarthritis, right ankle and foot: Secondary | ICD-10-CM | POA: Diagnosis not present

## 2023-06-03 DIAGNOSIS — S8291XD Unspecified fracture of right lower leg, subsequent encounter for closed fracture with routine healing: Secondary | ICD-10-CM | POA: Diagnosis not present

## 2023-06-03 DIAGNOSIS — F32A Depression, unspecified: Secondary | ICD-10-CM | POA: Diagnosis not present

## 2023-06-03 DIAGNOSIS — F419 Anxiety disorder, unspecified: Secondary | ICD-10-CM | POA: Diagnosis not present

## 2023-06-03 DIAGNOSIS — Z79899 Other long term (current) drug therapy: Secondary | ICD-10-CM | POA: Diagnosis not present

## 2023-06-03 DIAGNOSIS — M797 Fibromyalgia: Secondary | ICD-10-CM | POA: Diagnosis not present

## 2023-06-03 DIAGNOSIS — Z7952 Long term (current) use of systemic steroids: Secondary | ICD-10-CM | POA: Diagnosis not present

## 2023-06-08 DIAGNOSIS — S8291XD Unspecified fracture of right lower leg, subsequent encounter for closed fracture with routine healing: Secondary | ICD-10-CM | POA: Diagnosis not present

## 2023-06-08 DIAGNOSIS — M19171 Post-traumatic osteoarthritis, right ankle and foot: Secondary | ICD-10-CM | POA: Diagnosis not present

## 2023-06-08 DIAGNOSIS — F419 Anxiety disorder, unspecified: Secondary | ICD-10-CM | POA: Diagnosis not present

## 2023-06-08 DIAGNOSIS — Z7952 Long term (current) use of systemic steroids: Secondary | ICD-10-CM | POA: Diagnosis not present

## 2023-06-08 DIAGNOSIS — Z79899 Other long term (current) drug therapy: Secondary | ICD-10-CM | POA: Diagnosis not present

## 2023-06-08 DIAGNOSIS — M797 Fibromyalgia: Secondary | ICD-10-CM | POA: Diagnosis not present

## 2023-06-08 DIAGNOSIS — F32A Depression, unspecified: Secondary | ICD-10-CM | POA: Diagnosis not present

## 2023-06-15 DIAGNOSIS — F4381 Prolonged grief disorder: Secondary | ICD-10-CM | POA: Diagnosis not present

## 2023-06-29 DIAGNOSIS — F4381 Prolonged grief disorder: Secondary | ICD-10-CM | POA: Diagnosis not present

## 2023-06-29 DIAGNOSIS — S8261XS Displaced fracture of lateral malleolus of right fibula, sequela: Secondary | ICD-10-CM | POA: Diagnosis not present

## 2023-06-29 DIAGNOSIS — R2689 Other abnormalities of gait and mobility: Secondary | ICD-10-CM | POA: Diagnosis not present

## 2023-06-29 DIAGNOSIS — M6281 Muscle weakness (generalized): Secondary | ICD-10-CM | POA: Diagnosis not present

## 2023-06-29 DIAGNOSIS — S8251XS Displaced fracture of medial malleolus of right tibia, sequela: Secondary | ICD-10-CM | POA: Diagnosis not present

## 2023-06-29 DIAGNOSIS — Z4789 Encounter for other orthopedic aftercare: Secondary | ICD-10-CM | POA: Diagnosis not present

## 2023-06-29 DIAGNOSIS — I10 Essential (primary) hypertension: Secondary | ICD-10-CM | POA: Diagnosis not present

## 2023-07-01 DIAGNOSIS — Z0289 Encounter for other administrative examinations: Secondary | ICD-10-CM

## 2023-07-02 DIAGNOSIS — R2689 Other abnormalities of gait and mobility: Secondary | ICD-10-CM | POA: Diagnosis not present

## 2023-07-02 DIAGNOSIS — S8251XS Displaced fracture of medial malleolus of right tibia, sequela: Secondary | ICD-10-CM | POA: Diagnosis not present

## 2023-07-02 DIAGNOSIS — Z4789 Encounter for other orthopedic aftercare: Secondary | ICD-10-CM | POA: Diagnosis not present

## 2023-07-02 DIAGNOSIS — M6281 Muscle weakness (generalized): Secondary | ICD-10-CM | POA: Diagnosis not present

## 2023-07-02 DIAGNOSIS — I10 Essential (primary) hypertension: Secondary | ICD-10-CM | POA: Diagnosis not present

## 2023-07-02 DIAGNOSIS — S8261XS Displaced fracture of lateral malleolus of right fibula, sequela: Secondary | ICD-10-CM | POA: Diagnosis not present

## 2023-07-05 DIAGNOSIS — S82851D Displaced trimalleolar fracture of right lower leg, subsequent encounter for closed fracture with routine healing: Secondary | ICD-10-CM | POA: Diagnosis not present

## 2023-07-07 DIAGNOSIS — Z4789 Encounter for other orthopedic aftercare: Secondary | ICD-10-CM | POA: Diagnosis not present

## 2023-07-07 DIAGNOSIS — R2689 Other abnormalities of gait and mobility: Secondary | ICD-10-CM | POA: Diagnosis not present

## 2023-07-07 DIAGNOSIS — S8251XS Displaced fracture of medial malleolus of right tibia, sequela: Secondary | ICD-10-CM | POA: Diagnosis not present

## 2023-07-07 DIAGNOSIS — M6281 Muscle weakness (generalized): Secondary | ICD-10-CM | POA: Diagnosis not present

## 2023-07-07 DIAGNOSIS — S8261XS Displaced fracture of lateral malleolus of right fibula, sequela: Secondary | ICD-10-CM | POA: Diagnosis not present

## 2023-07-07 DIAGNOSIS — I10 Essential (primary) hypertension: Secondary | ICD-10-CM | POA: Diagnosis not present

## 2023-07-09 DIAGNOSIS — M6281 Muscle weakness (generalized): Secondary | ICD-10-CM | POA: Diagnosis not present

## 2023-07-09 DIAGNOSIS — S8261XS Displaced fracture of lateral malleolus of right fibula, sequela: Secondary | ICD-10-CM | POA: Diagnosis not present

## 2023-07-09 DIAGNOSIS — Z4789 Encounter for other orthopedic aftercare: Secondary | ICD-10-CM | POA: Diagnosis not present

## 2023-07-09 DIAGNOSIS — R2689 Other abnormalities of gait and mobility: Secondary | ICD-10-CM | POA: Diagnosis not present

## 2023-07-09 DIAGNOSIS — S8251XS Displaced fracture of medial malleolus of right tibia, sequela: Secondary | ICD-10-CM | POA: Diagnosis not present

## 2023-07-09 DIAGNOSIS — I10 Essential (primary) hypertension: Secondary | ICD-10-CM | POA: Diagnosis not present

## 2023-07-13 DIAGNOSIS — F4381 Prolonged grief disorder: Secondary | ICD-10-CM | POA: Diagnosis not present

## 2023-07-14 DIAGNOSIS — R2689 Other abnormalities of gait and mobility: Secondary | ICD-10-CM | POA: Diagnosis not present

## 2023-07-14 DIAGNOSIS — M6281 Muscle weakness (generalized): Secondary | ICD-10-CM | POA: Diagnosis not present

## 2023-07-14 DIAGNOSIS — S8251XS Displaced fracture of medial malleolus of right tibia, sequela: Secondary | ICD-10-CM | POA: Diagnosis not present

## 2023-07-14 DIAGNOSIS — I10 Essential (primary) hypertension: Secondary | ICD-10-CM | POA: Diagnosis not present

## 2023-07-14 DIAGNOSIS — Z4789 Encounter for other orthopedic aftercare: Secondary | ICD-10-CM | POA: Diagnosis not present

## 2023-07-14 DIAGNOSIS — S8261XS Displaced fracture of lateral malleolus of right fibula, sequela: Secondary | ICD-10-CM | POA: Diagnosis not present

## 2023-07-16 DIAGNOSIS — S8251XS Displaced fracture of medial malleolus of right tibia, sequela: Secondary | ICD-10-CM | POA: Diagnosis not present

## 2023-07-16 DIAGNOSIS — S8261XS Displaced fracture of lateral malleolus of right fibula, sequela: Secondary | ICD-10-CM | POA: Diagnosis not present

## 2023-07-16 DIAGNOSIS — I10 Essential (primary) hypertension: Secondary | ICD-10-CM | POA: Diagnosis not present

## 2023-07-16 DIAGNOSIS — R2689 Other abnormalities of gait and mobility: Secondary | ICD-10-CM | POA: Diagnosis not present

## 2023-07-16 DIAGNOSIS — M6281 Muscle weakness (generalized): Secondary | ICD-10-CM | POA: Diagnosis not present

## 2023-07-16 DIAGNOSIS — Z4789 Encounter for other orthopedic aftercare: Secondary | ICD-10-CM | POA: Diagnosis not present

## 2023-07-20 ENCOUNTER — Telehealth (INDEPENDENT_AMBULATORY_CARE_PROVIDER_SITE_OTHER): Payer: Self-pay | Admitting: Family Medicine

## 2023-07-20 ENCOUNTER — Other Ambulatory Visit: Payer: Self-pay

## 2023-07-20 ENCOUNTER — Encounter (INDEPENDENT_AMBULATORY_CARE_PROVIDER_SITE_OTHER): Payer: Self-pay

## 2023-07-20 NOTE — Telephone Encounter (Signed)
I contacted the patient to reschedule the appointment with Dr. Sharee Holster on 08/25/2023 as she is currently out of the office. Sent mychart message as well

## 2023-07-21 DIAGNOSIS — I10 Essential (primary) hypertension: Secondary | ICD-10-CM | POA: Diagnosis not present

## 2023-07-21 DIAGNOSIS — M6281 Muscle weakness (generalized): Secondary | ICD-10-CM | POA: Diagnosis not present

## 2023-07-21 DIAGNOSIS — Z4789 Encounter for other orthopedic aftercare: Secondary | ICD-10-CM | POA: Diagnosis not present

## 2023-07-21 DIAGNOSIS — S8251XS Displaced fracture of medial malleolus of right tibia, sequela: Secondary | ICD-10-CM | POA: Diagnosis not present

## 2023-07-21 DIAGNOSIS — R2689 Other abnormalities of gait and mobility: Secondary | ICD-10-CM | POA: Diagnosis not present

## 2023-07-21 DIAGNOSIS — S8261XS Displaced fracture of lateral malleolus of right fibula, sequela: Secondary | ICD-10-CM | POA: Diagnosis not present

## 2023-07-21 MED ORDER — OXYBUTYNIN CHLORIDE 5 MG/5ML PO SOLN: 2.5000 mg | Freq: Every day | ORAL | 0 refills | Status: DC

## 2023-07-21 NOTE — Telephone Encounter (Signed)
I contacted the patient to reschedule the appointment with Dr. Sharee Holster on 08/25/2023 as she is currently out of the office. Left voicemail

## 2023-07-23 DIAGNOSIS — Z4789 Encounter for other orthopedic aftercare: Secondary | ICD-10-CM | POA: Diagnosis not present

## 2023-07-23 DIAGNOSIS — M6281 Muscle weakness (generalized): Secondary | ICD-10-CM | POA: Diagnosis not present

## 2023-07-23 DIAGNOSIS — S8261XS Displaced fracture of lateral malleolus of right fibula, sequela: Secondary | ICD-10-CM | POA: Diagnosis not present

## 2023-07-23 DIAGNOSIS — I10 Essential (primary) hypertension: Secondary | ICD-10-CM | POA: Diagnosis not present

## 2023-07-23 DIAGNOSIS — R2689 Other abnormalities of gait and mobility: Secondary | ICD-10-CM | POA: Diagnosis not present

## 2023-07-23 DIAGNOSIS — S8251XS Displaced fracture of medial malleolus of right tibia, sequela: Secondary | ICD-10-CM | POA: Diagnosis not present

## 2023-07-26 ENCOUNTER — Other Ambulatory Visit: Payer: Self-pay | Admitting: Student

## 2023-07-26 ENCOUNTER — Other Ambulatory Visit: Payer: Self-pay

## 2023-07-26 MED ORDER — OXYBUTYNIN CHLORIDE 5 MG PO TABS: 5.0000 mg | ORAL_TABLET | Freq: Every day | ORAL | 0 refills | Status: DC

## 2023-08-06 ENCOUNTER — Other Ambulatory Visit: Payer: Self-pay

## 2023-08-06 ENCOUNTER — Encounter: Payer: Self-pay | Admitting: Student

## 2023-08-06 ENCOUNTER — Ambulatory Visit: Payer: Medicare HMO | Admitting: Student

## 2023-08-06 VITALS — BP 112/79 | HR 88 | Temp 98.3°F | Resp 20 | Ht 64.0 in | Wt 246.5 lb

## 2023-08-06 DIAGNOSIS — N3946 Mixed incontinence: Secondary | ICD-10-CM | POA: Insufficient documentation

## 2023-08-06 DIAGNOSIS — N3941 Urge incontinence: Secondary | ICD-10-CM

## 2023-08-06 DIAGNOSIS — Z124 Encounter for screening for malignant neoplasm of cervix: Secondary | ICD-10-CM

## 2023-08-06 DIAGNOSIS — Z1239 Encounter for other screening for malignant neoplasm of breast: Secondary | ICD-10-CM | POA: Insufficient documentation

## 2023-08-06 DIAGNOSIS — Z1211 Encounter for screening for malignant neoplasm of colon: Secondary | ICD-10-CM | POA: Insufficient documentation

## 2023-08-06 DIAGNOSIS — Z1231 Encounter for screening mammogram for malignant neoplasm of breast: Secondary | ICD-10-CM

## 2023-08-06 DIAGNOSIS — Z23 Encounter for immunization: Secondary | ICD-10-CM

## 2023-08-06 NOTE — Assessment & Plan Note (Signed)
Patient reports that she needs to have the shingles vaccine. We do not carry the vaccine here and I recommended her go to her pharmacy to get it and she agreed.

## 2023-08-06 NOTE — Patient Instructions (Addendum)
Angela Mahoney you for allowing me to take part in your care today.  Here are your instructions.  1. Regarding your urinary incontinence please keep taking the oxybutynin. I want you to start having timed voiding. Put an alarm on for every two hours while you are awake to make sure that you go to void every 2 hours.  2. Regarding your flu shot, we gave that to you today  3. Please go to your pharmacy to get the shingles vaccine   4. I will update your chart with all of the paperwork you gave [place] today   5. Return in about 3 months and we can get all of your blood work.   Thank you, Dr. Allena Katz  If you have any other questions please contact the internal medicine clinic at [phone] If it is after hours, please call the [facility] at [phone] and then ask the person who picks up for the resident on call.

## 2023-08-06 NOTE — Assessment & Plan Note (Signed)
Administered flu vaccine today

## 2023-08-06 NOTE — Assessment & Plan Note (Signed)
Patient provided documentation for colon cancer screening. This was done via cologaurd in 10/14/2022 and it was negative. Patient will be due in the next 3 years.

## 2023-08-06 NOTE — Progress Notes (Signed)
CC: Wellness visit   HPI:  [name] is a 62 y.o. with a past medical history of CAD, COPD, GERD and fibromyalgia presents for 106-month follow-up. Please see assessment and plan for full HPI.   Past Medical History:  Diagnosis Date   Anemia    Anxiety    Arthritis    COPD (chronic obstructive pulmonary disease) (HCC) 2017   Coronary artery disease    [place]   Depression    Dyspnea    with exertion   Falls    Fibromyalgia    GERD (gastroesophageal reflux disease)    Headache    History of fainting spells of unknown cause    ? r/t low blood pressure per daughter   Hypothyroidism    no longer treated   Myocardial infarction (HCC) 01/2016   widowmaker in [place]- had cardiac cath   Seasonal allergies    Sleep apnea    does not use cpap     Current Outpatient Medications:    albuterol (VENTOLIN HFA) 108 (90 Base) MCG/ACT inhaler, Inhale 1-2 puffs into the lungs every 6 (six) hours as needed for shortness of breath or wheezing., Disp: , Rfl:    Ascorbic Acid (VITAMIN C-ROSE HIPS CR) 1500 MG TBCR, Take 1,500 mg by mouth daily., Disp: , Rfl:    atorvastatin (LIPITOR) 80 MG tablet, Take 80 mg by mouth daily with supper., Disp: , Rfl:    B Complex-Biotin-FA (B-COMPLEET-100) TABS, , Disp: , Rfl:    Baclofen 5 MG TABS, Take 5 mg by mouth at bedtime., Disp: , Rfl:    buPROPion (WELLBUTRIN XL) 150 MG 24 hr tablet, Take 1 tablet (150 mg total) by mouth every morning., Disp: 30 tablet, Rfl: 2   busPIRone (BUSPAR) 15 MG tablet, Take 15 mg by mouth 3 (three) times daily., Disp: , Rfl:    Calcium Citrate-Vitamin D (CALCIUM CITRATE + D PO), Take 1 tablet by mouth daily., Disp: , Rfl:    DULoxetine (CYMBALTA) 60 MG capsule, Take 2 capsules (120 mg total) by mouth at bedtime., Disp: , Rfl: 3   famotidine (PEPCID) 10 MG tablet, Take 10 mg by mouth daily with breakfast., Disp: , Rfl:    fludrocortisone (FLORINEF) 0.1 MG tablet, Take 0.2 mg by mouth daily., Disp: ,  Rfl:    fluticasone (FLONASE) 50 MCG/ACT nasal spray, Place 1 spray into both nostrils daily as needed for allergies or rhinitis., Disp: , Rfl:    gabapentin (NEURONTIN) 300 MG capsule, Take 600-900 mg by mouth See admin instructions. Take 3 capsules in the morning then 2 capsules dinner time and bedtime., Disp: , Rfl:    loratadine (CLARITIN) 10 MG tablet, Take by mouth., Disp: , Rfl:    Multiple Vitamin (MULTIVITAMIN WITH MINERALS) TABS tablet, Take 1 tablet by mouth daily., Disp: , Rfl:    ondansetron (ZOFRAN-ODT) 4 MG disintegrating tablet, Take 1 tablet by mouth every 8 (eight) hours as needed for nausea/vomiting., Disp: , Rfl:    oxybutynin (DITROPAN) 5 MG tablet, Take 1 tablet (5 mg total) by mouth daily., Disp: 30 tablet, Rfl: 0   topiramate (TOPAMAX) 50 MG tablet, Take 50 mg by mouth at bedtime. Prescribed for weight loss, Disp: , Rfl:   Review of Systems:    Negative except what is stated in the HPI.   Physical Exam:  Vitals:   08/06/23 1034  BP: 112/79  Pulse: 88  Resp: 20  Temp: 98.3 F (36.8 C)  TempSrc: Oral  SpO2: 96%  Weight: 246 lb 8 oz (111.8 kg)  Height: 5\' 4"  (1.626 m)   General: Patient is sitting comfortably in the room  Head: Normocephalic, atraumatic  Cardio: Regular rate and rhythm, no murmurs, rubs or gallops Pulmonary: Clear to ausculation bilaterally with no rales, rhonchi, and crackles    Assessment & Plan:   Flu vaccine need Administered flu vaccine today   Need for shingles vaccine Patient reports that she needs to have the shingles vaccine. We do not carry the vaccine here and I recommended her go to her pharmacy to get it and she agreed.   Screening for breast cancer Patient mammogram on 02/10/2023.  It was normal.  Documentation provided.  Plan: -Mammogram in 1 year  Colon cancer screening Patient provided documentation for colon cancer screening. This was done via cologaurd in 10/14/2022 and it was negative. Patient will be due in the  next 3 years.   Urge incontinence Patient reports that she has been dealing with urge incontinence for a while. She notes that she has been on oxybutynin for many months now and it has been helping but now she reports that it is not working anymore. She notes that she is having accidents on herself both 2 times a day. She states that she has to wear pads for this. She does report adherence to the is medication but she states that she still has accidents. She denies doing timed voiding.  Plan: -Counseled the patient on timed voiding  -Continue oxybutynin 5 mg daily  -If this does not work, in the next 3 months we can switch the patient to mirabegron   Patient discussed with Dr. Letta Kocher, DO PGY-2 Internal Medicine Resident  Pager: [phone]

## 2023-08-06 NOTE — Assessment & Plan Note (Signed)
Patient mammogram on 02/10/2023.  It was normal.  Documentation provided.  Plan: -Mammogram in 1 year

## 2023-08-06 NOTE — Assessment & Plan Note (Signed)
Patient reports that she has been dealing with urge incontinence for a while. She notes that she has been on oxybutynin for many months now and it has been helping but now she reports that it is not working anymore. She notes that she is having accidents on herself both 2 times a day. She states that she has to wear pads for this. She does report adherence to the is medication but she states that she still has accidents. She denies doing timed voiding.  Plan: -Counseled the patient on timed voiding  -Continue oxybutynin 5 mg daily  -If this does not work, in the next 3 months we can switch the patient to mirabegron

## 2023-08-09 DIAGNOSIS — R2689 Other abnormalities of gait and mobility: Secondary | ICD-10-CM | POA: Diagnosis not present

## 2023-08-09 DIAGNOSIS — M6281 Muscle weakness (generalized): Secondary | ICD-10-CM | POA: Diagnosis not present

## 2023-08-09 DIAGNOSIS — S8261XS Displaced fracture of lateral malleolus of right fibula, sequela: Secondary | ICD-10-CM | POA: Diagnosis not present

## 2023-08-09 DIAGNOSIS — I10 Essential (primary) hypertension: Secondary | ICD-10-CM | POA: Diagnosis not present

## 2023-08-09 DIAGNOSIS — S8251XS Displaced fracture of medial malleolus of right tibia, sequela: Secondary | ICD-10-CM | POA: Diagnosis not present

## 2023-08-09 DIAGNOSIS — Z4789 Encounter for other orthopedic aftercare: Secondary | ICD-10-CM | POA: Diagnosis not present

## 2023-08-09 NOTE — Progress Notes (Signed)
 Internal Medicine Clinic Attending  Case discussed with the resident physician at the time of the visit.  We reviewed the patient's history, exam, and pertinent patient test results.  I agree with the assessment, diagnosis, and plan of care documented in the resident's note.

## 2023-08-10 DIAGNOSIS — F4381 Prolonged grief disorder: Secondary | ICD-10-CM | POA: Diagnosis not present

## 2023-08-11 LAB — COLOGUARD

## 2023-08-11 LAB — HM PAP SMEAR: HPV, high-risk: NOT DETECTED

## 2023-08-12 DIAGNOSIS — R2689 Other abnormalities of gait and mobility: Secondary | ICD-10-CM | POA: Diagnosis not present

## 2023-08-12 DIAGNOSIS — S8261XS Displaced fracture of lateral malleolus of right fibula, sequela: Secondary | ICD-10-CM | POA: Diagnosis not present

## 2023-08-12 DIAGNOSIS — F331 Major depressive disorder, recurrent, moderate: Secondary | ICD-10-CM | POA: Diagnosis not present

## 2023-08-12 DIAGNOSIS — I10 Essential (primary) hypertension: Secondary | ICD-10-CM | POA: Diagnosis not present

## 2023-08-12 DIAGNOSIS — F411 Generalized anxiety disorder: Secondary | ICD-10-CM | POA: Diagnosis not present

## 2023-08-12 DIAGNOSIS — S8251XS Displaced fracture of medial malleolus of right tibia, sequela: Secondary | ICD-10-CM | POA: Diagnosis not present

## 2023-08-12 DIAGNOSIS — F4002 Agoraphobia without panic disorder: Secondary | ICD-10-CM | POA: Diagnosis not present

## 2023-08-12 DIAGNOSIS — M6281 Muscle weakness (generalized): Secondary | ICD-10-CM | POA: Diagnosis not present

## 2023-08-12 DIAGNOSIS — Z4789 Encounter for other orthopedic aftercare: Secondary | ICD-10-CM | POA: Diagnosis not present

## 2023-08-17 DIAGNOSIS — S8251XS Displaced fracture of medial malleolus of right tibia, sequela: Secondary | ICD-10-CM | POA: Diagnosis not present

## 2023-08-17 DIAGNOSIS — S8261XS Displaced fracture of lateral malleolus of right fibula, sequela: Secondary | ICD-10-CM | POA: Diagnosis not present

## 2023-08-17 DIAGNOSIS — I10 Essential (primary) hypertension: Secondary | ICD-10-CM | POA: Diagnosis not present

## 2023-08-17 DIAGNOSIS — M6281 Muscle weakness (generalized): Secondary | ICD-10-CM | POA: Diagnosis not present

## 2023-08-17 DIAGNOSIS — R2689 Other abnormalities of gait and mobility: Secondary | ICD-10-CM | POA: Diagnosis not present

## 2023-08-17 DIAGNOSIS — Z4789 Encounter for other orthopedic aftercare: Secondary | ICD-10-CM | POA: Diagnosis not present

## 2023-08-19 DIAGNOSIS — I10 Essential (primary) hypertension: Secondary | ICD-10-CM | POA: Diagnosis not present

## 2023-08-19 DIAGNOSIS — M6281 Muscle weakness (generalized): Secondary | ICD-10-CM | POA: Diagnosis not present

## 2023-08-19 DIAGNOSIS — S8261XS Displaced fracture of lateral malleolus of right fibula, sequela: Secondary | ICD-10-CM | POA: Diagnosis not present

## 2023-08-19 DIAGNOSIS — Z4789 Encounter for other orthopedic aftercare: Secondary | ICD-10-CM | POA: Diagnosis not present

## 2023-08-19 DIAGNOSIS — S8251XS Displaced fracture of medial malleolus of right tibia, sequela: Secondary | ICD-10-CM | POA: Diagnosis not present

## 2023-08-19 DIAGNOSIS — R2689 Other abnormalities of gait and mobility: Secondary | ICD-10-CM | POA: Diagnosis not present

## 2023-08-24 ENCOUNTER — Ambulatory Visit (INDEPENDENT_AMBULATORY_CARE_PROVIDER_SITE_OTHER): Payer: Medicare HMO | Admitting: Family Medicine

## 2023-08-24 DIAGNOSIS — F4381 Prolonged grief disorder: Secondary | ICD-10-CM | POA: Diagnosis not present

## 2023-08-25 ENCOUNTER — Other Ambulatory Visit: Payer: Self-pay | Admitting: Student

## 2023-08-25 ENCOUNTER — Ambulatory Visit (INDEPENDENT_AMBULATORY_CARE_PROVIDER_SITE_OTHER): Payer: Medicare HMO | Admitting: Family Medicine

## 2023-08-25 DIAGNOSIS — I10 Essential (primary) hypertension: Secondary | ICD-10-CM | POA: Diagnosis not present

## 2023-08-25 DIAGNOSIS — M6281 Muscle weakness (generalized): Secondary | ICD-10-CM | POA: Diagnosis not present

## 2023-08-25 DIAGNOSIS — S8251XS Displaced fracture of medial malleolus of right tibia, sequela: Secondary | ICD-10-CM | POA: Diagnosis not present

## 2023-08-25 DIAGNOSIS — Z4789 Encounter for other orthopedic aftercare: Secondary | ICD-10-CM | POA: Diagnosis not present

## 2023-08-25 DIAGNOSIS — S8261XS Displaced fracture of lateral malleolus of right fibula, sequela: Secondary | ICD-10-CM | POA: Diagnosis not present

## 2023-08-25 DIAGNOSIS — R2689 Other abnormalities of gait and mobility: Secondary | ICD-10-CM | POA: Diagnosis not present

## 2023-08-27 DIAGNOSIS — M6281 Muscle weakness (generalized): Secondary | ICD-10-CM | POA: Diagnosis not present

## 2023-08-27 DIAGNOSIS — I10 Essential (primary) hypertension: Secondary | ICD-10-CM | POA: Diagnosis not present

## 2023-08-27 DIAGNOSIS — R2689 Other abnormalities of gait and mobility: Secondary | ICD-10-CM | POA: Diagnosis not present

## 2023-08-27 DIAGNOSIS — S8251XS Displaced fracture of medial malleolus of right tibia, sequela: Secondary | ICD-10-CM | POA: Diagnosis not present

## 2023-08-27 DIAGNOSIS — Z4789 Encounter for other orthopedic aftercare: Secondary | ICD-10-CM | POA: Diagnosis not present

## 2023-08-27 DIAGNOSIS — S8261XS Displaced fracture of lateral malleolus of right fibula, sequela: Secondary | ICD-10-CM | POA: Diagnosis not present

## 2023-08-27 NOTE — Telephone Encounter (Signed)
Next appt scheduled 12/20 with Dr Merrilee Jansky.

## 2023-08-31 ENCOUNTER — Ambulatory Visit (INDEPENDENT_AMBULATORY_CARE_PROVIDER_SITE_OTHER): Payer: Medicare HMO | Admitting: Family Medicine

## 2023-08-31 ENCOUNTER — Encounter (INDEPENDENT_AMBULATORY_CARE_PROVIDER_SITE_OTHER): Payer: Self-pay | Admitting: Family Medicine

## 2023-08-31 VITALS — BP 101/77 | HR 86 | Temp 98.7°F | Ht 64.0 in | Wt 245.0 lb

## 2023-08-31 DIAGNOSIS — R0602 Shortness of breath: Secondary | ICD-10-CM | POA: Diagnosis not present

## 2023-08-31 DIAGNOSIS — F419 Anxiety disorder, unspecified: Secondary | ICD-10-CM | POA: Diagnosis not present

## 2023-08-31 DIAGNOSIS — F32A Depression, unspecified: Secondary | ICD-10-CM | POA: Diagnosis not present

## 2023-08-31 DIAGNOSIS — E559 Vitamin D deficiency, unspecified: Secondary | ICD-10-CM | POA: Diagnosis not present

## 2023-08-31 DIAGNOSIS — Z9884 Bariatric surgery status: Secondary | ICD-10-CM | POA: Diagnosis not present

## 2023-08-31 DIAGNOSIS — R739 Hyperglycemia, unspecified: Secondary | ICD-10-CM

## 2023-08-31 DIAGNOSIS — G4733 Obstructive sleep apnea (adult) (pediatric): Secondary | ICD-10-CM

## 2023-08-31 DIAGNOSIS — E7849 Other hyperlipidemia: Secondary | ICD-10-CM | POA: Diagnosis not present

## 2023-08-31 DIAGNOSIS — E669 Obesity, unspecified: Secondary | ICD-10-CM | POA: Diagnosis not present

## 2023-08-31 DIAGNOSIS — I2510 Atherosclerotic heart disease of native coronary artery without angina pectoris: Secondary | ICD-10-CM | POA: Diagnosis not present

## 2023-08-31 DIAGNOSIS — R5383 Other fatigue: Secondary | ICD-10-CM

## 2023-08-31 DIAGNOSIS — Z6841 Body Mass Index (BMI) 40.0 and over, adult: Secondary | ICD-10-CM

## 2023-08-31 NOTE — Progress Notes (Unsigned)
Chief Complaint:   OBESITY Angela Mahoney (MR# [id number]) is a 62 y.o. female who presents for evaluation and treatment of obesity and related comorbidities. Current BMI is Body mass index is 42.05 kg/m. Angela Mahoney has been struggling with her weight for many years and has been unsuccessful in either losing weight, maintaining weight loss, or reaching her healthy weight goal.  Angela Mahoney is currently in the action stage of change and ready to dedicate time achieving and maintaining a healthier weight. Angela Mahoney is interested in becoming our patient and working on intensive lifestyle modifications including (but not limited to) diet and exercise for weight loss.  Referred by new PCP.  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  Has significant history of hypotension being managed by endocrinology. Patient was previously on abilify.  Has a history of CAD. Significant restriction still from surgery. She was on Ozempic previously and got to below 200lbs and she was up to 0.5mg .  She mentions the provider she saw previously would no longer prescribe the medication for her. Lives at home with her husband Angela Mahoney who is supportive of her.  He and she eat meals together and he will be changing how he eats.  Desired weight is 175lbs.  Last time she was that weight was 20 years ago. She isn't eating out frequently and only sometimes likes to cook.  May skip meals.  She is a picky eater and voices she has texture issues which affects her with many vegetables.  Only greens she likes is green beans and likes carrots as well.  Does feel like she is out of control with her eating and food choices; particularly with premium mini saltine crackers.  Food Recall: 3 days out of the week she will wake up before her husband and will have coffee flavored premier protein and small container of orange yogurt.  Feels full until 2pm.  Then at that time she and her husband will cook a breakfast of 3 scrambled eggs  and jimmy dean box breakfast mixed together and then halved with 1 piece of bread.  Feels full and doesn't eat half eats only about 1 cup. Drinks water and 3 cups of coffee with a tablespoon of 2% milk in each cup.  Sometimes she doesn't eat again and other times she may eat strawberries in slices and then has cool whip dip strawberries in.  Phyllis's habits were reviewed today and are as follows: Her family eats meals together, she thinks her family will eat healthier with her, her desired weight loss is 70 lbs, she started gaining excessive weight at about 62 years old, her heaviest weight ever was 278 pounds, she is a picky eater and doesn't like to eat healthier foods, she has significant food cravings issues, she snacks frequently in the evenings, she skips meals frequently, she is frequently drinking liquids with calories, she frequently makes poor food choices, she frequently eats larger portions than normal, and she struggles with emotional eating.  Depression Screen Reighlyn's Food and Mood (modified PHQ-9) score was 14.  Subjective:   1. Other fatigue Geovana admits to daytime somnolence and denies waking up still tired. Patient has a history of symptoms of daytime fatigue. Mayci generally gets 7 hours of sleep per night, and states that she has generally restful sleep. Snoring is present. Apneic episodes are present. Epworth Sleepiness Score is 10.  EKG done on 01/01/2023; normal sinus rhythm.  Report was reviewed.  2. SOBOE (shortness of breath on  exertion) Anapaola notes increasing shortness of breath with exercising and seems to be worsening over time with weight gain. She notes getting out of breath sooner with activity than she used to. This has not gotten worse recently. Kaysa denies shortness of breath at rest or orthopnea.  3. Coronary artery disease involving native coronary artery of native heart without angina pectoris Patient had a cath done in 2017 showing LAD proximal 80 and  mid only 35% and RCA proximal 40% plaques.  She sees Cardiology.  4. OSA (obstructive sleep apnea) Patient previously used CPAP, but she does not need CPAP anymore.  5. History of bariatric surgery Patient is status post gastric sleeve in 2018, and she lost down to 202 pounds.  6. Other hyperlipidemia Patient is on statin daily, and her last LDL was at goal.  7. Vitamin D deficiency Patient's last vitamin D level was from 2022.  8. Hyperglycemia Patient has had elevated blood sugars in the past.  Previous A1c was within normal limits.  9. Anxiety and depression Patient is on a combination of medications, Wellbutrin, Cymbalta (for fibromyalgia), BuSpar, and previously on Abilify.  She denies suicidal or homicidal ideations.  She does have heightened emotions involving her weight.  Assessment/Plan:   1. Other fatigue Ceara does feel that her weight is causing her energy to be lower than it should be. Fatigue may be related to obesity, depression or many other causes. Labs will be ordered, and in the meanwhile, Raylee will focus on self care including making healthy food choices, increasing physical activity and focusing on stress reduction.  - TSH - T4, free - T3  2. SOBOE (shortness of breath on exertion) Delainee does feel that she gets out of breath more easily that she used to when she exercises. Nakima's shortness of breath appears to be obesity related and exercise induced. She has agreed to work on weight loss and gradually increase exercise to treat her exercise induced shortness of breath. Will continue to monitor closely.  - CBC with Differential/Platelet  3. Coronary artery disease involving native coronary artery of native heart without angina pectoris May consider Ozempic in the future.  4. OSA (obstructive sleep apnea) We will follow-up on patient's sleep study at her subsequent appointment.  5. History of bariatric surgery We will check labs today, we will  follow-up at patient's next appointment.  - Prealbumin  6. Other hyperlipidemia We will check labs today, we will follow-up at patient's next appointment.  - Lipid Panel With LDL/HDL Ratio  7. Vitamin D deficiency We will check labs today, we will follow-up at patient's next appointment.  - VITAMIN D 25 Hydroxy (Vit-D Deficiency, Fractures)  8. Hyperglycemia We will check labs today, we will follow-up at patient's next appointment.  - Comprehensive metabolic panel - Hemoglobin A1c - Insulin, random  9. Anxiety and depression We will follow-up on patient's symptoms at her next appointment.  10. Obesity with starting BMI of 42.1 Kwanza is currently in the action stage of change and her goal is to continue with weight loss efforts. I recommend Innocence begin the structured treatment plan as follows:  She has agreed to the Category 1 Plan.  Exercise goals: No exercise has been prescribed at this time.   Behavioral modification strategies: increasing lean protein intake, meal planning and cooking strategies, keeping healthy foods in the home, and planning for success.  She was informed of the importance of frequent follow-up visits to maximize her success with intensive lifestyle modifications for her  multiple health conditions. She was informed we would discuss her lab results at her next visit unless there is a critical issue that needs to be addressed sooner. Queena agreed to keep her next visit at the agreed upon time to discuss these results.  Objective:   Blood pressure 101/77, pulse 86, temperature 98.7 F (37.1 C), height 5\' 4"  (1.626 m), weight 245 lb (111.1 kg), SpO2 94%. Body mass index is 42.05 kg/m.  EKG: Normal sinus rhythm, rate 91 BPM.  Indirect Calorimeter completed today shows a VO2 of 173 and a REE of 1195.  Her calculated basal metabolic rate is 1610 thus her basal metabolic rate is worse than expected.  General: Cooperative, alert, well developed, in no  acute distress. HEENT: Conjunctivae and lids unremarkable. Cardiovascular: Regular rhythm.  Lungs: Normal work of breathing. Neurologic: No focal deficits.   Lab Results  Component Value Date   CREATININE 1.02 (H) 12/15/2021   BUN 9 12/15/2021   NA 141 12/15/2021   K 3.7 12/15/2021   CL 108 12/15/2021   CO2 25 12/15/2021   Lab Results  Component Value Date   ALT 26 07/31/2021   AST 29 07/31/2021   ALKPHOS 83 07/31/2021   BILITOT 0.8 07/31/2021   No results found for: "HGBA1C" No results found for: "INSULIN" No results found for: "TSH" No results found for: "CHOL", "HDL", "LDLCALC", "LDLDIRECT", "TRIG", "CHOLHDL" Lab Results  Component Value Date   WBC 4.2 12/15/2021   HGB 13.4 12/15/2021   HCT 43.0 12/15/2021   MCV 91.3 12/15/2021   PLT 182 12/15/2021   No results found for: "IRON", "TIBC", "FERRITIN"  Attestation Statements:   Reviewed by clinician on day of visit: allergies, medications, problem list, medical history, surgical history, family history, social history, and previous encounter notes.  Time spent on visit including pre-visit chart review and post-visit charting and care was 45 minutes.   I, Burt Knack, am acting as transcriptionist for Reuben Likes, MD. This is the patient's first visit at [facility]. The patient's NEW PATIENT PACKET was reviewed at length. Included in the packet: current and past health history, medications, allergies, ROS, gynecologic history (women only), surgical history, family history, social history, weight history, weight loss surgery history (for those that have had weight loss surgery), nutritional evaluation, mood and food questionnaire, PHQ9, Epworth questionnaire, sleep habits questionnaire, patient life and health improvement goals questionnaire. These will all be scanned into the patient's chart under media.   During the visit, I independently reviewed the patient's EKG, bioimpedance scale results, and  indirect calorimeter results. I used this information to tailor a meal plan for the patient that will help her to lose weight and will improve her obesity-related conditions going forward. I performed a medically necessary appropriate examination and/or evaluation. I discussed the assessment and treatment plan with the patient. The patient was provided an opportunity to ask questions and all were answered. The patient agreed with the plan and demonstrated an understanding of the instructions. Labs were ordered at this visit and will be reviewed at the next visit unless more critical results need to be addressed immediately. Clinical information was updated and documented in the EMR.    I have reviewed the above documentation for accuracy and completeness, and I agree with the above. - Reuben Likes, MD

## 2023-09-01 DIAGNOSIS — I10 Essential (primary) hypertension: Secondary | ICD-10-CM | POA: Diagnosis not present

## 2023-09-01 DIAGNOSIS — Z4789 Encounter for other orthopedic aftercare: Secondary | ICD-10-CM | POA: Diagnosis not present

## 2023-09-01 DIAGNOSIS — R2689 Other abnormalities of gait and mobility: Secondary | ICD-10-CM | POA: Diagnosis not present

## 2023-09-01 DIAGNOSIS — M6281 Muscle weakness (generalized): Secondary | ICD-10-CM | POA: Diagnosis not present

## 2023-09-01 DIAGNOSIS — S8261XS Displaced fracture of lateral malleolus of right fibula, sequela: Secondary | ICD-10-CM | POA: Diagnosis not present

## 2023-09-01 DIAGNOSIS — S8251XS Displaced fracture of medial malleolus of right tibia, sequela: Secondary | ICD-10-CM | POA: Diagnosis not present

## 2023-09-02 LAB — COMPREHENSIVE METABOLIC PANEL
ALT: 22 [IU]/L (ref 0–32)
AST: 26 [IU]/L (ref 0–40)
Albumin: 4.3 g/dL (ref 3.9–4.9)
Alkaline Phosphatase: 84 [IU]/L (ref 44–121)
BUN/Creatinine Ratio: 11 — ABNORMAL LOW (ref 12–28)
BUN: 11 mg/dL (ref 8–27)
Bilirubin Total: 0.3 mg/dL (ref 0.0–1.2)
CO2: 24 mmol/L (ref 20–29)
Calcium: 10 mg/dL (ref 8.7–10.3)
Chloride: 105 mmol/L (ref 96–106)
Creatinine, Ser: 0.96 mg/dL (ref 0.57–1.00)
Globulin, Total: 2.3 g/dL (ref 1.5–4.5)
Glucose: 86 mg/dL (ref 70–99)
Potassium: 4.2 mmol/L (ref 3.5–5.2)
Sodium: 144 mmol/L (ref 134–144)
Total Protein: 6.6 g/dL (ref 6.0–8.5)
eGFR: 66 mL/min/{1.73_m2} (ref >59)

## 2023-09-02 LAB — CBC WITH DIFFERENTIAL/PLATELET
Basophils Absolute: 0.1 10*3/uL (ref 0.0–0.2)
Basos: 2 %
EOS (ABSOLUTE): 0.2 10*3/uL (ref 0.0–0.4)
Eos: 4 %
Hematocrit: 42 % (ref 34.0–46.6)
Hemoglobin: 13.4 g/dL (ref 11.1–15.9)
Immature Grans (Abs): 0 10*3/uL (ref 0.0–0.1)
Immature Granulocytes: 0 %
Lymphocytes Absolute: 1.3 10*3/uL (ref 0.7–3.1)
Lymphs: 27 %
MCH: 28.7 pg (ref 26.6–33.0)
MCHC: 31.9 g/dL (ref 31.5–35.7)
MCV: 90 fL (ref 79–97)
Monocytes Absolute: 0.6 10*3/uL (ref 0.1–0.9)
Monocytes: 13 %
Neutrophils Absolute: 2.8 10*3/uL (ref 1.4–7.0)
Neutrophils: 54 %
Platelets: 182 10*3/uL (ref 150–450)
RBC: 4.67 x10E6/uL (ref 3.77–5.28)
RDW: 13.3 % (ref 11.7–15.4)
WBC: 5.1 10*3/uL (ref 3.4–10.8)

## 2023-09-02 LAB — T3: T3, Total: 94 ng/dL (ref 71–180)

## 2023-09-02 LAB — HEMOGLOBIN A1C
Est. average glucose Bld gHb Est-mCnc: 123 mg/dL
Hgb A1c MFr Bld: 5.9 % — ABNORMAL HIGH (ref 4.8–5.6)

## 2023-09-02 LAB — LIPID PANEL WITH LDL/HDL RATIO
Cholesterol, Total: 167 mg/dL (ref 100–199)
HDL: 75 mg/dL (ref >39)
LDL Chol Calc (NIH): 72 mg/dL (ref 0–99)
LDL/HDL Ratio: 1 {ratio} (ref 0.0–3.2)
Triglycerides: 117 mg/dL (ref 0–149)
VLDL Cholesterol Cal: 20 mg/dL (ref 5–40)

## 2023-09-02 LAB — PREALBUMIN: PREALBUMIN: 24 mg/dL (ref 10–36)

## 2023-09-02 LAB — TSH: TSH: 1.94 u[IU]/mL (ref 0.450–4.500)

## 2023-09-02 LAB — INSULIN, RANDOM: INSULIN: 7.9 u[IU]/mL (ref 2.6–24.9)

## 2023-09-02 LAB — VITAMIN D 25 HYDROXY (VIT D DEFICIENCY, FRACTURES): Vit D, 25-Hydroxy: 43.4 ng/mL (ref 30.0–100.0)

## 2023-09-02 LAB — T4, FREE: Free T4: 0.75 ng/dL — ABNORMAL LOW (ref 0.82–1.77)

## 2023-09-03 DIAGNOSIS — S8251XS Displaced fracture of medial malleolus of right tibia, sequela: Secondary | ICD-10-CM | POA: Diagnosis not present

## 2023-09-03 DIAGNOSIS — Z4789 Encounter for other orthopedic aftercare: Secondary | ICD-10-CM | POA: Diagnosis not present

## 2023-09-03 DIAGNOSIS — R2689 Other abnormalities of gait and mobility: Secondary | ICD-10-CM | POA: Diagnosis not present

## 2023-09-03 DIAGNOSIS — I10 Essential (primary) hypertension: Secondary | ICD-10-CM | POA: Diagnosis not present

## 2023-09-03 DIAGNOSIS — M6281 Muscle weakness (generalized): Secondary | ICD-10-CM | POA: Diagnosis not present

## 2023-09-03 DIAGNOSIS — S8261XS Displaced fracture of lateral malleolus of right fibula, sequela: Secondary | ICD-10-CM | POA: Diagnosis not present

## 2023-09-07 DIAGNOSIS — F4381 Prolonged grief disorder: Secondary | ICD-10-CM | POA: Diagnosis not present

## 2023-09-07 DIAGNOSIS — F320 Major depressive disorder, single episode, mild: Secondary | ICD-10-CM | POA: Diagnosis not present

## 2023-09-07 DIAGNOSIS — S82851D Displaced trimalleolar fracture of right lower leg, subsequent encounter for closed fracture with routine healing: Secondary | ICD-10-CM | POA: Diagnosis not present

## 2023-09-08 ENCOUNTER — Ambulatory Visit (INDEPENDENT_AMBULATORY_CARE_PROVIDER_SITE_OTHER): Payer: Medicare HMO | Admitting: Family Medicine

## 2023-09-08 DIAGNOSIS — R2689 Other abnormalities of gait and mobility: Secondary | ICD-10-CM | POA: Diagnosis not present

## 2023-09-08 DIAGNOSIS — Z4789 Encounter for other orthopedic aftercare: Secondary | ICD-10-CM | POA: Diagnosis not present

## 2023-09-08 DIAGNOSIS — S8261XS Displaced fracture of lateral malleolus of right fibula, sequela: Secondary | ICD-10-CM | POA: Diagnosis not present

## 2023-09-08 DIAGNOSIS — I10 Essential (primary) hypertension: Secondary | ICD-10-CM | POA: Diagnosis not present

## 2023-09-08 DIAGNOSIS — M6281 Muscle weakness (generalized): Secondary | ICD-10-CM | POA: Diagnosis not present

## 2023-09-08 DIAGNOSIS — S8251XS Displaced fracture of medial malleolus of right tibia, sequela: Secondary | ICD-10-CM | POA: Diagnosis not present

## 2023-09-14 ENCOUNTER — Ambulatory Visit (INDEPENDENT_AMBULATORY_CARE_PROVIDER_SITE_OTHER): Payer: Medicare HMO | Admitting: Family Medicine

## 2023-09-14 ENCOUNTER — Encounter (INDEPENDENT_AMBULATORY_CARE_PROVIDER_SITE_OTHER): Payer: Self-pay | Admitting: Family Medicine

## 2023-09-14 VITALS — BP 116/79 | HR 80 | Temp 98.3°F | Ht 64.0 in | Wt 246.0 lb

## 2023-09-14 DIAGNOSIS — F419 Anxiety disorder, unspecified: Secondary | ICD-10-CM | POA: Diagnosis not present

## 2023-09-14 DIAGNOSIS — F32A Depression, unspecified: Secondary | ICD-10-CM | POA: Diagnosis not present

## 2023-09-14 DIAGNOSIS — E7849 Other hyperlipidemia: Secondary | ICD-10-CM

## 2023-09-14 DIAGNOSIS — Z6841 Body Mass Index (BMI) 40.0 and over, adult: Secondary | ICD-10-CM

## 2023-09-14 DIAGNOSIS — E669 Obesity, unspecified: Secondary | ICD-10-CM

## 2023-09-14 DIAGNOSIS — R7303 Prediabetes: Secondary | ICD-10-CM | POA: Diagnosis not present

## 2023-09-14 DIAGNOSIS — E559 Vitamin D deficiency, unspecified: Secondary | ICD-10-CM

## 2023-09-14 NOTE — Progress Notes (Unsigned)
Chief Complaint:   OBESITY Angela Mahoney is here to discuss her progress with her obesity treatment plan along with follow-up of her obesity related diagnoses. Angela Mahoney is on the Category 1 Plan and states she is following her eating plan approximately 50% of the time. Angela Mahoney states she is doing 0 minutes 0 times per week.  Today's visit was #: 2 Starting weight: 245 lbs Starting date: 08/31/2023 Today's weight: 246 lbs Today's date: 09/14/2023 Total lbs lost to date: 0 Total lbs lost since last in-office visit: 0  Interim History: Patient voices she hasn't been doing the best over the last few weeks. She hasn't gotten her check yet to more fully commit to the plan and buy the products on plan.  She mentions she tried to eat more consistently the nutritious food she had available.  She doesn't have much issue in the am as she is doing her cafe latte shake and yoplait yogurt in the am. She is anticipating her financial situation changing in a couple days and she wants to work on incorporating more nutritious food more consistently. No anticipated travel or events for the next few weeks.  Subjective:   1. Vitamin D deficiency Patient has a new diagnosis.  She is on multivitamins and calcium D combination.  She denies nausea, vomiting, or muscle weakness but notes fatigue.  Her recent vitamin D level was of 43.4.  I discussed labs with the patient today.  2. Prediabetes Patient's recent A1c was 5.9 and insulin 7.9.  She is not on medications.  She recognizes that she has perhaps had a higher concentration of carbohydrates than she can handle.  I discussed labs with the patient today.  3. Other hyperlipidemia Patient is on Lipitor daily.  Her recent HDL was 75, LDL 72, and triglycerides 401.  I discussed labs with the patient today.  4. Anxiety and depression Patient is on combination of medications; Wellbutrin and Cymbalta.  She denies suicidal or homicidal ideations.  Assessment/Plan:    1. Vitamin D deficiency Patient will continue OTC supplementation and add additional OTC vitamin D 2000 IU daily.  2. Prediabetes Pathophysiology of progression through insulin resistance to prediabetes and diabetes was discussed at length today.  Patient to continue to monitor and be in control of total intake of snack calories which may be simple carbohydrates but should be consumed only after the patient has taken in all the nutrition for the day.  Macronutrient identification, classification and daily intake ratios were discussed.  Plan to repeat labs in 3 months to monitor both hemoglobin A1c and insulin levels.  No medications at this time as patient is not having significant hunger or cravings that would make following meal plan more difficult.    3. Other hyperlipidemia Patient will continue Lipitor daily with no change in treatment.  4. Anxiety and depression We will follow-up on patient's symptom management at her next appointment.  5. BMI 40.0-44.9, adult (HCC)  6. Obesity with starting BMI of 42.1 Angela Mahoney is currently in the action stage of change. As such, her goal is to continue with weight loss efforts. She has agreed to the Category 1 Plan.   Exercise goals: All adults should avoid inactivity. Some physical activity is better than none, and adults who participate in any amount of physical activity gain some health benefits.  Behavioral modification strategies: increasing lean protein intake, meal planning and cooking strategies, keeping healthy foods in the home, and planning for success.  Angela Mahoney has agreed to  follow-up with our clinic in 2 to 3 weeks. She was informed of the importance of frequent follow-up visits to maximize her success with intensive lifestyle modifications for her multiple health conditions.   Objective:   Blood pressure 116/79, pulse 80, temperature 98.3 F (36.8 C), height 5\' 4"  (1.626 m), weight 246 lb (111.6 kg), SpO2 94%. Body mass index is  42.23 kg/m.  General: Cooperative, alert, well developed, in no acute distress. HEENT: Conjunctivae and lids unremarkable. Cardiovascular: Regular rhythm.  Lungs: Normal work of breathing. Neurologic: No focal deficits.   Lab Results  Component Value Date   CREATININE 0.96 08/31/2023   BUN 11 08/31/2023   NA 144 08/31/2023   K 4.2 08/31/2023   CL 105 08/31/2023   CO2 24 08/31/2023   Lab Results  Component Value Date   ALT 22 08/31/2023   AST 26 08/31/2023   ALKPHOS 84 08/31/2023   BILITOT 0.3 08/31/2023   Lab Results  Component Value Date   HGBA1C 5.9 (H) 08/31/2023   Lab Results  Component Value Date   INSULIN 7.9 08/31/2023   Lab Results  Component Value Date   TSH 1.940 08/31/2023   Lab Results  Component Value Date   CHOL 167 08/31/2023   HDL 75 08/31/2023   LDLCALC 72 08/31/2023   TRIG 117 08/31/2023   Lab Results  Component Value Date   VD25OH 43.4 08/31/2023   VD25OH 46.62 07/31/2021   Lab Results  Component Value Date   WBC 5.1 08/31/2023   HGB 13.4 08/31/2023   HCT 42.0 08/31/2023   MCV 90 08/31/2023   PLT 182 08/31/2023   No results found for: "IRON", "TIBC", "FERRITIN"  Attestation Statements:   Reviewed by clinician on day of visit: allergies, medications, problem list, medical history, surgical history, family history, social history, and previous encounter notes.  Time spent on visit including pre-visit chart review and post-visit care and charting was 42 minutes.   I, Burt Knack, am acting as transcriptionist for Reuben Likes, MD.  I have reviewed the above documentation for accuracy and completeness, and I agree with the above. - Reuben Likes, MD

## 2023-09-15 ENCOUNTER — Other Ambulatory Visit: Payer: Self-pay

## 2023-09-15 DIAGNOSIS — E782 Mixed hyperlipidemia: Secondary | ICD-10-CM

## 2023-09-15 DIAGNOSIS — E7849 Other hyperlipidemia: Secondary | ICD-10-CM

## 2023-09-15 MED ORDER — ATORVASTATIN CALCIUM 80 MG PO TABS
80.0000 mg | ORAL_TABLET | Freq: Every day | ORAL | 1 refills | Status: DC
Start: 2023-09-15 — End: 2024-03-07

## 2023-09-15 NOTE — Telephone Encounter (Signed)
Next appt scheduled 12/20 with Dr Merrilee Jansky.

## 2023-09-16 DIAGNOSIS — Z4789 Encounter for other orthopedic aftercare: Secondary | ICD-10-CM | POA: Diagnosis not present

## 2023-09-16 DIAGNOSIS — M6281 Muscle weakness (generalized): Secondary | ICD-10-CM | POA: Diagnosis not present

## 2023-09-16 DIAGNOSIS — S8251XS Displaced fracture of medial malleolus of right tibia, sequela: Secondary | ICD-10-CM | POA: Diagnosis not present

## 2023-09-16 DIAGNOSIS — R2689 Other abnormalities of gait and mobility: Secondary | ICD-10-CM | POA: Diagnosis not present

## 2023-09-16 DIAGNOSIS — I10 Essential (primary) hypertension: Secondary | ICD-10-CM | POA: Diagnosis not present

## 2023-09-16 DIAGNOSIS — S8261XS Displaced fracture of lateral malleolus of right fibula, sequela: Secondary | ICD-10-CM | POA: Diagnosis not present

## 2023-09-21 DIAGNOSIS — F320 Major depressive disorder, single episode, mild: Secondary | ICD-10-CM | POA: Diagnosis not present

## 2023-09-21 DIAGNOSIS — F4381 Prolonged grief disorder: Secondary | ICD-10-CM | POA: Diagnosis not present

## 2023-09-21 DIAGNOSIS — I959 Hypotension, unspecified: Secondary | ICD-10-CM | POA: Diagnosis not present

## 2023-09-22 ENCOUNTER — Telehealth: Payer: Self-pay | Admitting: Student

## 2023-09-22 ENCOUNTER — Ambulatory Visit: Payer: Medicare HMO

## 2023-09-22 VITALS — Ht 64.0 in | Wt 246.0 lb

## 2023-09-22 DIAGNOSIS — Z0000 Encounter for general adult medical examination without abnormal findings: Secondary | ICD-10-CM

## 2023-09-22 NOTE — Telephone Encounter (Signed)
Baclofen 5 MG TABS    [facility] Located in: [facility] Address: [address] [place] [address], [place], [place] [address] Phone: [phone]

## 2023-09-22 NOTE — Progress Notes (Signed)
Subjective:   Angela Mahoney is a 62 y.o. female who presents for Medicare Annual (Subsequent) preventive examination.  Visit Complete: Virtual I connected with  [name] on 09/22/23 by a audio enabled telemedicine application and verified that I am speaking with the correct person using two identifiers.  Patient Location: Home  Provider Location: Home Office  I discussed the limitations of evaluation and management by telemedicine. The patient expressed understanding and agreed to proceed.  Vital Signs: Because this visit was a virtual/telehealth visit, some criteria may be missing or patient reported. Any vitals not documented were not able to be obtained and vitals that have been documented are patient reported.   Cardiac Risk Factors include: advanced age (>88men, >78 women);obesity (BMI >30kg/m2);Other (see comment), Risk factor comments: CAD, COPD, Fibromylgia     Objective:    Today's Vitals   09/22/23 0820 09/22/23 0822  Weight: 246 lb (111.6 kg)   Height: 5\' 4"  (1.626 m)   PainSc:  6    Body mass index is 42.23 kg/m.     09/22/2023    8:27 AM 08/06/2023   10:27 AM 04/07/2023   11:31 AM 12/15/2021    6:29 AM 07/31/2021    7:10 AM 04/24/2020    9:16 AM 04/03/2020    9:59 PM  Advanced Directives  Does Patient Have a Medical Advance Directive? No No No No No No No  Would patient like information on creating a medical advance directive?  No - Patient declined Yes (MAU/Ambulatory/Procedural Areas - Information given) No - Patient declined No - Patient declined  No - Patient declined    Current Medications (verified) Outpatient Encounter Medications as of 09/22/2023  Medication Sig   albuterol (VENTOLIN HFA) 108 (90 Base) MCG/ACT inhaler Inhale 1-2 puffs into the lungs every 6 (six) hours as needed for shortness of breath or wheezing.   Ascorbic Acid (VITAMIN C-ROSE HIPS CR) 1500 MG TBCR Take 1,500 mg by mouth daily.   atorvastatin (LIPITOR) 80 MG  tablet Take 1 tablet (80 mg total) by mouth daily with supper.   B Complex-Biotin-FA (B-COMPLEET-100) TABS    Baclofen 5 MG TABS Take 5 mg by mouth at bedtime.   buPROPion (WELLBUTRIN XL) 150 MG 24 hr tablet Take 1 tablet (150 mg total) by mouth every morning.   busPIRone (BUSPAR) 15 MG tablet Take 15 mg by mouth 3 (three) times daily.   Calcium Citrate-Vitamin D (CALCIUM CITRATE + D PO) Take 1 tablet by mouth daily.   DULoxetine (CYMBALTA) 60 MG capsule Take 2 capsules (120 mg total) by mouth at bedtime.   famotidine (PEPCID) 10 MG tablet Take 10 mg by mouth daily with breakfast.   fludrocortisone (FLORINEF) 0.1 MG tablet Take 0.2 mg by mouth daily.   fluticasone (FLONASE) 50 MCG/ACT nasal spray Place 1 spray into both nostrils daily as needed for allergies or rhinitis.   gabapentin (NEURONTIN) 300 MG capsule Take 600-900 mg by mouth See admin instructions. Take 3 capsules in the morning then 2 capsules dinner time and 2 @ bedtime.   loratadine (CLARITIN) 10 MG tablet Take by mouth.   Multiple Vitamin (MULTIVITAMIN WITH MINERALS) TABS tablet Take 1 tablet by mouth daily.   ondansetron (ZOFRAN-ODT) 4 MG disintegrating tablet Take 1 tablet by mouth every 8 (eight) hours as needed for nausea/vomiting.   oxybutynin (DITROPAN) 5 MG tablet TAKE ONE TABLET BY MOUTH ONCE A DAY   topiramate (TOPAMAX) 50 MG tablet Take 50 mg by mouth at bedtime.  Prescribed for weight loss   No facility-administered encounter medications on file as of 09/22/2023.    Allergies (verified) Zolpidem   History: Past Medical History:  Diagnosis Date   Anemia    Anxiety    Arthritis    Back pain    Bilateral swelling of feet    Chest pain    Chronic fatigue syndrome    COPD (chronic obstructive pulmonary disease) (HCC) 2017   Coronary artery disease    Roxanne Mins, [place]   Depression    Dyspnea    with exertion   Falls    Fibromyalgia    GERD (gastroesophageal reflux disease)    Headache    High cholesterol     History of fainting spells of unknown cause    ? r/t low blood pressure per daughter   Hypothyroidism    no longer treated   Joint pain    Myocardial infarction (HCC) 01/2016   widowmaker in [place]- had cardiac cath   Rheumatoid arthritis (HCC)    Seasonal allergies    Sleep apnea    does not use cpap   SOB (shortness of breath)    Vitamin D deficiency    Past Surgical History:  Procedure Laterality Date   BUNIONECTOMY Right 2002   CARDIAC CATHETERIZATION  01/23/2016   In [place]   COLONOSCOPY     x 2 at ages 64yr and 54yr - [place]   EXTERNAL FIXATION REMOVAL Right 08/06/2021   Procedure: REMOVAL EXTERNAL FIXATION LEG;  Surgeon: Roby Lofts, MD;  Location: [facility] OR;  Service: Orthopedics;  Laterality: Right;  zimmer biomet ex-fix removal   LAPAROSCOPIC GASTRIC SLEEVE RESECTION  2017   [place]   MULTIPLE TOOTH EXTRACTIONS     has upper and lower dentures   ORIF ANKLE FRACTURE Right 08/06/2021   Procedure: OPEN REDUCTION INTERNAL FIXATION (ORIF) ANKLE FRACTURE;  Surgeon: Roby Lofts, MD;  Location: [facility] OR;  Service: Orthopedics;  Laterality: Right;  smith and nephew   SYNDESMOSIS REPAIR Right 12/15/2021   Procedure: SYNDESMOSIS REPAIR;  Surgeon: Roby Lofts, MD;  Location: [facility] OR;  Service: Orthopedics;  Laterality: Right;   Family History  Problem Relation Age of Onset   Cancer Mother    Heart disease Mother    Hyperlipidemia Mother    Alcoholism Mother    Heart disease Brother    Social History   Socioeconomic History   Marital status: Married    Spouse name: Dorene Sorrow   Number of children: 1   Years of education: Not on file   Highest education level: Not on file  Occupational History   Occupation: Disabled  Tobacco Use   Smoking status: Former    Current packs/day: 0.00    Average packs/day: 0.5 packs/day for 45.0 years (22.5 ttl pk-yrs)    Types: Cigarettes    Start date: 64    Quit date: 2014    Years since quitting: 10.8   Smokeless tobacco:  Never  Vaping Use   Vaping status: Never Used  Substance and Sexual Activity   Alcohol use: Never   Drug use: Never   Sexual activity: Not Currently  Other Topics Concern   Not on file  Social History Narrative   Lives with her husband. Has a dog   Social Determinants of Health   Financial Resource Strain: High Risk (09/22/2023)   Overall Financial Resource Strain (CARDIA)    Difficulty of Paying Living Expenses: Very hard  Food Insecurity: Food Insecurity Present (09/22/2023)  Hunger Vital Sign    Worried About Running Out of Food in the Last Year: Often true    Ran Out of Food in the Last Year: Sometimes true  Transportation Needs: No Transportation Needs (09/22/2023)   PRAPARE - Administrator, Civil Service (Medical): No    Lack of Transportation (Non-Medical): No  Physical Activity: Insufficiently Active (09/22/2023)   Exercise Vital Sign    Days of Exercise per Week: 3 days    Minutes of Exercise per Session: 20 min  Stress: No Stress Concern Present (09/22/2023)   [employer] of Occupational Health - Occupational Stress Questionnaire    Feeling of Stress : Not at all  Social Connections: Moderately Isolated (09/22/2023)   Social Connection and Isolation Panel [NHANES]    Frequency of Communication with Friends and Family: More than three times a week    Frequency of Social Gatherings with Friends and Family: Never    Attends Religious Services: Never    Database administrator or Organizations: No    Attends Engineer, structural: Never    Marital Status: Married    Tobacco Counseling Counseling given: Not Answered   Clinical Intake:  Pre-visit preparation completed: Yes  Pain : No/denies pain Pain Score: 6  Pain Type: Acute pain (due to fibromyalgia)     BMI - recorded: 42.23 Nutritional Status: BMI > 30  Obese Nutritional Risks: None Diabetes: No  How often do you need to have someone help you when you read instructions,  pamphlets, or other written materials from your doctor or pharmacy?: 1 - Never  Interpreter Needed?: No  Information entered by :: Tonilynn Bieker, RMA   Activities of Daily Living    09/22/2023    8:24 AM 08/06/2023   10:28 AM  In your present state of health, do you have any difficulty performing the following activities:  Hearing? 0 0  Vision? 0 0  Difficulty concentrating or making decisions? 0 1  Walking or climbing stairs? 1 1  Dressing or bathing? 0 1  Doing errands, shopping? 0 1  Comment husband drives her   Preparing Food and eating ? N   Using the Toilet? N   In the past six months, have you accidently leaked urine? Y   Do you have problems with loss of bowel control? N   Managing your Medications? N   Managing your Finances? N   Housekeeping or managing your Housekeeping? N     Patient Care Team: Modena Slater, DO as PCP - General  Indicate any recent Medical Services you may have received from other than [facility] providers in the past year (date may be approximate).     Assessment:   This is a routine wellness examination for Eufemia.  Hearing/Vision screen Hearing Screening - Comments:: Denies hearing difficulties   Vision Screening - Comments:: Wears eyeglasses   Goals Addressed               This Visit's Progress     Patient Stated (pt-stated)        Working on weight lost.      Depression Screen    09/22/2023    8:39 AM 08/06/2023   10:29 AM 05/06/2023    9:51 AM 04/07/2023    3:11 PM 04/24/2020    9:35 AM  PHQ 2/9 Scores  PHQ - 2 Score 1 2 0 4 4  PHQ- 9 Score 6 6 0 12     Fall Risk  09/22/2023    8:29 AM 08/06/2023   10:28 AM 05/06/2023    9:51 AM 04/07/2023   11:31 AM 04/24/2020    9:34 AM  Fall Risk   Falls in the past year? 1 1 1 1 1   Number falls in past yr: 1 1 1 1 1   Injury with Fall? 0 0 1 0 1  Comment     fell and hit her head 1 month ago, no subdural hematoma  Risk for fall due to :  History of fall(s) History of fall(s) Impaired  balance/gait Mental status change  Follow up Falls evaluation completed;Falls prevention discussed Falls evaluation completed;Falls prevention discussed Falls evaluation completed;Falls prevention discussed Falls evaluation completed Falls prevention discussed    MEDICARE RISK AT HOME: Medicare Risk at Home Any stairs in or around the home?: No Home free of loose throw rugs in walkways, pet beds, electrical cords, etc?: Yes Adequate lighting in your home to reduce risk of falls?: Yes Life alert?: No Use of a cane, walker or w/c?: Yes (walker) Grab bars in the bathroom?: Yes Shower chair or bench in shower?: Yes Elevated toilet seat or a handicapped toilet?: No  TIMED UP AND GO:  Was the test performed?  No    Cognitive Function:        09/22/2023    8:32 AM  6CIT Screen  What Year? 0 points  What month? 0 points  What time? 0 points  Count back from 20 2 points  Months in reverse 0 points  Repeat phrase 2 points  Total Score 4 points    Immunizations Immunization History  Administered Date(s) Administered   Influenza, Seasonal, Injecte, Preservative Fre 08/06/2023    TDAP status: Due, Education has been provided regarding the importance of this vaccine. Advised may receive this vaccine at local pharmacy or Health Dept. Aware to provide a copy of the vaccination record if obtained from local pharmacy or Health Dept. Verbalized acceptance and understanding.  Flu Vaccine status: Up to date  Pneumococcal vaccine status: Due, Education has been provided regarding the importance of this vaccine. Advised may receive this vaccine at local pharmacy or Health Dept. Aware to provide a copy of the vaccination record if obtained from local pharmacy or Health Dept. Verbalized acceptance and understanding.  Covid-19 vaccine status: Completed vaccines  Qualifies for Shingles Vaccine? Yes   Zostavax completed No   Shingrix Completed?: No.    Education has been provided regarding the  importance of this vaccine. Patient has been advised to call insurance company to determine out of pocket expense if they have not yet received this vaccine. Advised may also receive vaccine at local pharmacy or Health Dept. Verbalized acceptance and understanding.  Screening Tests Health Maintenance  Topic Date Due   HIV Screening  Never done   Hepatitis C Screening  Never done   DTaP/Tdap/Td (1 - Tdap) Never done   Lung Cancer Screening  Never done   Zoster Vaccines- Shingrix (1 of 2) 11/16/2023 (Originally 01/14/1980)   Medicare Annual Wellness (AWV)  09/21/2024   MAMMOGRAM  02/09/2025   Fecal DNA (Cologuard)  10/14/2025   Cervical Cancer Screening (HPV/Pap Cotest)  02/10/2028   INFLUENZA VACCINE  Completed   HPV VACCINES  Aged Out   COVID-19 Vaccine  Discontinued    Health Maintenance  Health Maintenance Due  Topic Date Due   HIV Screening  Never done   Hepatitis C Screening  Never done   DTaP/Tdap/Td (1 - Tdap) Never done  Lung Cancer Screening  Never done    Colorectal cancer screening: Type of screening: Cologuard. Completed 10/14/2022. Repeat every 3 years  Mammogram status: Completed 02/10/2023. Repeat every year   Lung Cancer Screening: (Low Dose CT Chest recommended if Age 57-80 years, 20 pack-year currently smoking OR have quit w/in 15years.) does qualify.   Lung Cancer Screening Referral: N/A  Additional Screening:  Hepatitis C Screening: does qualify;  Vision Screening: Recommended annual ophthalmology exams for early detection of glaucoma and other disorders of the eye. Is the patient up to date with their annual eye exam?  Yes  Who is the provider or what is the name of the office in which the patient attends annual eye exams? N/A If pt is not established with a provider, would they like to be referred to a provider to establish care? Yes .   Dental Screening: Recommended annual dental exams for proper oral hygiene   Community Resource Referral /  Chronic Care Management: CRR required this visit?  Yes   CCM required this visit?  No     Plan:     I have personally reviewed and noted the following in the patient's chart:   Medical and social history Use of alcohol, tobacco or illicit drugs  Current medications and supplements including opioid prescriptions. Patient is not currently taking opioid prescriptions. Functional ability and status Nutritional status Physical activity Advanced directives List of other physicians Hospitalizations, surgeries, and ER visits in previous 12 months Vitals Screenings to include cognitive, depression, and falls Referrals and appointments  In addition, I have reviewed and discussed with patient certain preventive protocols, quality metrics, and best practice recommendations. A written personalized care plan for preventive services as well as general preventive health recommendations were provided to patient.     Welborn Keena L Swain Acree, CMA   09/22/2023   After Visit Summary: (MyChart) Due to this being a telephonic visit, the after visit summary with patients personalized plan was offered to patient via MyChart   Nurse Notes: Patient is due for a Tetanus vaccine and a Shingrix vaccine.  Patient declines any more Covid vaccines.  She stated that she has had a eye exam this year at [facility], however would like a referral from provider to an eye doctor.  Patient is also due for a Hep C screening and HIV screening.  She is eligible for a lung cancer screening, as she quit smoking only 7 years ago.  Patient would like a community referral for food, as she is having to eat less in order for food to last monthly.  Patient had no other concerns to address today.

## 2023-09-22 NOTE — Patient Instructions (Addendum)
Angela Mahoney , Thank you for taking time to come for your Medicare Wellness Visit. I appreciate your ongoing commitment to your health goals. Please review the following plan we discussed and let me know if I can assist you in the future.   Referrals/Orders/Follow-Ups/Clinician Recommendations: You are due for a Shingles vaccine and a Tetanus vaccine.  Remember to inquire about a eye doctor with your provider during your next office visit.  Someone should be calling you soon in regards to a community resource referral for food.  It was nice talking with you today.  Each day, aim for 6 glasses of water, plenty of protein in your diet and try to get up and walk/ stretch every hour for 5-10 minutes at a time.    This is a list of the screening recommended for you and due dates:  Health Maintenance  Topic Date Due   HIV Screening  Never done   Hepatitis C Screening  Never done   DTaP/Tdap/Td vaccine (1 - Tdap) Never done   Screening for Lung Cancer  Never done   Zoster (Shingles) Vaccine (1 of 2) 11/16/2023*   Medicare Annual Wellness Visit  09/21/2024   Mammogram  02/09/2025   Cologuard (Stool DNA test)  10/14/2025   Pap with HPV screening  02/10/2028   Flu Shot  Completed   HPV Vaccine  Aged Out   COVID-19 Vaccine  Discontinued  *Topic was postponed. The date shown is not the original due date.    Advanced directives: (Copy Requested) Please bring a copy of your health care power of attorney and living will to the office to be added to your chart at your convenience.  Next Medicare Annual Wellness Visit scheduled for next year: Yes

## 2023-09-24 ENCOUNTER — Other Ambulatory Visit: Payer: Self-pay | Admitting: Student

## 2023-09-24 DIAGNOSIS — I10 Essential (primary) hypertension: Secondary | ICD-10-CM | POA: Diagnosis not present

## 2023-09-24 DIAGNOSIS — M6281 Muscle weakness (generalized): Secondary | ICD-10-CM | POA: Diagnosis not present

## 2023-09-24 DIAGNOSIS — Z4789 Encounter for other orthopedic aftercare: Secondary | ICD-10-CM | POA: Diagnosis not present

## 2023-09-24 DIAGNOSIS — S8251XS Displaced fracture of medial malleolus of right tibia, sequela: Secondary | ICD-10-CM | POA: Diagnosis not present

## 2023-09-24 DIAGNOSIS — S8261XS Displaced fracture of lateral malleolus of right fibula, sequela: Secondary | ICD-10-CM | POA: Diagnosis not present

## 2023-09-24 DIAGNOSIS — R2689 Other abnormalities of gait and mobility: Secondary | ICD-10-CM | POA: Diagnosis not present

## 2023-09-28 ENCOUNTER — Other Ambulatory Visit: Payer: Self-pay

## 2023-09-29 NOTE — Telephone Encounter (Signed)
Patient is scheduled to return 12/20.

## 2023-09-30 ENCOUNTER — Other Ambulatory Visit: Payer: Self-pay | Admitting: Student

## 2023-09-30 DIAGNOSIS — F411 Generalized anxiety disorder: Secondary | ICD-10-CM | POA: Diagnosis not present

## 2023-09-30 DIAGNOSIS — F331 Major depressive disorder, recurrent, moderate: Secondary | ICD-10-CM | POA: Diagnosis not present

## 2023-09-30 DIAGNOSIS — F4002 Agoraphobia without panic disorder: Secondary | ICD-10-CM | POA: Diagnosis not present

## 2023-10-01 ENCOUNTER — Other Ambulatory Visit: Payer: Self-pay

## 2023-10-01 DIAGNOSIS — M19079 Primary osteoarthritis, unspecified ankle and foot: Secondary | ICD-10-CM | POA: Insufficient documentation

## 2023-10-01 DIAGNOSIS — M19071 Primary osteoarthritis, right ankle and foot: Secondary | ICD-10-CM | POA: Diagnosis not present

## 2023-10-01 DIAGNOSIS — M25571 Pain in right ankle and joints of right foot: Secondary | ICD-10-CM | POA: Diagnosis not present

## 2023-10-02 MED ORDER — BACLOFEN 5 MG PO TABS: 5.0000 mg | ORAL_TABLET | Freq: Every day | ORAL | 0 refills | Status: DC

## 2023-10-04 ENCOUNTER — Encounter (INDEPENDENT_AMBULATORY_CARE_PROVIDER_SITE_OTHER): Payer: Self-pay

## 2023-10-04 ENCOUNTER — Ambulatory Visit (INDEPENDENT_AMBULATORY_CARE_PROVIDER_SITE_OTHER): Payer: Medicare HMO | Admitting: Physician Assistant

## 2023-10-04 NOTE — Progress Notes (Deleted)
   SUBJECTIVE:  Chief Complaint: Obesity  Interim History: ***  Angela Mahoney is here to discuss her progress with her obesity treatment plan. She is on the {HWW Weight Loss Plan:210964005} and states she {CHL AMB IS/IS NOT:210130109} following her eating plan approximately *** % of the time. She states she {CHL AMB IS/IS NOT:210130109} exercising *** minutes *** times per week.   OBJECTIVE: Visit Diagnoses: Problem List Items Addressed This Visit   None Visit Diagnoses     Prediabetes    -  Primary   Vitamin D deficiency       Anxiety and depression       Obesity with starting BMI of 42.1           No data recorded No data recorded No data recorded No data recorded   ASSESSMENT AND PLAN:  Diet: Hiya {CHL AMB IS/IS NOT:210130109} currently in the action stage of change. As such, her goal is to {HWW Weight Loss Efforts:210964006}. She {HAS HAS DGU:44034} agreed to {HWW Weight Loss Plan:210964005}.  Exercise: Angela Mahoney has been instructed {HWW Exercise:210964007} for weight loss and overall health benefits.   Behavior Modification:  We discussed the following Behavioral Modification Strategies today: {HWW Behavior Modification:210964008}. We discussed various medication options to help Angela Mahoney with her weight loss efforts and we both agreed to ***.  No follow-ups on file.Marland Kitchen She was informed of the importance of frequent follow up visits to maximize her success with intensive lifestyle modifications for her multiple health conditions.  Attestation Statements:   Reviewed by clinician on day of visit: allergies, medications, problem list, medical history, surgical history, family history, social history, and previous encounter notes.   Time spent on visit including pre-visit chart review and post-visit care and charting was *** minutes.    Angela Hauser, PA-C

## 2023-10-05 DIAGNOSIS — F4381 Prolonged grief disorder: Secondary | ICD-10-CM | POA: Diagnosis not present

## 2023-10-06 ENCOUNTER — Encounter (INDEPENDENT_AMBULATORY_CARE_PROVIDER_SITE_OTHER): Payer: Self-pay | Admitting: Physician Assistant

## 2023-10-06 ENCOUNTER — Telehealth (INDEPENDENT_AMBULATORY_CARE_PROVIDER_SITE_OTHER): Payer: Medicare HMO | Admitting: Physician Assistant

## 2023-10-06 VITALS — Ht 64.0 in

## 2023-10-06 DIAGNOSIS — J069 Acute upper respiratory infection, unspecified: Secondary | ICD-10-CM | POA: Diagnosis not present

## 2023-10-06 DIAGNOSIS — R7303 Prediabetes: Secondary | ICD-10-CM | POA: Diagnosis not present

## 2023-10-06 DIAGNOSIS — Z6841 Body Mass Index (BMI) 40.0 and over, adult: Secondary | ICD-10-CM | POA: Diagnosis not present

## 2023-10-06 HISTORY — DX: Prediabetes: R73.03

## 2023-10-06 NOTE — Progress Notes (Signed)
TeleHealth Visit:  This visit was completed with telemedicine (audio/video) technology. Angela Mahoney has verbally consented to this TeleHealth visit. The patient is located at home, the provider is located at the office.  The participants in this visit include the listed provider and patient. The visit was conducted today via MyChart video.  OBESITY [name] is here to discuss her progress with her obesity treatment plan along with follow-up of her obesity related diagnoses.   Today's visit was # 3 Starting weight: 245 lbs Starting date: 08/31/2023 Weight at last in office visit: 245 lbs on 09/14/2023 Total weight loss: 0 lbs at last in office visit on 09/14/23. Today's reported weight (none reported): none reported  Nutrition Plan: the Category 1 plan - 60% adherence.  Current exercise:  Doing Physical therapy following foot surgery 2 x week at home initially and beginning out patient PT soon  Bariatric Surgery history: Sleeve gastrectomy 2018- Still has volume restrictions following surgery.  Pharmacotherapy: Previously on Ozempic up to 0.5 mg weekly, and weight under 200 lb while on Ozempic, but provider stopped prescribing.   She has a history of hypotension and is on Florinef 0.2 mg daily per Endocrinology.   Interim History:  Angela Mahoney is a 62 year old ifemale with a history of obesity, CAD with history of MI, prediabetes, vitamin D deficiency, anxiety, and depression. She has previously undergone bariatric surgery ,Sleeve gastrectomy in 2018 and is seen for virtual visit for follow up of her obesity treatment plan today.  Recently, she has been experiencing symptoms of an upper respiratory infection, including a sore throat and nasal congestion. She feels she is starting to recover some, but did not want to spread her URI to the office or other patients.  She has been doing some comfort eating, made home made chicken noodle soup and feels this is helping with symptoms.     The patient also reports a recent foot surgery, which has temporarily limited her mobility and driving ability. She has started outpatient physical therapy for the foot, but this has been interrupted by her recent illness.  The patient has been adhering to her obesity treatment plan about 60%, with a focus on dietary changes. She has been consuming homemade soup, prepared with unsalted broth, frozen carrots, and boneless, skinless chicken breast. She has also been using egg noodles made from egg whites.   In the mornings, the patient has been consuming a protein coffee latte drink, followed by a serving of egg around midday. She has expressed a desire to improve her hydration, acknowledging that she is not currently drinking enough water. She has been trying an electrolyte-enhanced water product, which she believes does not contain sugar.  The patient's husband has recently started receiving care from a [facility]-provided caretaker, who has offered to assist with meal preparation and planning. The patient is looking forward to working with the caretaker to improve her meal planning and preparation, particularly for her evening meals.   Not eating all of the food on the plan., Protein intake is less than prescribed., Is not drinking sugar sweetened beverages., Is not exceeding snack calorie allotment, Not meeting protein goals., Denies polyphagia, and Denies excessive cravings.  Eating all of the prescribed protein: no Skipping meals: No Drinking adequate water: No but improving intake Drinking sugar sweetened beverages: No    Pharmacotherapy: [name] is on None Bariatric Surgery: Sleeve gastrectomy 2018  Assessment/Plan:  Obesity Follow-up for obesity management. Adhering to dietary plan with protein drinks in the morning. Working on meal planning  with caretaker. Recent illness affected diet adherence. Discussed importance of hydration and protein intake. Encouraged use of non-yolk egg noodles  and unsalted broth for healthier options. - Continue dietary plan with focus on protein intake and hydration - Work with caretaker on meal planning and preparation - Encourage consumption of at least 80 ounces of fluids daily  Upper Respiratory Infection Sore throat and sinus congestion without fever, suggesting likely upper respiratory infection. Symptoms include sore throat, nasal congestion, and post-nasal drip. Discussed symptomatic treatment and importance of hydration. - Symptomatic treatment with hydration and rest - Monitor symptoms and seek medical attention if condition worsens  Prediabetes:  HgbA1c is at goal. Last A1c was 5.9  Medication(s): None She is working on a nutrition plan to decrease simple carbohydrates, increase lean proteins and exercise to promote weight loss, improve glycemic control and prevent progression to Type 2 diabetes.   Lab Results  Component Value Date   HGBA1C 5.9 (H) 08/31/2023   Lab Results  Component Value Date   LDLCALC 72 08/31/2023   CREATININE 0.96 08/31/2023   No results found for: "GFR"  Plan:  Continue working on nutrition plan to decrease simple carbohydrates, increase lean proteins and exercise to promote weight loss, improve glycemic control and prevent progression to Type 2 diabetes.    General Health Maintenance Encouraged to avoid sugary drinks and focus on healthy hydration options.  Discussed use of electrolyte water without added sugars. - Encourage use of electrolyte water without added sugars - Monitor fluid intake and ensure adequate hydration  Follow-up - Schedule follow-up appointment on Thursday, December 12th at 11:40 AM.  Morbid Obesity: Current BMI 42.21  Pharmacotherapy Plan   None for medical weight loss currently.  She is going to continue to work on nutritional and behavioral strategies to promote weight loss.    Angela Mahoney is currently in the action stage of change. As such, her goal is to continue with  weight loss efforts.  She has agreed to the Category 1 plan.  Exercise goals: No exercise has been prescribed at this time. Older adults should do exercises that maintain or improve balance if they are at risk of falling.  Older adults should determine their level of effort for physical activity relative to their level of fitness.  Older adults with chronic conditions should understand whether and how their conditions affect their ability to do regular physical activity safely.  Behavioral modification strategies: increasing lean protein intake, decreasing simple carbohydrates , no meal skipping, increase water intake, better snacking choices, planning for success, holiday eating strategies , work on smaller portions, and mindful eating.  Ayaan has agreed to follow-up with our clinic in 3 weeks.  No orders of the defined types were placed in this encounter.   There are no discontinued medications.   No orders of the defined types were placed in this encounter.     Objective:   VITALS: Per patient if applicable, see vitals. GENERAL: Alert and in no acute distress. CARDIOPULMONARY: No increased WOB. Speaking in clear sentences.  PSYCH: Pleasant and cooperative. Speech normal rate and rhythm. Affect is appropriate. Insight and judgement are appropriate. Attention is focused, linear, and appropriate.  NEURO: Oriented as arrived to appointment on time with no prompting.   Attestation Statements:   Reviewed by clinician on day of visit: allergies, medications, problem list, medical history, surgical history, family history, social history, and previous encounter notes.   Time spent on visit including the items listed below was 35 minutes.  -  preparing to see the patient (e.g., review of tests, history, previous notes) -obtaining and/or reviewing separately obtained history -counseling and educating the patient/family/caregiver -documenting clinical information in the electronic or other  health record -ordering medications, tests, or procedures -independently interpreting results and communicating results to the patient/ family/caregiver -referring and communicating with other health care professionals  -care coordination   This was prepared with the assistance of Engineer, civil (consulting).  Occasional wrong-word or sound-a-like substitutions may have occurred due to the inherent limitations of voice recognition software.

## 2023-10-18 DIAGNOSIS — M25571 Pain in right ankle and joints of right foot: Secondary | ICD-10-CM | POA: Diagnosis not present

## 2023-10-19 DIAGNOSIS — F320 Major depressive disorder, single episode, mild: Secondary | ICD-10-CM | POA: Diagnosis not present

## 2023-10-28 ENCOUNTER — Telehealth (INDEPENDENT_AMBULATORY_CARE_PROVIDER_SITE_OTHER): Payer: Medicare HMO | Admitting: Family Medicine

## 2023-10-28 DIAGNOSIS — E669 Obesity, unspecified: Secondary | ICD-10-CM

## 2023-10-28 DIAGNOSIS — R7303 Prediabetes: Secondary | ICD-10-CM

## 2023-10-28 DIAGNOSIS — Z6841 Body Mass Index (BMI) 40.0 and over, adult: Secondary | ICD-10-CM

## 2023-10-28 DIAGNOSIS — I2510 Atherosclerotic heart disease of native coronary artery without angina pectoris: Secondary | ICD-10-CM | POA: Diagnosis not present

## 2023-10-28 MED ORDER — OZEMPIC (0.25 OR 0.5 MG/DOSE) 2 MG/3ML ~~LOC~~ SOPN: 0.2500 mg | PEN_INJECTOR | SUBCUTANEOUS | 0 refills | Status: DC

## 2023-10-28 NOTE — Progress Notes (Signed)
TeleHealth Visit:  Due to the COVID-19 pandemic, this visit was completed with telemedicine (audio/video) technology to reduce patient and provider exposure as well as to preserve personal protective equipment.   Angela Mahoney has verbally consented to this TeleHealth visit. The patient is located at home, the provider is located at the [facility] office. The participants in this visit include the listed provider and patient .    Chief Complaint: OBESITY Angela Mahoney is here to discuss her progress with her obesity treatment plan along with follow-up of her obesity related diagnoses. Angela Mahoney is on the Category 1 Plan and states she is following her eating plan approximately 0% of the time.   Today's visit was #: 4 Starting weight: 245 Starting date: 08/31/23  Interim History: Patient is not feeling well after getting her shingles vaccination.  She is feeling particularly wiped out due to the shot and has been mostly laying on the couch. She has done well with integrating more vegetables into her diet. She has been doing frozen flavored ice for dessert. She is still taking in coffee in the am in form of premier protein.  Her insurance is changing in January to [place].  Subjective:   There are no diagnoses linked to this encounter. Assessment/Plan:   Problem List Items Addressed This Visit       Cardiovascular and Mediastinum   CAD (coronary artery disease)   Relevant Medications   Semaglutide,0.25 or 0.5MG /DOS, (OZEMPIC, 0.25 OR 0.5 MG/DOSE,) 2 MG/3ML SOPN     Other   Prediabetes - Primary   Patient A1c at 5.9.  She was previously on Ozempic with good blood sugar control and food intake choice control.  She is interested in seeing if insurance can cover this again.  We discussed that the authorization process can take between 7-10 days and that patient may not be approved.  Prescription sent in for 0.25mg  of Ozempic weekly.      Relevant Medications   Semaglutide,0.25  or 0.5MG /DOS, (OZEMPIC, 0.25 OR 0.5 MG/DOSE,) 2 MG/3ML SOPN    There are no diagnoses linked to this encounter. Angela Mahoney is currently in the action stage of change. As such, her goal is to continue with weight loss efforts. She has agreed to the Category 1 Plan.   Exercise goals: No exercise has been prescribed at this time.  Behavioral modification strategies: increasing lean protein intake, increasing vegetables, no skipping meals, and meal planning and cooking strategies.  Angela Mahoney has agreed to follow-up with our clinic in 4 weeks. She was informed of the importance of frequent follow-up visits to maximize her success with intensive lifestyle modifications for her multiple health conditions.   Objective:   VITALS: Per patient if applicable, see vitals. GENERAL: Alert and in no acute distress. CARDIOPULMONARY: No increased WOB. Speaking in clear sentences.  PSYCH: Pleasant and cooperative. Speech normal rate and rhythm. Affect is appropriate. Insight and judgement are appropriate. Attention is focused, linear, and appropriate.  NEURO: Oriented as arrived to appointment on time with no prompting.   Lab Results  Component Value Date   CREATININE 0.96 08/31/2023   BUN 11 08/31/2023   NA 144 08/31/2023   K 4.2 08/31/2023   CL 105 08/31/2023   CO2 24 08/31/2023   Lab Results  Component Value Date   ALT 22 08/31/2023   AST 26 08/31/2023   ALKPHOS 84 08/31/2023   BILITOT 0.3 08/31/2023   Lab Results  Component Value Date   HGBA1C 5.9 (H) 08/31/2023  Lab Results  Component Value Date   INSULIN 7.9 08/31/2023   Lab Results  Component Value Date   TSH 1.940 08/31/2023   Lab Results  Component Value Date   CHOL 167 08/31/2023   HDL 75 08/31/2023   LDLCALC 72 08/31/2023   TRIG 117 08/31/2023   Lab Results  Component Value Date   VD25OH 43.4 08/31/2023   VD25OH 46.62 07/31/2021   Lab Results  Component Value Date   WBC 5.1 08/31/2023   HGB 13.4 08/31/2023   HCT  42.0 08/31/2023   MCV 90 08/31/2023   PLT 182 08/31/2023   No results found for: "IRON", "TIBC", "FERRITIN"  Attestation Statements:   Reviewed by clinician on day of visit: allergies, medications, problem list, medical history, surgical history, family history, social history, and previous encounter notes.    Reuben Likes, MD

## 2023-11-02 DIAGNOSIS — F320 Major depressive disorder, single episode, mild: Secondary | ICD-10-CM | POA: Diagnosis not present

## 2023-11-02 DIAGNOSIS — S82851D Displaced trimalleolar fracture of right lower leg, subsequent encounter for closed fracture with routine healing: Secondary | ICD-10-CM | POA: Diagnosis not present

## 2023-11-02 DIAGNOSIS — M19071 Primary osteoarthritis, right ankle and foot: Secondary | ICD-10-CM | POA: Diagnosis not present

## 2023-11-03 NOTE — Assessment & Plan Note (Signed)
Significant history of CAD.  Patient sees Cardiology at [facility].  Will prescribe semaglutide to see if Medicare will cover for CAD.

## 2023-11-03 NOTE — Assessment & Plan Note (Signed)
Patient A1c at 5.9.  She was previously on Ozempic with good blood sugar control and food intake choice control.  She is interested in seeing if insurance can cover this again.  We discussed that the authorization process can take between 7-10 days and that patient may not be approved.  Prescription sent in for 0.25mg  of Ozempic weekly.

## 2023-11-04 ENCOUNTER — Other Ambulatory Visit: Payer: Self-pay | Admitting: Student

## 2023-11-04 DIAGNOSIS — F331 Major depressive disorder, recurrent, moderate: Secondary | ICD-10-CM | POA: Diagnosis not present

## 2023-11-04 DIAGNOSIS — F411 Generalized anxiety disorder: Secondary | ICD-10-CM | POA: Diagnosis not present

## 2023-11-04 DIAGNOSIS — F4002 Agoraphobia without panic disorder: Secondary | ICD-10-CM | POA: Diagnosis not present

## 2023-11-05 ENCOUNTER — Ambulatory Visit: Payer: Medicare HMO | Admitting: Student

## 2023-11-05 VITALS — BP 118/76 | HR 79 | Temp 98.1°F | Ht 64.0 in | Wt 248.3 lb

## 2023-11-05 DIAGNOSIS — N3946 Mixed incontinence: Secondary | ICD-10-CM | POA: Diagnosis not present

## 2023-11-05 MED ORDER — OXYBUTYNIN CHLORIDE 5 MG PO TABS: 5.0000 mg | ORAL_TABLET | Freq: Two times a day (BID) | ORAL | 0 refills | Status: DC

## 2023-11-05 NOTE — Progress Notes (Signed)
Established Patient Office Visit  Subjective   Patient ID: Angela Mahoney, female    DOB: [birth year]  Age: 62 y.o. MRN: [id number]  Chief Complaint  Patient presents with   Back Pain    Pain around lower back after standing 5-6 minutes (ie washing dishes)    Urinary Incontinence    Medication not working Requests referral to "specialist"     HPI This is a 62 year old female living with a history stated below and presents today for 3 months for urinary incontinence. Please see problem based assessment and plan for additional details.  Patient Active Problem List   Diagnosis Date Noted   Viral upper respiratory tract infection 10/06/2023   Prediabetes 10/06/2023   Obesity with starting BMI of 42.1 10/06/2023   Flu vaccine need 08/06/2023   Need for shingles vaccine 08/06/2023   Screening for breast cancer 08/06/2023   Colon cancer screening 08/06/2023   Urinary incontinence, stress and overactive 08/06/2023   History of bariatric surgery 04/08/2023   Fibromyalgia 04/08/2023   GERD (gastroesophageal reflux disease) 04/08/2023   Obesity (BMI 35.0-39.9 without comorbidity) 04/08/2023   Anxiety 04/08/2023   CAD (coronary artery disease) 04/08/2023   Hypotension 04/07/2023   COPD (chronic obstructive pulmonary disease) (HCC) 04/07/2023   Closed displaced trimalleolar fracture of right ankle 07/31/2021   Past Medical History:  Diagnosis Date   Anemia    Anxiety    Arthritis    Back pain    Bilateral swelling of feet    Chest pain    Chronic fatigue syndrome    COPD (chronic obstructive pulmonary disease) (HCC) 2017   Coronary artery disease    [place], [place]   Depression    Dyspnea    with exertion   Falls    Fibromyalgia    GERD (gastroesophageal reflux disease)    Headache    High cholesterol    History of fainting spells of unknown cause    ? r/t low blood pressure per daughter   Hypothyroidism    no longer treated   Joint pain    Myocardial  infarction (HCC) 01/2016   widowmaker in [place]- had cardiac cath   Prediabetes 10/06/2023   Rheumatoid arthritis (HCC)    Seasonal allergies    Sleep apnea    does not use cpap   SOB (shortness of breath)    Vitamin D deficiency    Past Surgical History:  Procedure Laterality Date   BUNIONECTOMY Right 2002   CARDIAC CATHETERIZATION  01/23/2016   In [place]   COLONOSCOPY     x 2 at ages 23yr and 49yr - [place]   EXTERNAL FIXATION REMOVAL Right 08/06/2021   Procedure: REMOVAL EXTERNAL FIXATION LEG;  Surgeon: Roby Lofts, MD;  Location: [facility] OR;  Service: Orthopedics;  Laterality: Right;  zimmer biomet ex-fix removal   LAPAROSCOPIC GASTRIC SLEEVE RESECTION  2017   [place]   MULTIPLE TOOTH EXTRACTIONS     has upper and lower dentures   ORIF ANKLE FRACTURE Right 08/06/2021   Procedure: OPEN REDUCTION INTERNAL FIXATION (ORIF) ANKLE FRACTURE;  Surgeon: Roby Lofts, MD;  Location: [facility] OR;  Service: Orthopedics;  Laterality: Right;  smith and nephew   SYNDESMOSIS REPAIR Right 12/15/2021   Procedure: SYNDESMOSIS REPAIR;  Surgeon: Roby Lofts, MD;  Location: [facility] OR;  Service: Orthopedics;  Laterality: Right;   Social History   Tobacco Use   Smoking status: Former    Current packs/day: 0.00  Average packs/day: 0.5 packs/day for 45.0 years (22.5 ttl pk-yrs)    Types: Cigarettes    Start date: 5    Quit date: 2014    Years since quitting: 10.9   Smokeless tobacco: Never  Vaping Use   Vaping status: Never Used  Substance Use Topics   Alcohol use: Never   Drug use: Never   Social History   Socioeconomic History   Marital status: Married    Spouse name: Dorene Sorrow   Number of children: 1   Years of education: Not on file   Highest education level: Not on file  Occupational History   Occupation: Disabled  Tobacco Use   Smoking status: Former    Current packs/day: 0.00    Average packs/day: 0.5 packs/day for 45.0 years (22.5 ttl pk-yrs)    Types: Cigarettes     Start date: 66    Quit date: 2014    Years since quitting: 10.9   Smokeless tobacco: Never  Vaping Use   Vaping status: Never Used  Substance and Sexual Activity   Alcohol use: Never   Drug use: Never   Sexual activity: Not Currently  Other Topics Concern   Not on file  Social History Narrative   Lives with her husband. Has a dog   Social Drivers of Corporate investment banker Strain: High Risk (09/22/2023)   Overall Financial Resource Strain (CARDIA)    Difficulty of Paying Living Expenses: Very hard  Food Insecurity: Food Insecurity Present (09/22/2023)   Hunger Vital Sign    Worried About Running Out of Food in the Last Year: Often true    Ran Out of Food in the Last Year: Sometimes true  Transportation Needs: No Transportation Needs (09/22/2023)   PRAPARE - Administrator, Civil Service (Medical): No    Lack of Transportation (Non-Medical): No  Physical Activity: Insufficiently Active (09/22/2023)   Exercise Vital Sign    Days of Exercise per Week: 3 days    Minutes of Exercise per Session: 20 min  Stress: No Stress Concern Present (09/22/2023)   [employer] of Occupational Health - Occupational Stress Questionnaire    Feeling of Stress : Not at all  Social Connections: Moderately Isolated (09/22/2023)   Social Connection and Isolation Panel [NHANES]    Frequency of Communication with Friends and Family: More than three times a week    Frequency of Social Gatherings with Friends and Family: Never    Attends Religious Services: Never    Database administrator or Organizations: No    Attends Banker Meetings: Never    Marital Status: Married  Catering manager Violence: Not At Risk (09/22/2023)   Humiliation, Afraid, Rape, and Kick questionnaire    Fear of Current or Ex-Partner: No    Emotionally Abused: No    Physically Abused: No    Sexually Abused: No   Family History  Problem Relation Age of Onset   Cancer Mother    Heart disease  Mother    Hyperlipidemia Mother    Alcoholism Mother    Heart disease Brother    Allergies  Allergen Reactions   Zolpidem Swelling    Tongue swelling, thrush symptoms.    ROS   ROS negative except for what is noted on the assessment and plan.  Objective:     BP 118/76 (BP Location: Left Arm, Patient Position: Sitting, Cuff Size: Large)   Pulse 79   Temp 98.1 F (36.7 C) (Oral)   Ht  5\' 4"  (1.626 m)   Wt 248 lb 4.8 oz (112.6 kg)   BMI 42.62 kg/m  BP Readings from Last 3 Encounters:  11/05/23 118/76  09/14/23 116/79  08/31/23 101/77   Wt Readings from Last 3 Encounters:  11/05/23 248 lb 4.8 oz (112.6 kg)  09/22/23 246 lb (111.6 kg)  09/14/23 246 lb (111.6 kg)   SpO2 Readings from Last 3 Encounters:  09/14/23 94%  08/31/23 94%  08/06/23 96%      Physical Exam  General: Sitting in chair, no acute distress Cardiovascular: Regular rate, no m/r/g.  Pulmonary: Lungs are clear to auscultation bilaterally, no wheezing or crackles Abdomen: Soft, nontender, nondistended, bowel sounds present  No results found for any visits on 11/05/23.  Last CBC Lab Results  Component Value Date   WBC 5.1 08/31/2023   HGB 13.4 08/31/2023   HCT 42.0 08/31/2023   MCV 90 08/31/2023   MCH 28.7 08/31/2023   RDW 13.3 08/31/2023   PLT 182 08/31/2023   Last metabolic panel Lab Results  Component Value Date   GLUCOSE 86 08/31/2023   NA 144 08/31/2023   K 4.2 08/31/2023   CL 105 08/31/2023   CO2 24 08/31/2023   BUN 11 08/31/2023   CREATININE 0.96 08/31/2023   EGFR 66 08/31/2023   CALCIUM 10.0 08/31/2023   PROT 6.6 08/31/2023   ALBUMIN 4.3 08/31/2023   LABGLOB 2.3 08/31/2023   BILITOT 0.3 08/31/2023   ALKPHOS 84 08/31/2023   AST 26 08/31/2023   ALT 22 08/31/2023   ANIONGAP 8 12/15/2021   Last lipids Lab Results  Component Value Date   CHOL 167 08/31/2023   HDL 75 08/31/2023   LDLCALC 72 08/31/2023   TRIG 117 08/31/2023   Last hemoglobin A1c Lab Results  Component  Value Date   HGBA1C 5.9 (H) 08/31/2023   Last thyroid functions Lab Results  Component Value Date   TSH 1.940 08/31/2023   T3TOTAL 94 08/31/2023      The ASCVD Risk score (Arnett DK, et al., 2019) failed to calculate for the following reasons:   Risk score cannot be calculated because patient has a medical history suggesting prior/existing ASCVD    Assessment & Plan:  Patient is discussed with Dr. Mikey Bussing   Problem List Items Addressed This Visit       Other   Urinary incontinence, stress and overactive - Primary   Patient presents today for her 21-month follow-up on her incontinence.  Patient reports sneezing, coughing, physical straining causes urinary incontinence.  States that she is dribbling until she goes to the bathroom.  States that she has been trying timed voiding, she is going to bathroom every 3 hours.  States that she is using 3 pads daily, 1 pad at night.  Patient reports that her symptoms have not improved.  Reports that she is unable to make it to the bathroom.  Plan: -Increase oxybutynin 5 mg from once a day to twice a day for 3 months -If patient's symptoms do not improve, please refer her to urology. -Please continue timed voiding and monitor fluid intake      Relevant Medications   oxybutynin (DITROPAN) 5 MG tablet    Return in about 3 months (around 02/03/2024) for urinary incontince .    Jeral Pinch, DO

## 2023-11-05 NOTE — Patient Instructions (Signed)
Thank you, Angela Mahoney for allowing [place] to provide your care today. Today we discussed   For your Urinary incontinence - Take oxybutynin 5 mg, one tablet by mouth TWO TIMES DAILY  - Continue timed voiding  We will re-evaluate you at the next visit and see if we need to refer you to urology  I have ordered the following labs for you:  Lab Orders  No laboratory test(s) ordered today     Tests ordered today:  None  Referrals ordered today:   Referral Orders  No referral(s) requested today     I have ordered the following medication/changed the following medications:   Stop the following medications: Medications Discontinued During This Encounter  Medication Reason   oxybutynin (DITROPAN) 5 MG tablet Reorder     Start the following medications: Meds ordered this encounter  Medications   oxybutynin (DITROPAN) 5 MG tablet    Sig: Take 1 tablet (5 mg total) by mouth 2 (two) times daily.    Dispense:  180 tablet    Refill:  0     Follow up: 3 months for urinary incontinence     Remember:   Should you have any questions or concerns please call the internal medicine clinic at [phone].     Jeral Pinch, DO [facility]

## 2023-11-05 NOTE — Assessment & Plan Note (Addendum)
Patient presents today for her 39-month follow-up on her incontinence.  Patient reports sneezing, coughing, physical straining causes urinary incontinence.  States that she is dribbling until she goes to the bathroom.  States that she has been trying timed voiding, she is going to bathroom every 3 hours.  States that she is using 3 pads daily, 1 pad at night.  Patient reports that her symptoms have not improved.  Reports that she is unable to make it to the bathroom.  Plan: -Increase oxybutynin 5 mg from once a day to twice a day for 3 months -If patient's symptoms do not improve, please refer her to urology. -Please continue timed voiding and monitor fluid intake

## 2023-11-08 NOTE — Progress Notes (Signed)
Internal Medicine Clinic Attending  I was physically present during the key portions of the resident provided service and participated in the medical decision making of patient's management care. I reviewed pertinent patient test results.  The assessment, diagnosis, and plan were formulated together and I agree with the documentation in the resident's note.  Gust Rung, DO

## 2023-11-15 ENCOUNTER — Telehealth: Payer: Self-pay

## 2023-11-15 NOTE — Telephone Encounter (Signed)
Copied from CRM [id number]. Topic: Appointments - Appointment Scheduling >> Nov 15, 2023  2:21 PM Isabell A wrote: Patient/patient representative is calling to schedule an appointment to establish care with a provider in the office (transfer of care). Patient see's a different provider within [facility] health.

## 2023-11-16 DIAGNOSIS — F320 Major depressive disorder, single episode, mild: Secondary | ICD-10-CM | POA: Diagnosis not present

## 2023-11-19 DIAGNOSIS — Z6835 Body mass index (BMI) 35.0-35.9, adult: Secondary | ICD-10-CM | POA: Diagnosis not present

## 2023-11-19 DIAGNOSIS — I2510 Atherosclerotic heart disease of native coronary artery without angina pectoris: Secondary | ICD-10-CM | POA: Diagnosis not present

## 2023-11-19 DIAGNOSIS — Z6841 Body Mass Index (BMI) 40.0 and over, adult: Secondary | ICD-10-CM | POA: Diagnosis not present

## 2023-11-19 DIAGNOSIS — R296 Repeated falls: Secondary | ICD-10-CM | POA: Diagnosis not present

## 2023-11-19 DIAGNOSIS — I959 Hypotension, unspecified: Secondary | ICD-10-CM | POA: Diagnosis not present

## 2023-11-19 DIAGNOSIS — R079 Chest pain, unspecified: Secondary | ICD-10-CM | POA: Diagnosis not present

## 2023-11-22 ENCOUNTER — Telehealth (INDEPENDENT_AMBULATORY_CARE_PROVIDER_SITE_OTHER): Payer: Self-pay

## 2023-11-22 NOTE — Telephone Encounter (Signed)
 Angela Mahoney, I received the response from your insurance for  Ozempic. Unfortunately, Ozempic was denied per Medicare rules and the drug guide. Other options can be discussed at your next visit. I hope you have a good day, [name], CMA

## 2023-12-09 ENCOUNTER — Ambulatory Visit (INDEPENDENT_AMBULATORY_CARE_PROVIDER_SITE_OTHER): Payer: No Typology Code available for payment source | Admitting: Family Medicine

## 2023-12-09 ENCOUNTER — Encounter: Payer: Self-pay | Admitting: Family Medicine

## 2023-12-09 VITALS — BP 108/70 | HR 90 | Temp 97.8°F | Ht 64.0 in | Wt 248.0 lb

## 2023-12-09 DIAGNOSIS — M797 Fibromyalgia: Secondary | ICD-10-CM | POA: Diagnosis not present

## 2023-12-09 DIAGNOSIS — I252 Old myocardial infarction: Secondary | ICD-10-CM | POA: Diagnosis not present

## 2023-12-09 DIAGNOSIS — I2510 Atherosclerotic heart disease of native coronary artery without angina pectoris: Secondary | ICD-10-CM | POA: Diagnosis not present

## 2023-12-09 DIAGNOSIS — R7303 Prediabetes: Secondary | ICD-10-CM | POA: Diagnosis not present

## 2023-12-09 DIAGNOSIS — F419 Anxiety disorder, unspecified: Secondary | ICD-10-CM

## 2023-12-09 DIAGNOSIS — Z903 Acquired absence of stomach [part of]: Secondary | ICD-10-CM | POA: Diagnosis not present

## 2023-12-09 DIAGNOSIS — F32A Depression, unspecified: Secondary | ICD-10-CM | POA: Diagnosis not present

## 2023-12-09 DIAGNOSIS — J449 Chronic obstructive pulmonary disease, unspecified: Secondary | ICD-10-CM | POA: Diagnosis not present

## 2023-12-09 DIAGNOSIS — I959 Hypotension, unspecified: Secondary | ICD-10-CM

## 2023-12-09 DIAGNOSIS — K219 Gastro-esophageal reflux disease without esophagitis: Secondary | ICD-10-CM

## 2023-12-09 MED ORDER — PANTOPRAZOLE SODIUM 40 MG PO TBEC: 40.0000 mg | DELAYED_RELEASE_TABLET | Freq: Every day | ORAL | 1 refills | Status: DC

## 2023-12-09 NOTE — Progress Notes (Addendum)
New Patient Office Visit  Subjective    Patient ID: Angela Mahoney, female    DOB: 08/21/1961  Age: 63 y.o. MRN: [id number]  CC:  Chief Complaint  Patient presents with   Establish Care    Heartburn since around September, mentioned to cardiologist she is going through lots of bottles of tums     HPI [name] presents to establish care  Previous PCP:  [facility] and then [facility] She used to live in [place], moved to [place] 4 years ago. Moved to [place] from [place].    Other providers: Cardiologist- Dr. Jodie Echevaria [facility], [facility]- Dr. Quintin Alto pulmonology in the past Psychiatrist- needs a new one that takes  Counselor- can no longer see them due to new insurance   States she gets Cymbalta, buspirone and Wellbutrin with psychiatrist    Fibromyalgia - takes gabapentin 900 in the AM, 600 mg in the PM, 600 mg at bedtime   Endocrinologist prescribes her Florinef for hypotension.    Cardiologist at [facility] has ordered a nuclear med stress test for chest pain at rest, CAD  States she had a sleep test with [facility] that was negative in the past 2 years.   C/o acid reflux since September. States she does not have a GI Reports taking Tums and Pepcid OTC  She is not on Ozempic due to insurance issues. Lost weight on it in the past. Taking Topamax 50 mg daily for weight loss.   Married and has a daughter.  She used to work in Designer, jewellery.   Smoked 5 years ago. Smoked for many years.   Outpatient Encounter Medications as of 12/09/2023  Medication Sig   albuterol (VENTOLIN HFA) 108 (90 Base) MCG/ACT inhaler Inhale 1-2 puffs into the lungs every 6 (six) hours as needed for shortness of breath or wheezing.   Ascorbic Acid (VITAMIN C-ROSE HIPS CR) 1500 MG TBCR Take 1,500 mg by mouth daily.   atorvastatin (LIPITOR) 80 MG tablet Take 1 tablet (80 mg total) by mouth daily with supper.   B Complex-Biotin-FA (B-COMPLEET-100) TABS     Baclofen 5 MG TABS Take 1 tablet (5 mg total) by mouth at bedtime.   buPROPion (WELLBUTRIN XL) 150 MG 24 hr tablet Take 1 tablet (150 mg total) by mouth every morning.   busPIRone (BUSPAR) 15 MG tablet Take 15 mg by mouth 3 (three) times daily.   Calcium Citrate-Vitamin D (CALCIUM CITRATE + D PO) Take 1 tablet by mouth daily.   DULoxetine (CYMBALTA) 60 MG capsule Take 2 capsules (120 mg total) by mouth at bedtime.   famotidine (PEPCID) 10 MG tablet Take 10 mg by mouth daily with breakfast.   fludrocortisone (FLORINEF) 0.1 MG tablet Take 0.2 mg by mouth daily.   fluticasone (FLONASE) 50 MCG/ACT nasal spray Place 1 spray into both nostrils daily as needed for allergies or rhinitis.   gabapentin (NEURONTIN) 300 MG capsule take 3 capsules by mouth in the morning, 2 capsules in the afternoon and 2 capsules at night   loratadine (CLARITIN) 10 MG tablet Take by mouth.   Multiple Vitamin (MULTIVITAMIN WITH MINERALS) TABS tablet Take 1 tablet by mouth daily.   ondansetron (ZOFRAN-ODT) 4 MG disintegrating tablet Take 1 tablet by mouth every 8 (eight) hours as needed for nausea/vomiting.   oxybutynin (DITROPAN) 5 MG tablet Take 1 tablet (5 mg total) by mouth 2 (two) times daily.   pantoprazole (PROTONIX) 40 MG tablet Take 1 tablet (40  mg total) by mouth daily.   Semaglutide,0.25 or 0.5MG /DOS, (OZEMPIC, 0.25 OR 0.5 MG/DOSE,) 2 MG/3ML SOPN Inject 0.25 mg into the skin once a week.   topiramate (TOPAMAX) 50 MG tablet TAKE ONE TABLET BY MOUTH ONE TIME DAILY   No facility-administered encounter medications on file as of 12/09/2023.    Past Medical History:  Diagnosis Date   Anemia    Anxiety    Arthritis    Back pain    Bilateral swelling of feet    Chest pain    Chronic fatigue syndrome    COPD (chronic obstructive pulmonary disease) (HCC) 2017   Coronary artery disease    [place]oxanne [place]ins, [place]   Depression    Dyspnea    with exertion   Falls    Fibromyalgia    GERD (gastroesophageal reflux disease)     Headache    High cholesterol    History of fainting spells of unknown cause    ? r/t low blood pressure per daughter   Hypothyroidism    no longer treated   Joint pain    Myocardial infarction (HCC) 01/2016   widowmaker in [place]- had cardiac cath   Prediabetes 10/06/2023   Rheumatoid arthritis (HCC)    Seasonal allergies    Sleep apnea    does not use cpap   SOB (shortness of breath)    Vitamin D deficiency     Past Surgical History:  Procedure Laterality Date   BUNIONECTOMY Right 2002   CARDIAC CATHETERIZATION  01/23/2016   In [place]   COLONOSCOPY     x 2 at ages 38yr and 66yr - [place]   EXTERNAL FIXATION REMOVAL Right 08/06/2021   Procedure: REMOVAL EXTERNAL FIXATION LEG;  Surgeon: Roby Lofts, MD;  [facility]: [facility];  Service: Orthopedics;  Laterality: Right;  zimmer biomet ex-fix removal   LAPAROSCOPIC GASTRIC SLEEVE RESECTION  2017   [place]   MULTIPLE TOOTH EXTRACTIONS     has upper and lower dentures   ORIF ANKLE FRACTURE Right 08/06/2021   Procedure: OPEN REDUCTION INTERNAL FIXATION (ORIF) ANKLE FRACTURE;  Surgeon: Roby Lofts, MD;  [facility]: [facility] OR;  Service: Orthopedics;  Laterality: Right;  smith and nephew   SYNDESMOSIS REPAIR Right 12/15/2021   Procedure: SYNDESMOSIS REPAIR;  Surgeon: Roby Lofts, MD;  Location: [facility] OR;  Service: Orthopedics;  Laterality: Right;    Family History  Problem Relation Age of Onset   Cancer Mother    Heart disease Mother    Hyperlipidemia Mother    Alcoholism Mother    Heart disease Brother     Social History   Socioeconomic History   Marital status: Married    Spouse name: Dorene Sorrow   Number of children: 1   Years of education: Not on file   Highest education level: Not on file  Occupational History   Occupation: Disabled  Tobacco Use   Smoking status: Former    Current packs/day: 0.00    Average packs/day: 0.5 packs/day for 45.0 years (22.5 ttl pk-yrs)    Types: Cigarettes    Start date: 65     Quit date: 2014    Years since quitting: 11.0   Smokeless tobacco: Never  Vaping Use   Vaping status: Never Used  Substance and Sexual Activity   Alcohol use: Never   Drug use: Never   Sexual activity: Not Currently  Other Topics Concern   Not on file  Social History Narrative   Lives with her husband. Has a  dog   Social Drivers of Corporate investment banker Strain: High Risk (09/22/2023)   Overall Financial Resource Strain (CARDIA)    Difficulty of Paying Living Expenses: Very hard  Food Insecurity: Food Insecurity Present (09/22/2023)   Hunger Vital Sign    Worried About Running Out of Food in the Last Year: Often true    Ran Out of Food in the Last Year: Sometimes true  Transportation Needs: No Transportation Needs (09/22/2023)   PRAPARE - Administrator, Civil Service (Medical): No    Lack of Transportation (Non-Medical): No  Physical Activity: Insufficiently Active (09/22/2023)   Exercise Vital Sign    Days of Exercise per Week: 3 days    Minutes of Exercise per Session: 20 min  Stress: No Stress Concern Present (09/22/2023)   [employer] of Occupational Health - Occupational Stress Questionnaire    Feeling of Stress : Not at all  Social Connections: Moderately Isolated (09/22/2023)   Social Connection and Isolation Panel [NHANES]    Frequency of Communication with Friends and Family: More than three times a week    Frequency of Social Gatherings with Friends and Family: Never    Attends Religious Services: Never    Database administrator or Organizations: No    Attends Banker Meetings: Never    Marital Status: Married  Catering manager Violence: Not At Risk (09/22/2023)   Humiliation, Afraid, Rape, and Kick questionnaire    Fear of Current or Ex-Partner: No    Emotionally Abused: No    Physically Abused: No    Sexually Abused: No    Review of Systems  Constitutional:  Negative for chills and fever.  Respiratory:  Negative for  shortness of breath.   Cardiovascular:  Negative for chest pain, palpitations and leg swelling.  Gastrointestinal:  Positive for heartburn. Negative for abdominal pain, constipation, diarrhea, nausea and vomiting.  Genitourinary:  Negative for dysuria, frequency and urgency.  Neurological:  Negative for dizziness and focal weakness.        Objective    BP 108/70 (BP Location: Left Arm, Patient Position: Sitting, Cuff Size: Large)   Pulse 90   Temp 97.8 F (36.6 C) (Temporal)   Ht 5\' 4"  (1.626 m)   Wt 248 lb (112.5 kg)   SpO2 94%   BMI 42.57 kg/m   Physical Exam Constitutional:      General: She is not in acute distress.    Appearance: She is not ill-appearing.  Eyes:     Extraocular Movements: Extraocular movements intact.     Conjunctiva/sclera: Conjunctivae normal.  Cardiovascular:     Rate and Rhythm: Normal rate.  Pulmonary:     Effort: Pulmonary effort is normal.  Musculoskeletal:     Cervical back: Normal range of motion and neck supple.  Skin:    General: Skin is warm and dry.  Neurological:     General: No focal deficit present.     Mental Status: She is alert and oriented to person, place, and time.  Psychiatric:        Mood and Affect: Mood normal.        Behavior: Behavior normal.        Thought Content: Thought content normal.         Assessment & Plan:   Problem List Items Addressed This Visit     Anxiety and depression   CAD (coronary artery disease)   COPD (chronic obstructive pulmonary disease) (HCC)   Fibromyalgia  GERD (gastroesophageal reflux disease) - Primary   Relevant Medications   pantoprazole (PROTONIX) 40 MG tablet   Hypotension   Prediabetes   Other Visit Diagnoses       H/O gastric sleeve         History of MI (myocardial infarction)          She is under the care of cardiology and [facility]. Reviewed records. Reviewed labs from October.  Start pantoprazole for GERD. Discussed how to properly take the medication and  lifestyle modifications to minimize GERD.  If she is not improving in the next month, I will refer her to GI.  Continue close follow up with her specialists. I will see her back in 4 weeks.   Visit time 30 minutes in face to face communication with patient and coordination of care, additional 15 minutes spent in record review, coordination or care, ordering tests, communicating/referring to other healthcare professionals, documenting in medical records all on the same day of the visit for total time 45 minutes spent on the visit.    Return in about 4 weeks (around 01/06/2024).   Hetty Blend, NP-C

## 2023-12-09 NOTE — Patient Instructions (Addendum)
Try taking pantoprazole at least 30 minutes in the morning before eating to see if this helps with your symptoms. If not, I will refer you to [facility].   Please call and schedule with [facility].

## 2023-12-11 DIAGNOSIS — M19071 Primary osteoarthritis, right ankle and foot: Secondary | ICD-10-CM | POA: Diagnosis not present

## 2023-12-11 DIAGNOSIS — F419 Anxiety disorder, unspecified: Secondary | ICD-10-CM | POA: Diagnosis not present

## 2023-12-11 DIAGNOSIS — E785 Hyperlipidemia, unspecified: Secondary | ICD-10-CM | POA: Diagnosis not present

## 2023-12-11 DIAGNOSIS — Z604 Social exclusion and rejection: Secondary | ICD-10-CM | POA: Diagnosis not present

## 2023-12-11 DIAGNOSIS — E669 Obesity, unspecified: Secondary | ICD-10-CM | POA: Diagnosis not present

## 2023-12-11 DIAGNOSIS — N3281 Overactive bladder: Secondary | ICD-10-CM | POA: Diagnosis not present

## 2023-12-11 DIAGNOSIS — F32A Depression, unspecified: Secondary | ICD-10-CM | POA: Diagnosis not present

## 2023-12-11 DIAGNOSIS — K219 Gastro-esophageal reflux disease without esophagitis: Secondary | ICD-10-CM | POA: Diagnosis not present

## 2023-12-11 DIAGNOSIS — Z7952 Long term (current) use of systemic steroids: Secondary | ICD-10-CM | POA: Diagnosis not present

## 2023-12-11 DIAGNOSIS — Z6841 Body Mass Index (BMI) 40.0 and over, adult: Secondary | ICD-10-CM | POA: Diagnosis not present

## 2023-12-13 NOTE — Addendum Note (Signed)
Addended by: Avanell Shackleton on: 12/13/2023 07:48 PM   Modules accepted: Level of Service

## 2023-12-16 DIAGNOSIS — F32A Depression, unspecified: Secondary | ICD-10-CM | POA: Diagnosis not present

## 2023-12-16 DIAGNOSIS — E785 Hyperlipidemia, unspecified: Secondary | ICD-10-CM | POA: Diagnosis not present

## 2023-12-16 DIAGNOSIS — M19071 Primary osteoarthritis, right ankle and foot: Secondary | ICD-10-CM | POA: Diagnosis not present

## 2023-12-16 DIAGNOSIS — Z7952 Long term (current) use of systemic steroids: Secondary | ICD-10-CM | POA: Diagnosis not present

## 2023-12-16 DIAGNOSIS — N3281 Overactive bladder: Secondary | ICD-10-CM | POA: Diagnosis not present

## 2023-12-16 DIAGNOSIS — Z6841 Body Mass Index (BMI) 40.0 and over, adult: Secondary | ICD-10-CM | POA: Diagnosis not present

## 2023-12-16 DIAGNOSIS — K219 Gastro-esophageal reflux disease without esophagitis: Secondary | ICD-10-CM | POA: Diagnosis not present

## 2023-12-16 DIAGNOSIS — Z604 Social exclusion and rejection: Secondary | ICD-10-CM | POA: Diagnosis not present

## 2023-12-16 DIAGNOSIS — F419 Anxiety disorder, unspecified: Secondary | ICD-10-CM | POA: Diagnosis not present

## 2023-12-16 DIAGNOSIS — E669 Obesity, unspecified: Secondary | ICD-10-CM | POA: Diagnosis not present

## 2023-12-21 DIAGNOSIS — E785 Hyperlipidemia, unspecified: Secondary | ICD-10-CM | POA: Diagnosis not present

## 2023-12-21 DIAGNOSIS — Z6841 Body Mass Index (BMI) 40.0 and over, adult: Secondary | ICD-10-CM | POA: Diagnosis not present

## 2023-12-21 DIAGNOSIS — F32A Depression, unspecified: Secondary | ICD-10-CM | POA: Diagnosis not present

## 2023-12-21 DIAGNOSIS — N3281 Overactive bladder: Secondary | ICD-10-CM | POA: Diagnosis not present

## 2023-12-21 DIAGNOSIS — E669 Obesity, unspecified: Secondary | ICD-10-CM | POA: Diagnosis not present

## 2023-12-21 DIAGNOSIS — Z7952 Long term (current) use of systemic steroids: Secondary | ICD-10-CM | POA: Diagnosis not present

## 2023-12-21 DIAGNOSIS — M19071 Primary osteoarthritis, right ankle and foot: Secondary | ICD-10-CM | POA: Diagnosis not present

## 2023-12-21 DIAGNOSIS — K219 Gastro-esophageal reflux disease without esophagitis: Secondary | ICD-10-CM | POA: Diagnosis not present

## 2023-12-21 DIAGNOSIS — Z604 Social exclusion and rejection: Secondary | ICD-10-CM | POA: Diagnosis not present

## 2023-12-21 DIAGNOSIS — F419 Anxiety disorder, unspecified: Secondary | ICD-10-CM | POA: Diagnosis not present

## 2023-12-23 DIAGNOSIS — Z7952 Long term (current) use of systemic steroids: Secondary | ICD-10-CM | POA: Diagnosis not present

## 2023-12-23 DIAGNOSIS — N3281 Overactive bladder: Secondary | ICD-10-CM | POA: Diagnosis not present

## 2023-12-23 DIAGNOSIS — F32A Depression, unspecified: Secondary | ICD-10-CM | POA: Diagnosis not present

## 2023-12-23 DIAGNOSIS — K219 Gastro-esophageal reflux disease without esophagitis: Secondary | ICD-10-CM | POA: Diagnosis not present

## 2023-12-23 DIAGNOSIS — Z6841 Body Mass Index (BMI) 40.0 and over, adult: Secondary | ICD-10-CM | POA: Diagnosis not present

## 2023-12-23 DIAGNOSIS — E669 Obesity, unspecified: Secondary | ICD-10-CM | POA: Diagnosis not present

## 2023-12-23 DIAGNOSIS — E785 Hyperlipidemia, unspecified: Secondary | ICD-10-CM | POA: Diagnosis not present

## 2023-12-23 DIAGNOSIS — Z604 Social exclusion and rejection: Secondary | ICD-10-CM | POA: Diagnosis not present

## 2023-12-23 DIAGNOSIS — M19071 Primary osteoarthritis, right ankle and foot: Secondary | ICD-10-CM | POA: Diagnosis not present

## 2023-12-23 DIAGNOSIS — F419 Anxiety disorder, unspecified: Secondary | ICD-10-CM | POA: Diagnosis not present

## 2023-12-30 ENCOUNTER — Other Ambulatory Visit: Payer: Self-pay | Admitting: Student

## 2023-12-30 NOTE — Telephone Encounter (Signed)
NO LONGER [facility] PATIENT

## 2023-12-31 ENCOUNTER — Other Ambulatory Visit: Payer: Self-pay | Admitting: Family Medicine

## 2024-01-03 NOTE — Telephone Encounter (Signed)
 This will be 1st fill w you

## 2024-01-04 DIAGNOSIS — Z7689 Persons encountering health services in other specified circumstances: Secondary | ICD-10-CM | POA: Diagnosis not present

## 2024-01-04 DIAGNOSIS — Z7182 Exercise counseling: Secondary | ICD-10-CM | POA: Diagnosis not present

## 2024-01-04 DIAGNOSIS — I2510 Atherosclerotic heart disease of native coronary artery without angina pectoris: Secondary | ICD-10-CM | POA: Diagnosis not present

## 2024-01-04 DIAGNOSIS — E66813 Obesity, class 3: Secondary | ICD-10-CM | POA: Diagnosis not present

## 2024-01-04 DIAGNOSIS — Z6841 Body Mass Index (BMI) 40.0 and over, adult: Secondary | ICD-10-CM | POA: Diagnosis not present

## 2024-01-04 DIAGNOSIS — F331 Major depressive disorder, recurrent, moderate: Secondary | ICD-10-CM | POA: Diagnosis not present

## 2024-01-04 DIAGNOSIS — R7303 Prediabetes: Secondary | ICD-10-CM | POA: Diagnosis not present

## 2024-01-04 DIAGNOSIS — Z713 Dietary counseling and surveillance: Secondary | ICD-10-CM | POA: Diagnosis not present

## 2024-01-06 ENCOUNTER — Ambulatory Visit: Payer: No Typology Code available for payment source | Admitting: Family Medicine

## 2024-01-12 ENCOUNTER — Encounter: Payer: Self-pay | Admitting: Family Medicine

## 2024-01-12 ENCOUNTER — Ambulatory Visit (INDEPENDENT_AMBULATORY_CARE_PROVIDER_SITE_OTHER): Payer: No Typology Code available for payment source | Admitting: Family Medicine

## 2024-01-12 VITALS — BP 124/86 | HR 88 | Temp 97.8°F | Ht 64.0 in | Wt 248.0 lb

## 2024-01-12 DIAGNOSIS — K219 Gastro-esophageal reflux disease without esophagitis: Secondary | ICD-10-CM | POA: Diagnosis not present

## 2024-01-12 DIAGNOSIS — M797 Fibromyalgia: Secondary | ICD-10-CM | POA: Diagnosis not present

## 2024-01-12 DIAGNOSIS — Z9884 Bariatric surgery status: Secondary | ICD-10-CM

## 2024-01-12 MED ORDER — PANTOPRAZOLE SODIUM 40 MG PO TBEC
40.0000 mg | DELAYED_RELEASE_TABLET | Freq: Every day | ORAL | 5 refills | Status: DC
Start: 2024-01-12 — End: 2024-08-09

## 2024-01-12 MED ORDER — BACLOFEN 5 MG PO TABS: 5.0000 mg | ORAL_TABLET | Freq: Every day | ORAL | 0 refills | Status: DC

## 2024-01-12 NOTE — Progress Notes (Signed)
 Subjective:     Patient ID: Angela Mahoney, female    DOB: Jun 19, 1961, 63 y.o.   MRN: [id number]  Chief Complaint  Patient presents with   Medical Management of Chronic Issues    4 week f/u    HPI  History of Present Illness         She is here to follow-up on acid reflux.  She has taken Protonix daily x 4 weeks.  Reports GERD symptoms have resolved.  States she takes baclofen 5 mg at bedtime for fibromyalgia pain.  States she has been taking this nightly for the past 7 years.  Requests refill on medication.    Health Maintenance Due  Topic Date Due   Pneumococcal Vaccine 36-106 Years old (1 of 2 - PCV) Never done   HIV Screening  Never done   Hepatitis C Screening  Never done   DTaP/Tdap/Td (1 - Tdap) Never done   Zoster Vaccines- Shingrix (1 of 2) Never done   Lung Cancer Screening  Never done    Past Medical History:  Diagnosis Date   Anemia    Anxiety    Arthritis    Back pain    Bilateral swelling of feet    Chest pain    Chronic fatigue syndrome    COPD (chronic obstructive pulmonary disease) (HCC) 2017   Coronary artery disease    Roxanne Mins, [place]   Depression    Dyspnea    with exertion   Falls    Fibromyalgia    GERD (gastroesophageal reflux disease)    Headache    High cholesterol    History of appendectomy 10/26/2022   History of fainting spells of unknown cause    ? 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   Prediabetes 10/06/2023   Rheumatoid arthritis (HCC)    Seasonal allergies    Sleep apnea    does not use cpap   SOB (shortness of breath)    Vitamin D deficiency     Past Surgical History:  Procedure Laterality Date   BUNIONECTOMY Right 2002   CARDIAC CATHETERIZATION  01/23/2016   In [place]   COLONOSCOPY     x 2 at ages 64yr  and 38yr - [place]   EXTERNAL FIXATION REMOVAL Right 08/06/2021   Procedure: REMOVAL EXTERNAL FIXATION LEG;  Surgeon: Roby Lofts, MD;  [facility];  Service: Orthopedics;  Laterality: Right;  zimmer biomet ex-fix removal   LAPAROSCOPIC GASTRIC SLEEVE RESECTION  2017   [place]   MULTIPLE TOOTH EXTRACTIONS     has upper and lower dentures   ORIF ANKLE FRACTURE Right 08/06/2021   Procedure: OPEN REDUCTION INTERNAL FIXATION (ORIF) ANKLE FRACTURE;  Surgeon: Roby Lofts, MD;  Location: [facility];  Service: Orthopedics;  Laterality: Right;  smith and nephew   SYNDESMOSIS REPAIR Right 12/15/2021   Procedure: SYNDESMOSIS REPAIR;  Surgeon: Roby Lofts, MD;  Location: [facility];  Service: Orthopedics;  Laterality: Right;    Family History  Problem Relation Age of Onset   Cancer Mother    Heart disease Mother    Hyperlipidemia Mother    Alcoholism Mother    Heart disease Brother     Social History   Socioeconomic History   Marital status: Married  Spouse name: Dorene Sorrow   Number of children: 1   Years of education: Not on file   Highest education level: Not on file  Occupational History   Occupation: Disabled  Tobacco Use   Smoking status: Former    Current packs/day: 0.00    Average packs/day: 0.5 packs/day for 45.0 years (22.5 ttl pk-yrs)    Types: Cigarettes    Start date: 26    Quit date: 2014    Years since quitting: 11.1   Smokeless tobacco: Never  Vaping Use   Vaping status: Never Used  Substance and Sexual Activity   Alcohol use: Never   Drug use: Never   Sexual activity: Not Currently  Other Topics Concern   Not on file  Social History Narrative   Lives with her husband. Has a dog   Social Drivers of Corporate investment banker Strain: Low Risk  (01/05/2024)   Received from [facility]   Overall Financial Resource Strain (CARDIA)    Difficulty of Paying Living Expenses: Not very hard  Food Insecurity: No Food Insecurity (01/05/2024)   Received from  [facility]   Hunger Vital Sign    Worried About Running Out of Food in the Last Year: Never true    Ran Out of Food in the Last Year: Never true  Transportation Needs: No Transportation Needs (01/05/2024)   Received from [facility] - Transportation    Lack of Transportation (Medical): No    Lack of Transportation (Non-Medical): No  Physical Activity: Insufficiently Active (01/05/2024)   Received from [facility]   Exercise Vital Sign    Days of Exercise per Week: 1 day    Minutes of Exercise per Session: 20 min  Stress: Stress Concern Present (01/05/2024)   Received from [facility] of Occupational Health - Occupational Stress Questionnaire    Feeling of Stress : To some extent  Social Connections: Socially Isolated (01/05/2024)   Received from [facility]   Social Network    How would you rate your social network (family, work, friends)?: Little participation, lonely and socially isolated  Intimate Partner Violence: Not At Risk (01/05/2024)   Received from [facility]   HITS    Over the last 12 months how often did your partner physically hurt you?: Never    Over the last 12 months how often did your partner insult you or talk down to you?: Never    Over the last 12 months how often did your partner threaten you with physical harm?: Never    Over the last 12 months how often did your partner scream or curse at you?: Never    Outpatient Medications Prior to Visit  Medication Sig Dispense Refill   albuterol (VENTOLIN HFA) 108 (90 Base) MCG/ACT inhaler Inhale 1-2 puffs into the lungs every 6 (six) hours as needed for shortness of breath or wheezing.     Ascorbic Acid (VITAMIN C-ROSE HIPS CR) 1500 MG TBCR Take 1,500 mg by mouth daily.     atorvastatin (LIPITOR) 80 MG tablet Take 1 tablet (80 mg total) by mouth daily with supper. 90 tablet 1   B Complex-Biotin-FA (B-COMPLEET-100) TABS      buPROPion (WELLBUTRIN XL) 150 MG 24 hr tablet Take 1  tablet (150 mg total) by mouth every morning. 30 tablet 2   busPIRone (BUSPAR) 15 MG tablet Take 15 mg by mouth 3 (three) times daily.     Calcium Citrate-Vitamin D (CALCIUM CITRATE +  D PO) Take 1 tablet by mouth daily.     DULoxetine (CYMBALTA) 60 MG capsule Take 2 capsules (120 mg total) by mouth at bedtime.  3   famotidine (PEPCID) 10 MG tablet Take 10 mg by mouth daily with breakfast.     fludrocortisone (FLORINEF) 0.1 MG tablet Take 0.2 mg by mouth daily.     fluticasone (FLONASE) 50 MCG/ACT nasal spray Place 1 spray into both nostrils daily as needed for allergies or rhinitis.     gabapentin (NEURONTIN) 300 MG capsule take 3 capsules by mouth in the morning, 2 capsules in the afternoon and 2 capsules at night 630 capsule 0   loratadine (CLARITIN) 10 MG tablet Take by mouth.     Multiple Vitamin (MULTIVITAMIN WITH MINERALS) TABS tablet Take 1 tablet by mouth daily.     ondansetron (ZOFRAN-ODT) 4 MG disintegrating tablet Take 1 tablet by mouth every 8 (eight) hours as needed for nausea/vomiting.     oxybutynin (DITROPAN) 5 MG tablet Take 1 tablet (5 mg total) by mouth 2 (two) times daily. 180 tablet 0   Semaglutide,0.25 or 0.5MG /DOS, (OZEMPIC, 0.25 OR 0.5 MG/DOSE,) 2 MG/3ML SOPN Inject 0.25 mg into the skin once a week. 3 mL 0   topiramate (TOPAMAX) 50 MG tablet TAKE ONE TABLET BY MOUTH ONE TIME DAILY 90 tablet 0   Baclofen 5 MG TABS Take 1 tablet (5 mg total) by mouth at bedtime. 90 tablet 0   pantoprazole (PROTONIX) 40 MG tablet Take 1 tablet (40 mg total) by mouth daily. 30 tablet 1   No facility-administered medications prior to visit.    Allergies  Allergen Reactions   Zolpidem Swelling    Tongue swelling, thrush symptoms.    Review of Systems  Constitutional:  Negative for chills and fever.  Respiratory:  Negative for shortness of breath.   Cardiovascular:  Negative for chest pain, palpitations and leg swelling.  Gastrointestinal:  Negative for abdominal pain, diarrhea, nausea  and vomiting.  Musculoskeletal:  Positive for joint pain and myalgias.  Neurological:  Negative for dizziness, focal weakness and headaches.       Objective:    Physical Exam Constitutional:      General: She is not in acute distress.    Appearance: She is not ill-appearing.  Eyes:     Extraocular Movements: Extraocular movements intact.     Conjunctiva/sclera: Conjunctivae normal.  Cardiovascular:     Rate and Rhythm: Normal rate.  Pulmonary:     Effort: Pulmonary effort is normal.  Musculoskeletal:     Cervical back: Normal range of motion and neck supple.  Skin:    General: Skin is warm and dry.  Neurological:     General: No focal deficit present.     Mental Status: She is alert and oriented to person, place, and time.  Psychiatric:        Mood and Affect: Mood normal.        Behavior: Behavior normal.        Thought Content: Thought content normal.      BP 124/86 (BP Location: Left Arm, Patient Position: Sitting)   Pulse 88   Temp 97.8 F (36.6 C) (Temporal)   Ht 5\' 4"  (1.626 m)   Wt 248 lb (112.5 kg)   SpO2 98%   BMI 42.57 kg/m  Wt Readings from Last 3 Encounters:  01/12/24 248 lb (112.5 kg)  12/09/23 248 lb (112.5 kg)  11/05/23 248 lb 4.8 oz (112.6 kg)  Assessment & Plan:   Problem List Items Addressed This Visit     Fibromyalgia   Relevant Medications   Baclofen 5 MG TABS   GERD (gastroesophageal reflux disease) - Primary   Relevant Medications   pantoprazole (PROTONIX) 40 MG tablet   History of bariatric surgery   Continue Protonix and lifestyle modifications to prevent GERD symptoms.  She will follow-up if symptoms return and I will refer her to GI.  She reports being up-to-date on colonoscopy. Refilled baclofen per patient request.  She reports being on this medication nightly x 7 years.  I am having Angela Mahoney "Deb" maintain her albuterol, ondansetron, Calcium Citrate-Vitamin D (CALCIUM CITRATE + D PO), multivitamin with  minerals, fluticasone, fludrocortisone, busPIRone, famotidine, VITAMIN C-ROSE HIPS CR, DULoxetine, loratadine, buPROPion, B-Compleet-100, atorvastatin, topiramate, Ozempic (0.25 or 0.5 MG/DOSE), gabapentin, oxybutynin, pantoprazole, and Baclofen.  Meds ordered this encounter  Medications   pantoprazole (PROTONIX) 40 MG tablet    Sig: Take 1 tablet (40 mg total) by mouth daily.    Dispense:  30 tablet    Refill:  5    Supervising Provider:   Hillard Danker A [[id number]]   Baclofen 5 MG TABS    Sig: Take 1 tablet (5 mg total) by mouth at bedtime.    Dispense:  90 tablet    Refill:  0    Supervising Provider:   Hillard Danker A [[id number]]

## 2024-01-12 NOTE — Patient Instructions (Signed)
 Continue Protonix daily.  Continue avoiding foods and beverages that trigger acid reflux.  If your symptoms are no longer controlled, I will refer you to gastroenterology

## 2024-01-13 DIAGNOSIS — R9439 Abnormal result of other cardiovascular function study: Secondary | ICD-10-CM | POA: Diagnosis not present

## 2024-01-13 DIAGNOSIS — I2510 Atherosclerotic heart disease of native coronary artery without angina pectoris: Secondary | ICD-10-CM | POA: Diagnosis not present

## 2024-01-13 DIAGNOSIS — R079 Chest pain, unspecified: Secondary | ICD-10-CM | POA: Diagnosis not present

## 2024-01-13 DIAGNOSIS — I959 Hypotension, unspecified: Secondary | ICD-10-CM | POA: Diagnosis not present

## 2024-01-18 ENCOUNTER — Other Ambulatory Visit: Payer: Self-pay | Admitting: Student

## 2024-01-18 DIAGNOSIS — Z6841 Body Mass Index (BMI) 40.0 and over, adult: Secondary | ICD-10-CM | POA: Diagnosis not present

## 2024-01-18 DIAGNOSIS — E66813 Obesity, class 3: Secondary | ICD-10-CM | POA: Diagnosis not present

## 2024-01-19 NOTE — Telephone Encounter (Signed)
 NO LONGER [facility] PATIENT

## 2024-01-21 ENCOUNTER — Other Ambulatory Visit: Payer: Self-pay | Admitting: Student

## 2024-01-24 NOTE — Telephone Encounter (Signed)
 NOT [facility] PATIENT

## 2024-02-01 ENCOUNTER — Ambulatory Visit: Admitting: Psychiatry

## 2024-02-01 DIAGNOSIS — F419 Anxiety disorder, unspecified: Secondary | ICD-10-CM

## 2024-02-01 DIAGNOSIS — F32A Depression, unspecified: Secondary | ICD-10-CM

## 2024-02-01 DIAGNOSIS — F4010 Social phobia, unspecified: Secondary | ICD-10-CM | POA: Diagnosis not present

## 2024-02-01 NOTE — Progress Notes (Addendum)
 [facility] Counselor Initial Adult Exam  Name: Cathlene Gardella Date: 02/01/2024 MRN: [id number] DOB: [birth year] PCP: Avanell Shackleton, NP-C  Time spent: 60 minutes   Guardian/Payee:  patient  Paperwork requested:  No   Reason for Visit /Presenting Problem:  social anxiety, depression; on SS Disability for depression, anxiety, and fibromyalgia    Mental Status Exam:    Appearance:   Casual and Neat     Behavior:  Appropriate, Sharing, and Motivated  Motor:  Uses rollator in walking  Speech/Language:   Clear and Coherent  Affect:  Depressed and anxiety  Mood:  anxious and depressed  Thought process:  goal directed  Thought content:    Rumination  Sensory/Perceptual disturbances:    WNL  Orientation:  oriented to person, place, but stated today's date is March 20th.  Attention:  Fair  Concentration:  Fair  Memory:  WNL But also some "forgetting around dates/times" at times and feels it is related to her physical health issues also  Fund of knowledge:   Good and Fair  Insight:    Good and Fair  Judgment:   Good  Impulse Control:  Good and Fair   Reported Symptoms:  see symptoms above  Risk Assessment: Danger to Self:  No Self-injurious Behavior: No Danger to Others: No Duty to Warn:no Physical Aggression / Violence:No  Access to Firearms a concern: No  Gang Involvement:No  Patient / guardian was educated about steps to take if suicide or homicide risk level increases between visits: yes While future psychiatric events cannot be accurately predicted, the patient does not currently require acute inpatient psychiatric care and does not currently meet [facility] involuntary commitment criteria.  Substance Abuse History: Current substance abuse: No     Past Psychiatric History:   Previous psychological history is significant for anxiety, depression, and had some temporary SI which she reports was about 6 yrs ago Outpatient Providers:yes but patient can't think  of her name History of Psych Hospitalization: No  Psychological Testing:  n/a    Abuse History: Victim of No.,  n/a    Report needed: No. Victim of Neglect:No. Perpetrator of  n/a   Witness / Exposure to Domestic Violence: No   Protective Services Involvement: No  Witness to [employer] Violence:  No   Family History: Patient confirmed info below. Family History  Problem Relation Age of Onset   Cancer Mother    Heart disease Mother    Hyperlipidemia Mother    Alcoholism Mother    Heart disease Brother     Living situation: the patient lives with their spouse and has been married since 63. 1 adult child age 65 and lives in [place], not real supportive but will help with "some things like fill my pill container".  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, and 1 friend  Barriers:   some barriers "due to some motivational issues and some feeling bad about myself"; sometimes "shame"  Legal History: Pending legal issue / charges: The patient has no significant history of legal issues. History of legal issue / charges:  no  Medical History/Surgical History:reviewed with patient Past Medical History:  Diagnosis Date   Anemia    Anxiety    Arthritis    Back  pain    Bilateral swelling of feet    Chest pain    Chronic fatigue syndrome    COPD (chronic obstructive pulmonary disease) (HCC) 2017   Coronary artery disease    [place], [place]   Depression    Dyspnea    with exertion   Falls    Fibromyalgia    GERD (gastroesophageal reflux disease)    Headache    High cholesterol    History of appendectomy 10/26/2022   History of fainting spells of unknown cause    ? 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   Prediabetes 10/06/2023   Rheumatoid arthritis (HCC)    Seasonal allergies    Sleep apnea    does not use cpap   SOB (shortness of breath)    Vitamin D deficiency     Past Surgical History:  Procedure Laterality Date   BUNIONECTOMY Right 2002   CARDIAC CATHETERIZATION  01/23/2016   In [place]   COLONOSCOPY     x 2 at ages 63yr and 63yr - [place]   EXTERNAL FIXATION REMOVAL Right 08/06/2021   Procedure: REMOVAL EXTERNAL FIXATION LEG;  Surgeon: Roby Lofts, MD;  Location: [facility] OR;  Service: Orthopedics;  Laterality: Right;  zimmer biomet ex-fix removal   LAPAROSCOPIC GASTRIC SLEEVE RESECTION  2017   [place]   MULTIPLE TOOTH EXTRACTIONS     has upper and lower dentures   ORIF ANKLE FRACTURE Right 08/06/2021   Procedure: OPEN REDUCTION INTERNAL FIXATION (ORIF) ANKLE FRACTURE;  Surgeon: Roby Lofts, MD;  Location: [facility] OR;  Service: Orthopedics;  Laterality: Right;  smith and nephew   SYNDESMOSIS REPAIR Right 12/15/2021   Procedure: SYNDESMOSIS REPAIR;  Surgeon: Roby Lofts, MD;  Location: [facility] OR;  Service: Orthopedics;  Laterality: Right;    Medications: Current Outpatient Medications  Medication Sig Dispense Refill   albuterol (VENTOLIN HFA) 108 (90 Base) MCG/ACT inhaler Inhale 1-2 puffs into the lungs every 6 (six) hours as  needed for shortness of breath or wheezing.     Ascorbic Acid (VITAMIN C-ROSE HIPS CR) 1500 MG TBCR Take 1,500 mg by mouth daily.     atorvastatin (LIPITOR) 80 MG tablet Take 1 tablet (80 mg total) by mouth daily with supper. 90 tablet 1   B Complex-Biotin-FA (B-COMPLEET-100) TABS      Baclofen 5 MG TABS Take 1 tablet (5 mg total) by mouth at bedtime. 90 tablet 0   buPROPion (WELLBUTRIN XL) 150 MG 24 hr tablet Take 1 tablet (150 mg total) by mouth every morning. 30 tablet 2   busPIRone (BUSPAR) 15 MG tablet Take 15 mg by mouth 3 (three) times daily.     Calcium Citrate-Vitamin D (CALCIUM CITRATE + D PO) Take 1 tablet by mouth daily.     DULoxetine (CYMBALTA) 60 MG  capsule Take 2 capsules (120 mg total) by mouth at bedtime.  3   famotidine (PEPCID) 10 MG tablet Take 10 mg by mouth daily with breakfast.     fludrocortisone (FLORINEF) 0.1 MG tablet Take 0.2 mg by mouth daily.     fluticasone (FLONASE) 50 MCG/ACT nasal spray Place 1 spray into both nostrils daily as needed for allergies or rhinitis.     gabapentin (NEURONTIN) 300 MG capsule take 3 capsules by mouth in the morning, 2 capsules in the afternoon and 2 capsules at night 630 capsule 0   loratadine (CLARITIN) 10 MG tablet Take by mouth.     Multiple Vitamin (MULTIVITAMIN WITH MINERALS) TABS tablet Take 1 tablet by mouth daily.     ondansetron (ZOFRAN-ODT) 4 MG disintegrating tablet Take 1 tablet by mouth every 8 (eight) hours as needed for nausea/vomiting.     oxybutynin (DITROPAN) 5 MG tablet Take 1 tablet (5 mg total) by mouth 2 (two) times daily. 180 tablet 0   pantoprazole (PROTONIX) 40 MG tablet Take 1 tablet (40 mg total) by mouth daily. 30 tablet 5   Semaglutide,0.25 or 0.5MG /DOS, (OZEMPIC, 0.25 OR 0.5 MG/DOSE,) 2 MG/3ML SOPN Inject 0.25 mg into the skin once a week. 3 mL 0   topiramate (TOPAMAX) 50 MG tablet TAKE ONE TABLET BY MOUTH ONE TIME DAILY 90 tablet 0   No current facility-administered medications for this visit.     Allergies  Allergen Reactions   Zolpidem Swelling    Tongue swelling, thrush symptoms.    Diagnoses:    ICD-10-CM   1. Social anxiety disorder  F40.10       Treatment goal plan of care: Patient not signing treatment plan on computer screen due to continued concerns with COVID and other illnesses.  We did work collaboratively on her treatment plan and she is in agreement with it.  Goals remain on treatment plan as patient works with strategies in sessions and outside of sessions to meet her goals.  Progress is assessed each session and documented in the "plan" or "progress" sections of treatment note. 1 identify major life conflicts from her past and present that form the basis for present anxiety and depression. 2.  Develop cognitive and behavioral strategies to reduce or eliminate her excessive anxiety and depressive symptomology. 3 identify, challenge, and replace fearful and anxious self talk with more positive, realistic, and empowering self talk. 4.  Help patient to develop healthy self talk as a means of helping to manage her anxiety and depression. 5.  Encouraged patient to increase daily social experiences and to work on strengthening a nonavoidant approach and build self-confidence. 6.  Assist patient in developing reality based, positive, cognitive messages.  Plan of Care:  Today is the first session for this patient with this therapist.  Orville Govern "Reece Levy", is a 63 year old, married since 1980 to her husband who is supportive of patient. They have 1 daughter who is married and has "2 grown children and 1 75 yr old grandchild". Adult daughter "doesn't have much time to help me and my husband out." Daughter is employed.  Patient acknowledges she tends to not be a very open person and does not often talk about her issues very openly.  No close friends but has made a friend with 65 yr old female neighbor. 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 She and husband "who are both home-bodies", and neither have  many friends. Lives in apt complex for the 4th year now but do not really know any of their neighbors. Does have 4 brothers living and they are spread out in different states but does stay in touch with them. Tends to be "more withdrawn and into herself". "But my husband is the opposite and very outgoing." Would like to have more friends but "haven't figured out how just yet due to my limitations at times and my husband has parkinson's disease and it is progressing some." States she is coming to treatment now to work on her social anxiety, depression. Last seen in therapy by another therapist but patient had to switch due to change in insurance. Some motivation but also acknowledges some challenges in that motivation. Discussed treatment goals (as stated in above section) and challenges with patient, encouraging her to work on goals established today and homework assigned.   Review of initial treatment goals with patient and she is in agreement.  Next appointment within 2 to 3 weeks.   Mathis Fare, LCSW

## 2024-02-03 ENCOUNTER — Encounter: Payer: Medicare HMO | Admitting: Student

## 2024-02-03 DIAGNOSIS — E639 Nutritional deficiency, unspecified: Secondary | ICD-10-CM | POA: Diagnosis not present

## 2024-02-05 ENCOUNTER — Other Ambulatory Visit: Payer: Self-pay | Admitting: Student

## 2024-02-07 NOTE — Telephone Encounter (Signed)
 NOT [facility] PATIENT

## 2024-02-09 ENCOUNTER — Other Ambulatory Visit: Payer: Self-pay | Admitting: Student

## 2024-02-10 DIAGNOSIS — Z6841 Body Mass Index (BMI) 40.0 and over, adult: Secondary | ICD-10-CM | POA: Diagnosis not present

## 2024-02-10 DIAGNOSIS — Z713 Dietary counseling and surveillance: Secondary | ICD-10-CM | POA: Diagnosis not present

## 2024-02-10 DIAGNOSIS — E66813 Obesity, class 3: Secondary | ICD-10-CM | POA: Diagnosis not present

## 2024-02-10 NOTE — Telephone Encounter (Signed)
 NOT [facility] PATIENT

## 2024-02-14 ENCOUNTER — Telehealth: Payer: Self-pay | Admitting: Family Medicine

## 2024-02-14 ENCOUNTER — Other Ambulatory Visit: Payer: Self-pay | Admitting: Student

## 2024-02-14 NOTE — Telephone Encounter (Signed)
 LM for pt we need to know which exact medications need refilling, please let us know

## 2024-02-14 NOTE — Telephone Encounter (Signed)
 NOT [facility] PATIENT

## 2024-02-14 NOTE — Telephone Encounter (Signed)
 Called pt for clarification she says she is all out of the Oxybutynin and is asking for a refill. Ok to refill?  She reports she will discuss at her visit on 4/2 the other rx's she needs switched over to [facility].

## 2024-02-14 NOTE — Telephone Encounter (Signed)
 Copied from CRM [id number]. Topic: Clinical - Medication Question >> Feb 14, 2024 11:35 AM Fonda Kinder J wrote: Reason for CRM: Pt states she is taking all prescriptions listed in her charts. Only a few are prescribed by her current pcp and the rest are previous providers. She is requesting for her provider to renew these prescriptions for her. I did advise the pt that she may have to be seen to have these prescriptions renewed but she states she just seen her pcp in January. Please advise

## 2024-02-15 ENCOUNTER — Ambulatory Visit: Admitting: Psychiatry

## 2024-02-15 DIAGNOSIS — F4323 Adjustment disorder with mixed anxiety and depressed mood: Secondary | ICD-10-CM | POA: Diagnosis not present

## 2024-02-15 DIAGNOSIS — F4010 Social phobia, unspecified: Secondary | ICD-10-CM | POA: Diagnosis not present

## 2024-02-15 MED ORDER — OXYBUTYNIN CHLORIDE 5 MG PO TABS: 5.0000 mg | ORAL_TABLET | Freq: Two times a day (BID) | ORAL | 0 refills | Status: DC

## 2024-02-15 NOTE — Addendum Note (Signed)
 Addended by: Marinus Maw on: 02/15/2024 08:28 AM   Modules accepted: Orders

## 2024-02-15 NOTE — Progress Notes (Signed)
 [facility] Counselor/Therapist Progress Note  Patient ID: Angela Mahoney, MRN: [id number],    Date: 02/15/2024  Time Spent: 50 minutes   Treatment Type: Individual Therapy  Reported Symptoms: anxiety, depression, grief  Mental Status Exam:  Appearance:   Casual     Behavior:  Appropriate, Sharing, and Motivated  Motor:  Uses rollator  Speech/Language:   Clear and Coherent  Affect:  Depressed and anxious  Mood:  anxious  Thought process:  goal directed  Thought content:    Rumination  Sensory/Perceptual disturbances:    WNL  Orientation:  oriented to person, place, time/date, situation, day of week, month of year, year, and stated date of February 15, 2024  Attention:  Fair  Concentration:  Fair  Memory:  Reporting memory issues and plan to have PCP send me to a neurologist  Fund of knowledge:   Good and Fair  Insight:    Good and Fair  Judgment:   Good and Fair  Impulse Control:  Good and Fair   Risk Assessment: Danger to Self:  No Self-injurious Behavior: No Danger to Others: No Duty to Warn:no Physical Aggression / Violence:No  Access to Firearms a concern: No  Gang Involvement:No   Subjective:  Patient today reports symptoms of anxiety, depression, sadness, losses of niece and girlfriend and has difficulty with their anniversary dates. Old grief that has been hard to let go of. Husband is helpful and supportive. Stated today that she needed to process grief, "old grief" regarding the death of her mother in March 15, 2012. Misses her mom and her daily reports of the news and all the activities she had participated in that day. I miss that mom has missed the great-grandchildren being born. Shares that she joined [employer] recently and has lost 5 lbs and participating at their gym along with her husband. Continues friendship with a 65 yr old female neighbor which feels good to her. Not many friends ad reports they tend to be home-bodies. Supporting husband with his  Parkinson's. States I feel like I'm doing ok but I want to come here and get better at dealing with life stressors including husband's Parkinson's, not having many friends, and social anxiety.  Encouraged patient even in taking small steps regarding trying to move beyond her social anxiety and manage it in healthier ways to where she can eventually be more comfortable and getting out more around other people.  States she will practice some small steps in between sessions this time.  Interventions: Cognitive Behavioral Therapy and Ego-Supportive 1 identify major life conflicts from her past and present that form the basis for present anxiety and depression. 2.  Develop cognitive and behavioral strategies to reduce or eliminate her excessive anxiety and depressive symptomology. 3 identify, challenge, and replace fearful and anxious self talk with more positive, realistic, and empowering self talk. 4.  Help patient to develop healthy self talk as a means of helping to manage her anxiety and depression. 5.  Encouraged patient to increase daily social experiences and to work on strengthening a nonavoidant approach and build self-confidence. 6.  Assist patient in developing reality based, positive, cognitive messages.   Diagnosis:   ICD-10-CM   1. Social anxiety disorder  F40.10      Plan:   Patient today working further on her anxiety, depression, social anxiety, and some sadness regarding family situations.  She is showing some motivation and I encourage her on this as well as using cognitive and behavioral strategies in  working on her social anxiety in order to feel more comfortable and getting out of the house more, which she reports she has done some more recently.  Encouraged her work to try and identify, challenge, and replace fearful anxious self talk with more realistic and positive self talk.  Patient encouraged to try small steps towards some of her goals, to hopefully notice some progress,  without being overwhelmed.  Patient is working to make progress and needs to continue working with goal-directed behaviors in order to move in a more hopeful and healthier direction.  Goal review and progress/challenges noted with patient.  Next appointment within 2 to 3 weeks.   Mathis Fare, LCSW

## 2024-02-15 NOTE — Telephone Encounter (Signed)
 Rx sent

## 2024-02-16 ENCOUNTER — Ambulatory Visit: Admitting: Family Medicine

## 2024-02-17 DIAGNOSIS — Z1231 Encounter for screening mammogram for malignant neoplasm of breast: Secondary | ICD-10-CM | POA: Diagnosis not present

## 2024-02-17 DIAGNOSIS — Z6841 Body Mass Index (BMI) 40.0 and over, adult: Secondary | ICD-10-CM | POA: Diagnosis not present

## 2024-02-17 DIAGNOSIS — N958 Other specified menopausal and perimenopausal disorders: Secondary | ICD-10-CM | POA: Diagnosis not present

## 2024-02-17 DIAGNOSIS — Z01419 Encounter for gynecological examination (general) (routine) without abnormal findings: Secondary | ICD-10-CM | POA: Diagnosis not present

## 2024-02-17 DIAGNOSIS — K219 Gastro-esophageal reflux disease without esophagitis: Secondary | ICD-10-CM | POA: Diagnosis not present

## 2024-02-17 DIAGNOSIS — Z13820 Encounter for screening for osteoporosis: Secondary | ICD-10-CM | POA: Diagnosis not present

## 2024-02-17 LAB — HM MAMMOGRAPHY

## 2024-02-17 LAB — HM DEXA SCAN: HM Dexa Scan: NORMAL

## 2024-02-23 ENCOUNTER — Other Ambulatory Visit: Payer: Self-pay | Admitting: Family Medicine

## 2024-02-23 NOTE — Telephone Encounter (Signed)
 This will be her 1st fill w you

## 2024-02-23 NOTE — Telephone Encounter (Unsigned)
 Copied from CRM [id number]. Topic: Clinical - Medication Refill >> Feb 23, 2024  4:29 PM Godfrey Pick wrote: Most Recent Primary Care Visit:  Provider: Avanell Shackleton  Department: [facility]  Visit Type: OFFICE VISIT  Date: 01/12/2024  Medication: gabapentin (NEURONTIN) 300 MG capsule  Has the patient contacted their pharmacy? Yes (Agent: If no, request that the patient contact the pharmacy for the refill. If patient does not wish to contact the pharmacy document the reason why and proceed with request.) (Agent: If yes, when and what did the pharmacy advise?)  Is this the correct pharmacy for this prescription? Yes If no, delete pharmacy and type the correct one.  This is the patient's preferred pharmacy:  [facility] [id number] [place] - [address] [place] [address] Phone: [phone] Fax: [phone]   Has the prescription been filled recently? No  Is the patient out of the medication? No  Has the patient been seen for an appointment in the last year OR does the patient have an upcoming appointment? Yes  Can we respond through MyChart? No  Agent: Please be advised that Rx refills may take up to 3 business days. We ask that you follow-up with your pharmacy.

## 2024-02-24 ENCOUNTER — Other Ambulatory Visit: Payer: Self-pay | Admitting: Family Medicine

## 2024-02-24 NOTE — Telephone Encounter (Signed)
 Filled by old PCP, 1st fill w you

## 2024-02-28 ENCOUNTER — Ambulatory Visit (INDEPENDENT_AMBULATORY_CARE_PROVIDER_SITE_OTHER): Admitting: Family Medicine

## 2024-02-28 ENCOUNTER — Encounter: Payer: Self-pay | Admitting: Family Medicine

## 2024-02-28 VITALS — BP 128/80 | HR 90 | Temp 98.3°F | Ht 64.0 in | Wt 249.0 lb

## 2024-02-28 DIAGNOSIS — Z23 Encounter for immunization: Secondary | ICD-10-CM | POA: Diagnosis not present

## 2024-02-28 DIAGNOSIS — F331 Major depressive disorder, recurrent, moderate: Secondary | ICD-10-CM

## 2024-02-28 DIAGNOSIS — N3946 Mixed incontinence: Secondary | ICD-10-CM

## 2024-02-28 DIAGNOSIS — R7303 Prediabetes: Secondary | ICD-10-CM | POA: Diagnosis not present

## 2024-02-28 DIAGNOSIS — M797 Fibromyalgia: Secondary | ICD-10-CM | POA: Diagnosis not present

## 2024-02-28 DIAGNOSIS — E7800 Pure hypercholesterolemia, unspecified: Secondary | ICD-10-CM | POA: Diagnosis not present

## 2024-02-28 DIAGNOSIS — I252 Old myocardial infarction: Secondary | ICD-10-CM | POA: Diagnosis not present

## 2024-02-28 DIAGNOSIS — I2510 Atherosclerotic heart disease of native coronary artery without angina pectoris: Secondary | ICD-10-CM

## 2024-02-28 DIAGNOSIS — F419 Anxiety disorder, unspecified: Secondary | ICD-10-CM

## 2024-02-28 LAB — COMPREHENSIVE METABOLIC PANEL WITH GFR
ALT: 21 U/L (ref 0–35)
AST: 25 U/L (ref 0–37)
Albumin: 4.2 g/dL (ref 3.5–5.2)
Alkaline Phosphatase: 64 U/L (ref 39–117)
BUN: 10 mg/dL (ref 6–23)
CO2: 26 meq/L (ref 19–32)
Calcium: 9.1 mg/dL (ref 8.4–10.5)
Chloride: 108 meq/L (ref 96–112)
Creatinine, Ser: 0.85 mg/dL (ref 0.40–1.20)
GFR: 72.05 mL/min (ref >60.00)
Glucose, Bld: 86 mg/dL (ref 70–99)
Potassium: 3.8 meq/L (ref 3.5–5.1)
Sodium: 141 meq/L (ref 135–145)
Total Bilirubin: 0.3 mg/dL (ref 0.2–1.2)
Total Protein: 6.7 g/dL (ref 6.0–8.3)

## 2024-02-28 LAB — CBC
HCT: 40.5 % (ref 36.0–46.0)
Hemoglobin: 13.1 g/dL (ref 12.0–15.0)
MCHC: 32.4 g/dL (ref 30.0–36.0)
MCV: 89.3 fl (ref 78.0–100.0)
Platelets: 205 10*3/uL (ref 150.0–400.0)
RBC: 4.54 Mil/uL (ref 3.87–5.11)
RDW: 14.9 % (ref 11.5–15.5)
WBC: 4.8 10*3/uL (ref 4.0–10.5)

## 2024-02-28 LAB — TSH: TSH: 1.58 u[IU]/mL (ref 0.35–5.50)

## 2024-02-28 LAB — HEMOGLOBIN A1C: Hgb A1c MFr Bld: 5.8 % (ref 4.6–6.5)

## 2024-02-28 MED ORDER — GABAPENTIN 300 MG PO CAPS: ORAL_CAPSULE | ORAL | 0 refills | Status: DC

## 2024-02-28 NOTE — Assessment & Plan Note (Signed)
 Continue current medication regimen. Referral to psychiatrist and to counselor.

## 2024-02-28 NOTE — Assessment & Plan Note (Signed)
 Continue buspirone 15 mg 3 times daily, duloxetine 120 mg daily, and topiramate 50 mg nightly. Referral to establish with new psychiatrist

## 2024-02-28 NOTE — Assessment & Plan Note (Signed)
 Continue current medications.

## 2024-02-28 NOTE — Assessment & Plan Note (Signed)
 Follow with cardiology as recommended.

## 2024-02-28 NOTE — Assessment & Plan Note (Signed)
 Check A1c and follow up. She also is followed by [employer] with [facility].

## 2024-02-28 NOTE — Assessment & Plan Note (Signed)
 No improvement in symptoms on oxybutynin bid. Referral to urology.

## 2024-02-28 NOTE — Progress Notes (Signed)
 Subjective:     Patient ID: Angela Mahoney, female    DOB: 1961-04-29, 63 y.o.   MRN: [id number]  Chief Complaint  Patient presents with   Medical Management of Chronic Issues    4 week f/u    HPI  History of Present Illness          Other providers: Cardiologist- Dr. Jodie Echevaria [facility]- Dr. Quintin Alto pulmonology in the past.  Psychiatrist- needs a new one that takes  Counselor- can no longer see them due to new insurance  OB/GYN- [facility] (mammogram and DEXA) Dr. Renaldo Fiddler    States she gets Cymbalta, buspirone and Wellbutrin with psychiatrist however, she is in need of a new psychiatrist who accepts her insurance.  States she also needs a therapist who can see her every 2 weeks. States her current therapist at [facility] can only see her every 3-4 weeks.   Fibromyalgia - takes gabapentin 900 in the AM, 600 mg in the PM, 600 mg at bedtime   Endocrinologist prescribes her Florinef for hypotension.   States COPD in remission. Hx of lung cancer screening done.   [facility] for oxybutynin, takes it twice daily  Urinary incontinence is with stress but other times as well. She denies seeing urologist in the past and states medication does not work that well. She wears a pad all the time. Would like referral to urologist.     Health Maintenance Due  Topic Date Due   HIV Screening  Never done   Hepatitis C Screening  Never done   DTaP/Tdap/Td (1 - Tdap) Never done   Zoster Vaccines- Shingrix (1 of 2) Never done   Lung Cancer Screening  Never done    Past Medical History:  Diagnosis Date   Anemia    Anxiety    Arthritis    Back pain    Bilateral swelling of feet    Chest pain    Chronic fatigue syndrome    COPD (chronic obstructive pulmonary disease) (HCC) 2017   Coronary artery disease    Roxanne Mins, [place]   Depression    Dyspnea    with exertion   Falls    Fibromyalgia    GERD (gastroesophageal reflux disease)    Headache    High  cholesterol    History of appendectomy 10/26/2022   History of fainting spells of unknown cause    ? 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   Prediabetes 10/06/2023   Rheumatoid arthritis (HCC)    Seasonal allergies    Sleep apnea    does not use cpap   SOB (shortness of breath)    Vitamin D deficiency     Past Surgical History:  Procedure Laterality Date   BUNIONECTOMY Right 2002   CARDIAC CATHETERIZATION  01/23/2016   In [place]   COLONOSCOPY     x 2 at ages 30yr and 72yr - [place]   EXTERNAL FIXATION REMOVAL Right 08/06/2021   Procedure: REMOVAL EXTERNAL FIXATION LEG;  Surgeon: Roby Lofts, MD;  Location: [facility] OR;  Service: Orthopedics;  Laterality: Right;  zimmer biomet ex-fix removal   LAPAROSCOPIC GASTRIC SLEEVE RESECTION  2017   [place]   MULTIPLE TOOTH EXTRACTIONS     has upper and lower dentures  ORIF ANKLE FRACTURE Right 08/06/2021   Procedure: OPEN REDUCTION INTERNAL FIXATION (ORIF) ANKLE FRACTURE;  Surgeon: Laneta Pintos, MD;  Location: [facility] OR;  Service: Orthopedics;  Laterality: Right;  smith and nephew   SYNDESMOSIS REPAIR Right 12/15/2021   Procedure: SYNDESMOSIS REPAIR;  Surgeon: Laneta Pintos, MD;  Location: [facility] OR;  Service: Orthopedics;  Laterality: Right;    Family History  Problem Relation Age of Onset   Cancer Mother    Heart disease Mother    Hyperlipidemia Mother    Alcoholism Mother    Heart disease Brother     Social History   Socioeconomic History   Marital status: Married    Spouse name: Josefina Nian   Number of children: 1   Years of education: Not on file   Highest education level: Not on file  Occupational History   Occupation: Disabled  Tobacco Use   Smoking status: Former    Current packs/day: 0.00    Average packs/day: 0.5 packs/day  for 45.0 years (22.5 ttl pk-yrs)    Types: Cigarettes    Start date: 21    Quit date: 2014    Years since quitting: 11.2   Smokeless tobacco: Never  Vaping Use   Vaping status: Never Used  Substance and Sexual Activity   Alcohol use: Never   Drug use: Never   Sexual activity: Not Currently  Other Topics Concern   Not on file  Social History Narrative   Lives with her husband. Has a dog   Social Drivers of Corporate investment banker Strain: Low Risk  (01/05/2024)   Received [facility]   Overall Financial Resource Strain (CARDIA)    Difficulty of Paying Living Expenses: Not very hard  Food Insecurity: No Food Insecurity (01/05/2024)   [facility] from [facility]   Hunger Vital Sign    Worried About Running Out of Food in the Last Year: Never true    Ran Out of Food in the Last Year: Never true  Transportation Needs: No Transportation Needs (01/05/2024)   Received from [facility] - Transportation    Lack of Transportation (Medical): No    Lack of Transportation (Non-Medical): No  Physical Activity: Insufficiently Active (01/05/2024)   [facility] from [facility]   Exercise Vital Sign    Days of Exercise per Week: 1 day    Minutes of Exercise per Session: 20 min  Stress: Stress Concern Present (01/05/2024)   Received from [facility] - Occupational Stress Questionnaire    Feeling of Stress : To some extent  Social Connections: Socially Isolated (01/05/2024)   Received from [facility]   Social Network    How would you rate your social network (family, work, friends)?: Little participation, lonely and socially isolated  Intimate Partner Violence: Not At Risk (01/05/2024)   Received from [facility]   HITS    Over the last 12 months how often did your partner physically hurt you?: Never    Over the last 12 months how often did your partner insult you or talk down to you?: Never    Over the last 12 months  how often did your partner threaten you with physical harm?: Never    Over the last 12 months how often did your partner scream or curse at you?: Never    Outpatient Medications Prior to Visit  Medication Sig Dispense Refill   albuterol (VENTOLIN HFA) 108 (90 Base) MCG/ACT inhaler Inhale  1-2 puffs into the lungs every 6 (six) hours as needed for shortness of breath or wheezing.     Ascorbic Acid (VITAMIN C-ROSE HIPS CR) 1500 MG TBCR Take 1,500 mg by mouth daily.     atorvastatin (LIPITOR) 80 MG tablet Take 1 tablet (80 mg total) by mouth daily with supper. 90 tablet 1   b complex vitamins capsule Take 1 capsule by mouth daily.     Baclofen 5 MG TABS Take 1 tablet (5 mg total) by mouth at bedtime. 90 tablet 0   buPROPion (WELLBUTRIN XL) 150 MG 24 hr tablet Take 1 tablet (150 mg total) by mouth every morning. 30 tablet 2   busPIRone (BUSPAR) 15 MG tablet Take 15 mg by mouth 3 (three) times daily.     Calcium Citrate-Vitamin D (CALCIUM CITRATE + D PO) Take 1 tablet by mouth daily.     DULoxetine (CYMBALTA) 60 MG capsule Take 2 capsules (120 mg total) by mouth at bedtime.  3   famotidine (PEPCID) 10 MG tablet Take 10 mg by mouth daily with breakfast.     fludrocortisone (FLORINEF) 0.1 MG tablet Take 0.2 mg by mouth daily.     fluticasone (FLONASE) 50 MCG/ACT nasal spray Place 1 spray into both nostrils daily as needed for allergies or rhinitis.     loratadine (CLARITIN) 10 MG tablet Take by mouth.     Multiple Vitamin (MULTIVITAMIN WITH MINERALS) TABS tablet Take 1 tablet by mouth daily.     ondansetron (ZOFRAN-ODT) 4 MG disintegrating tablet Take 1 tablet by mouth every 8 (eight) hours as needed for nausea/vomiting.     oxybutynin (DITROPAN) 5 MG tablet Take 1 tablet (5 mg total) by mouth 2 (two) times daily. 180 tablet 0   pantoprazole (PROTONIX) 40 MG tablet Take 1 tablet (40 mg total) by mouth daily. 30 tablet 5   topiramate (TOPAMAX) 50 MG tablet TAKE ONE TABLET BY MOUTH ONE TIME DAILY 90  tablet 0   gabapentin (NEURONTIN) 300 MG capsule take 3 capsules by mouth in the morning, 2 capsules in the afternoon and 2 capsules at night 150 capsule 0   B Complex-Biotin-FA (B-COMPLEET-100) TABS      Semaglutide,0.25 or 0.5MG /DOS, (OZEMPIC, 0.25 OR 0.5 MG/DOSE,) 2 MG/3ML SOPN Inject 0.25 mg into the skin once a week. 3 mL 0   No facility-administered medications prior to visit.    Allergies  Allergen Reactions   Zolpidem Swelling    Tongue swelling, thrush symptoms.    Review of Systems  Constitutional:  Negative for chills, fever, malaise/fatigue and weight loss.  Respiratory:  Negative for shortness of breath.   Cardiovascular:  Negative for chest pain, palpitations and leg swelling.  Gastrointestinal:  Negative for abdominal pain, constipation, diarrhea, nausea and vomiting.  Genitourinary:  Negative for dysuria, frequency and urgency.  Musculoskeletal:  Positive for joint pain and myalgias.  Neurological:  Negative for dizziness and focal weakness.       Objective:    Physical Exam Constitutional:      General: She is not in acute distress.    Appearance: She is not ill-appearing.  Eyes:     Extraocular Movements: Extraocular movements intact.     Conjunctiva/sclera: Conjunctivae normal.  Cardiovascular:     Rate and Rhythm: Normal rate.  Pulmonary:     Effort: Pulmonary effort is normal.  Musculoskeletal:     Cervical back: Normal range of motion and neck supple.  Skin:    General: Skin is warm and dry.  Neurological:     General: No focal deficit present.     Mental Status: She is alert and oriented to person, place, and time.  Psychiatric:        Mood and Affect: Mood normal.        Behavior: Behavior normal.        Thought Content: Thought content normal.      BP 128/80 (BP Location: Left Arm, Patient Position: Sitting)   Pulse 90   Temp 98.3 F (36.8 C) (Temporal)   Ht 5\' 4"  (1.626 m)   Wt 249 lb (112.9 kg)   SpO2 97%   BMI 42.74 kg/m  Wt  Readings from Last 3 Encounters:  02/28/24 249 lb (112.9 kg)  01/12/24 248 lb (112.5 kg)  12/09/23 248 lb (112.5 kg)       Assessment & Plan:   Problem List Items Addressed This Visit     Anxiety   Continue buspirone 15 mg 3 times daily, duloxetine 120 mg daily, and topiramate 50 mg nightly. Referral to establish with new psychiatrist       Relevant Orders   Ambulatory referral to Psychiatry   CAD (coronary artery disease)   Fibromyalgia   Continue current medications.       Relevant Medications   gabapentin (NEURONTIN) 300 MG capsule   Old myocardial infarction   Follow with cardiology as recommended.       Prediabetes - Primary   Check A1c and follow up. She also is followed by [facility].       Relevant Orders   CBC (Completed)   Comprehensive metabolic panel with GFR (Completed)   Hemoglobin A1c (Completed)   TSH (Completed)   Pure hypercholesterolemia   Recurrent major depressive episodes, moderate (HCC)   Continue current medication regimen. Referral to psychiatrist and to counselor.       Relevant Orders   Ambulatory referral to Psychiatry   Urinary incontinence, stress and overactive   No improvement in symptoms on oxybutynin bid. Referral to urology.       Relevant Orders   Ambulatory referral to Urology   Other Visit Diagnoses       Need for pneumococcal 20-valent conjugate vaccination       Relevant Orders   Pneumococcal conjugate vaccine 20-valent (Prevnar 20) (Completed)       I have discontinued Kymberlie A. Brunty "Deb"'s B-Compleet-100 and Ozempic (0.25 or 0.5 MG/DOSE). I am also having her maintain her albuterol, ondansetron, Calcium Citrate-Vitamin D (CALCIUM CITRATE + D PO), multivitamin with minerals, fluticasone, fludrocortisone, busPIRone, famotidine, VITAMIN C-ROSE HIPS CR, DULoxetine, loratadine, buPROPion, atorvastatin, topiramate, pantoprazole, Baclofen, oxybutynin, b complex vitamins, and gabapentin.  Meds ordered  this encounter  Medications   gabapentin (NEURONTIN) 300 MG capsule    Sig: Take 3 capsules by mouth in the morning, 2 capsules in the afternoon and 2 capsules at night    Dispense:  630 capsule    Refill:  0    Supervising Provider:   Bambi Lever A [[id number]]

## 2024-03-02 ENCOUNTER — Ambulatory Visit: Admitting: Psychiatry

## 2024-03-07 ENCOUNTER — Other Ambulatory Visit: Payer: Self-pay | Admitting: Family Medicine

## 2024-03-07 ENCOUNTER — Ambulatory Visit (INDEPENDENT_AMBULATORY_CARE_PROVIDER_SITE_OTHER): Admitting: Psychiatry

## 2024-03-07 DIAGNOSIS — F4010 Social phobia, unspecified: Secondary | ICD-10-CM

## 2024-03-07 DIAGNOSIS — E7849 Other hyperlipidemia: Secondary | ICD-10-CM

## 2024-03-07 DIAGNOSIS — F4323 Adjustment disorder with mixed anxiety and depressed mood: Secondary | ICD-10-CM

## 2024-03-07 MED ORDER — ATORVASTATIN CALCIUM 80 MG PO TABS: 80.0000 mg | ORAL_TABLET | Freq: Every day | ORAL | 1 refills | Status: DC

## 2024-03-07 NOTE — Progress Notes (Signed)
 [facility] Counselor/Therapist Progress Note  Patient ID: Angela Mahoney, MRN: [id number],    Date: 03/07/2024  Time Spent: 53 minutes   Treatment Type: Individual Therapy  Reported Symptoms: anxiety, depression "mostly related to grief re: deaths of friends in past few years"    Mental Status Exam:  Appearance:   Casual     Behavior:  Appropriate, Sharing, and Motivated  Motor:  Uses walker and does well  Speech/Language:   Clear and Coherent  Affect:  Depressed  Mood:  anxious and depressed  Thought process:  goal directed  Thought content:    Rumination  Sensory/Perceptual disturbances:    WNL  Orientation:  oriented to person, place, time/date, situation, day of week, month of year, year, and stated date of March 07, 2024  Attention:  Fair  Concentration:  Good and Fair  Memory:  WNL (but some challenges)  Fund of knowledge:   Good and Fair  Insight:    Good and Fair  Judgment:   Good and Fair  Impulse Control:  Good and Fair   Risk Assessment: Danger to Self:  No Self-injurious Behavior: No Danger to Others: No Duty to Warn:no Physical Aggression / Violence:No  Access to Firearms a concern: No  Gang Involvement:No   Subjective:   Patient in session today seen on her anxiety, depression, sadness and losses of niece and girlfriend and particularly has "trouble with their anniversary dates".  Also reporting continued "old grief" that she is finding hard to let go of.  Positive for patient is that her husband remains helpful and supportive. "Social anxiety" continues to be a problem but patient states she is wanting to work on something else today and shares that her 64 yr old daughter who is in counseling herself often tells patient what she should or should not do. But patient wanting to make decisions on her own. Feels her old grief re: mom's death in 2012-04-02 is still an unresolved issue and I recall her drinking regularly. Patient feels good that she herself  has not following mom's path of excessive drinking. Patient saying today that she appreciates the help she is getting, but is needing weekly appts, which this therapist is not able to provide at this time.  Discussed this at more length with patient and helped her in understanding process to locate a different therapist and how to check with them about their being able to provide weekly sessions instead of biweekly.  Patient asked if I would continue to work with her until she can get connected with a therapist that can do the weekly sessions and I have assured her that we will do that so that it can be a smooth transition for her.  Patient continues her support of husband who has Parkinson's disease however is in earlier phase of that and seems to do pretty well in his walking and moving about currently as he comes to patient sessions and sits out in the lobby while she is being seen.  Encouraged patient on ways of meeting and making new friends which seems would be helpful to her and told her we could talk about this further at her next session within 2 weeks.  Supported patient in taking some small steps and trying to work beyond some of her social anxiety and not let it prevent her from initiating contact with other people and developing some friendships that could be helpful to patient and going forward..  Interventions: Cognitive Behavioral Therapy, Solution-Oriented/Positive  Psychology, and Ego-Supportive  1 Identify major life conflicts from her past and present that form the basis for present anxiety and depression. 2. Develop cognitive and behavioral strategies to reduce or eliminate her excessive anxiety and depressive symptomology. 3 Identify, challenge, and replace fearful and anxious self talk with more positive, realistic, and empowering self talk. 4.  Help patient to develop healthy self talk as a means of helping to manage her anxiety and depression. 5.  Encouraged patient to increase daily  social experiences and to work on strengthening a nonavoidant approach and build self-confidence. 6.  Assist patient in developing reality based, positive, cognitive messages.  Diagnosis:   ICD-10-CM   1. Social anxiety disorder  F40.10      Plan: Patient in session today and we worked further on her anxiety/social anxiety, some sadness regarding family situations, and depression.  Some difficulty in following through on goal-directed behaviors between sessions but does work in session on them, explaining today she had not had a lot of opportunities as she had not been around very many people, other than her husband, since last session.  I encouraged patient to try working with some of the steps we spoke about in session that could help her in connecting with other people even though it may not feel real comfortable initially because of her reported anxiety.  In sessions here, I pointed out to her that she is doing very well and being open, and asking questions, and not being hesitant at all to speak up and share her thoughts and feelings.  I supported her to continue these efforts and encouraged her trying to identify, challenge, and replace fearful anxious thoughts and self talk, with more realistic and positive self talk.  Did encourage her to work on this some in between sessions and that taking small steps and working on goal-directed behaviors is positive, that it does not have to be huge steps.  [name] is making some progress initially and is being encouraged to continue her work with goal-directed behaviors in order to move in a more hopeful and healthier direction into the future.  Goal review and progress/challenges noted with patient.  Next appointment within 2 to 3 weeks.   Kelleen Patee, LCSW

## 2024-03-07 NOTE — Telephone Encounter (Signed)
 Copied from CRM [id number]. Topic: Clinical - Medication Refill >> Mar 07, 2024  8:23 AM Allyne Areola wrote: Most Recent Primary Care Visit:  Provider: HENSON, VICKIE L  Department: [facility]  Visit Type: OFFICE VISIT  Date: 02/28/2024  Medication: atorvastatin  (LIPITOR ) 80 MG tablet   Has the patient contacted their pharmacy? No, Jimmye Moulds from [facility] is calling to place the request  (Agent: If no, request that the patient contact the pharmacy for the refill. If patient does not wish to contact the pharmacy document the reason why and proceed with request.) (Agent: If yes, when and what did the pharmacy advise?)  Is this the correct pharmacy for this prescription? Yes If no, delete pharmacy and type the correct one.  This is the patient's preferred pharmacy:  [facility] [address], [place] - [address] [place] [address] Phone: [phone] Fax: [phone]   Has the prescription been filled recently? No  Is the patient out of the medication? Unsure   Has the patient been seen for an appointment in the last year OR does the patient have an upcoming appointment? Yes  Can we respond through MyChart? Yes  Agent: Please be advised that Rx refills may take up to 3 business days. We ask that you follow-up with your pharmacy.

## 2024-03-14 DIAGNOSIS — R350 Frequency of micturition: Secondary | ICD-10-CM | POA: Diagnosis not present

## 2024-03-14 DIAGNOSIS — Z6841 Body Mass Index (BMI) 40.0 and over, adult: Secondary | ICD-10-CM | POA: Diagnosis not present

## 2024-03-14 DIAGNOSIS — R351 Nocturia: Secondary | ICD-10-CM | POA: Diagnosis not present

## 2024-03-14 DIAGNOSIS — N3944 Nocturnal enuresis: Secondary | ICD-10-CM | POA: Diagnosis not present

## 2024-03-14 DIAGNOSIS — E66813 Obesity, class 3: Secondary | ICD-10-CM | POA: Diagnosis not present

## 2024-03-16 ENCOUNTER — Ambulatory Visit: Admitting: Psychology

## 2024-03-16 ENCOUNTER — Encounter: Payer: Self-pay | Admitting: Psychology

## 2024-03-16 DIAGNOSIS — F4010 Social phobia, unspecified: Secondary | ICD-10-CM | POA: Diagnosis not present

## 2024-03-16 DIAGNOSIS — F4321 Adjustment disorder with depressed mood: Secondary | ICD-10-CM

## 2024-03-16 DIAGNOSIS — F331 Major depressive disorder, recurrent, moderate: Secondary | ICD-10-CM

## 2024-03-16 NOTE — Progress Notes (Signed)
 [facility] Behavioral Health Counselor Initial Adult Exam  Name: Angela Mahoney Date: 03/16/2024 MRN: [id number] DOB: 09-28-1961 PCP: Abram Abraham, NP-C  Time spent: 9:01am-10:02am  pt is seen for in person visit.   Guardian/Payee:  self    Paperwork requested: No   Reason for Visit /Presenting Problem:  Pt is referred for counseling of depression, anxiety and grief.  Pt reports on medications for depression and anxiety that PCP prescribes.  Pt has been in counseling w/ 2 providers in the area that weren't able to see frequently.  Pt reports had been in counseling previously in [place]  and [place] .  Pt reports stressors as multiple losses, husband's parkinson's and feeling that relationship w/ daughter is strained.  Mental Status Exam: Appearance:   Well Groomed     Behavior:  Appropriate  Motor:  Normal  Speech/Language:   Clear and Coherent  Affect:  Appropriate  Mood:  anxious and depressed  Thought process:  normal  Thought content:    WNL  Sensory/Perceptual disturbances:    WNL  Orientation:  oriented to person, place, time/date, and situation  Attention:  Good  Concentration:  Good  Memory:  WNL  Fund of knowledge:   Good  Insight:    Good  Judgment:   Good  Impulse Control:  Good   Reported Symptoms:  Pt reports depressed moods, anxiety and grief.  Pt reports fatigue and struggles w/ sleeping at night.  Pt reports napping 2-3 times during the day.  Pt reports excessive worry, feeling on edge, difficulty relaxing.  Pt reports deals w/ social anxiety and has impacted as doesn't want to engage outside of house and hasn't made any friends.  Pt scored 13 on GAD 7.  Pt scored 14 on PHQ9.    Risk Assessment: Danger to Self:  No Self-injurious Behavior: No Danger to Others: No Duty to Warn:no Physical Aggression / Violence:No  Access to Firearms a concern: No  Gang Involvement:No  Patient / guardian was educated about steps to take if suicide or homicide risk level  increases between visits: yes While future psychiatric events cannot be accurately predicted, the patient does not currently require acute inpatient psychiatric care and does not currently meet [place]  involuntary commitment criteria.  Substance Abuse History: Current substance abuse: No     Past Psychiatric History:   Previous psychological history is significant for anxiety and depression Outpatient Providers:Pt reports has been in counseling for years.  Pt reports last good experience when in [place] .  Pt reports that most recently seeing Deb at [facility] but she isn't able to see more frequently.   History of Psych Hospitalization: Yes Pt reports one time for 72 hours when had SI in [place] . Psychological Testing:  none    Abuse History:  Victim of: No.,  none    Report needed: No. Victim of Neglect:No. Perpetrator of  none   Witness / Exposure to Domestic Violence: No   Protective Services Involvement: No  Witness to [employer] Violence:  No   Family History:  Family History  Problem Relation Age of Onset   Cancer Mother    Heart disease Mother    Hyperlipidemia Mother    Alcoholism Mother    Alcoholism Father    Heart disease Brother   Pt reports she grew up oldest w/ 4 younger brothers.  Pt reports father was alcoholic and died at age 41y/o from complications of his alcoholism.  Pt reports mom began drinking more when she was in middle school,  but stopped after her father died.  Pt reports dad in army so moved around a lot- born in [place],  [place], lived in [place], lived in [place] , and [place]  [place].  Pt reports they always returned to [place] as that was where dad was from.   Pt mom died in 04-01-2012. Pt reports a lot of anger and hurt related to her death.    Living situation: the patient lives with her spouse and their 2 dogs- Bella and MaeMae- in [place] in [place], [place].  They moved her in Nov 2020.    Sexual Orientation: Straight  Relationship Status: married.  Pt  reports they met when she was 15y/o.  They moved in together and married 4 years later.  Pt reports he just turned 63 y/o and has Parkinson's that is progressing.  First dx when in [place] .   Name of spouse / other:Jerry If a parent, number of children / ages:1 daughter, Glenora Laos.  Pt had her at 13y/o.  Pt husband had 2 kids from previous marriage and they visited on summers and breaks.    Support Systems: Pt reports husband is her support.  [place] and AA w/ husband.  Pt reports that relationship w/ daughter is falling apart.    Financial Stress:  Yes   Income/Employment/Disability: Neurosurgeon: No   Educational History: Education: high school diploma/GED  Religion/Sprituality/World View: Not reported  Any cultural differences that may affect / interfere with treatment:  not applicable   Recreation/Hobbies:  crocheting  Stressors: Health problems   Other: Deaths/losses, husband's health and relationship w/ daughter    Strengths: seeking counseling  Barriers:  health issues/fatigue   Legal History: Pending legal issue / charges: The patient has no significant history of legal issues. History of legal issue / charges:  none  Medical History/Surgical History: reviewed Past Medical History:  Diagnosis Date   Anemia    Anxiety    Arthritis    Back pain    Bilateral swelling of feet    Chest pain    Chronic fatigue syndrome    COPD (chronic obstructive pulmonary disease) (HCC) 04/01/2016   Coronary artery disease    Lorrain Rosenthal, PA   Depression    Dyspnea    with exertion   Falls    Fibromyalgia    GERD (gastroesophageal reflux disease)    Headache    High cholesterol    History of appendectomy 10/26/2022   History of fainting spells of unknown cause    ? 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   Prediabetes 10/06/2023   Rheumatoid arthritis (HCC)    Seasonal allergies    Sleep apnea    does not use cpap   SOB (shortness of breath)    Vitamin D  deficiency     Past Surgical History:  Procedure Laterality Date   BUNIONECTOMY Right 04/01/2001   CARDIAC CATHETERIZATION  01/23/2016   In [place]   COLONOSCOPY     x 2 at ages 30yr and 16yr - [place]   EXTERNAL FIXATION REMOVAL Right 08/06/2021   Procedure: REMOVAL EXTERNAL FIXATION LEG;  Surgeon: Laneta Pintos, MD;  Location: [facility] OR;  Service: Orthopedics;  Laterality: Right;  zimmer biomet ex-fix removal   LAPAROSCOPIC GASTRIC SLEEVE RESECTION  2016/04/01  [place]   MULTIPLE TOOTH EXTRACTIONS     has upper and lower dentures   ORIF ANKLE FRACTURE Right 08/06/2021   Procedure: OPEN REDUCTION INTERNAL FIXATION (ORIF) ANKLE FRACTURE;  Surgeon: Laneta Pintos, MD;  Location: [facility] OR;  Service: Orthopedics;  Laterality: Right;  smith and nephew   SYNDESMOSIS REPAIR Right 12/15/2021   Procedure: SYNDESMOSIS REPAIR;  Surgeon: Laneta Pintos, MD;  Location: [facility] OR;  Service: Orthopedics;  Laterality: Right;    Medications: Current Outpatient Medications  Medication Sig Dispense Refill   buPROPion  (WELLBUTRIN  XL) 150 MG 24 hr tablet Take 1 tablet (150 mg total) by mouth every morning. 30 tablet 2   DULoxetine  (CYMBALTA ) 60 MG capsule Take 2 capsules (120 mg total) by mouth at bedtime.  3   albuterol  (VENTOLIN  HFA) 108 (90 Base) MCG/ACT inhaler Inhale 1-2 puffs into the lungs every 6 (six) hours as needed for shortness of breath or wheezing.     Ascorbic Acid (VITAMIN C-ROSE HIPS CR) 1500 MG TBCR Take 1,500 mg by mouth daily.     atorvastatin  (LIPITOR ) 80 MG tablet Take 1 tablet (80 mg total) by mouth daily with supper. 90 tablet 1   b complex vitamins capsule Take 1 capsule by mouth daily.     Baclofen  5 MG TABS Take 1 tablet (5 mg total) by mouth at bedtime. 90 tablet 0   busPIRone  (BUSPAR )  15 MG tablet Take 15 mg by mouth 3 (three) times daily.     Calcium  Citrate-Vitamin D  (CALCIUM  CITRATE + D PO) Take 1 tablet by mouth daily.     famotidine  (PEPCID ) 10 MG tablet Take 10 mg by mouth daily with breakfast.     fludrocortisone  (FLORINEF ) 0.1 MG tablet Take 0.2 mg by mouth daily.     fluticasone  (FLONASE ) 50 MCG/ACT nasal spray Place 1 spray into both nostrils daily as needed for allergies or rhinitis.     gabapentin  (NEURONTIN ) 300 MG capsule Take 3 capsules by mouth in the morning, 2 capsules in the afternoon and 2 capsules at night 630 capsule 0   loratadine (CLARITIN) 10 MG tablet Take by mouth.     Multiple Vitamin (MULTIVITAMIN WITH MINERALS) TABS tablet Take 1 tablet by mouth daily.     ondansetron  (ZOFRAN -ODT) 4 MG disintegrating tablet Take 1 tablet by mouth every 8 (eight) hours as needed for nausea/vomiting.     oxybutynin  (DITROPAN ) 5 MG tablet Take 1 tablet (5 mg total) by mouth 2 (two) times daily. 180 tablet 0   pantoprazole  (PROTONIX ) 40 MG tablet Take 1 tablet (40 mg total) by mouth daily. 30 tablet 5   topiramate (TOPAMAX) 50 MG tablet TAKE ONE TABLET BY MOUTH ONE TIME DAILY 90 tablet 0   No current facility-administered medications for this visit.    Allergies  Allergen Reactions   Zolpidem Swelling    Tongue swelling, thrush symptoms.    Diagnoses:  Major depressive disorder, recurrent episode, moderate (HCC)  Social anxiety disorder  Grief  Plan of Care: Pt is a 63 y/o married female seeking counseling for depression, anxiety and grief.  Pt reports hx of anxiety and depression for years w/ previous counseling and medication management.  Pt reports hasn't found good fit in counseling for years and seeking counseling frequency to be weekly to biweekly.  Pt reports struggling w/ worry re: husband's health, relationship w/ daughter.  Pt reports struggles w/ depressed mood and sleep disturbance fatigue.  Pt reports social anxiety and no connections made since  in [place].  Pt participated in tx plan development and will f/u w/ weekly to biweekly counseling.  Pt to f/u w/ PCP as scheduled for medication management.    Individualized Treatment Plan Strengths: seeking counseling.  Pt enjoys crocheting.   Supports: husband and daughter   Goal/Needs for Treatment:  In order of importance to patient 1) coping w/ depression and anxiety 2) work on grief related to losses.   3) --   Client Statement of Needs: "I need to work though stressors and losses- My daughter's stuff, my mom's stuff, my grief w/ my mom, Work on my stuff with him (husband) and losing him."   Treatment Level:outpatient counseling  Symptoms:depression, anxiety, worry, fatigue, grief, sleep disturbance  Client Treatment Preferences:Weekly to biweekly counseling.   Healthcare consumer's goal for treatment:  Counselor, Clydie Darter, [facility] will support the patient's ability to achieve the goals identified. Cognitive Behavioral Therapy, Assertive Communication/Conflict Resolution Training, Relaxation Training, ACT, Humanistic and other evidenced-based practices will be used to promote progress towards healthy functioning.   Healthcare consumer will: Actively participate in therapy, working towards healthy functioning.    *Justification for Continuation/Discontinuation of Goal: R=Revised, O=Ongoing, A=Achieved, D=Discontinued  Goal 1) Manage depression and anxiety, reframing related thoughts, self care and behavioral management AEB pt report of decreased symptoms and therapist observation.  Baseline date 03/16/24: Progress towards goal 0; How Often - Daily Target Date Goal Was reviewed Status Code Progress towards goal/Likert rating  03/16/25                Goal 2) Increase effective coping w/ losses and grief through effective expression of feelings AEB pt verbalizing in session and therapist observation.  Baseline date 03/16/24: Progress towards goal 0/; How Often - Daily Target Date Goal  Was reviewed Status Code Progress towards goal  03/16/25                 This plan has been reviewed and created by the following participants:  This plan will be reviewed at least every 12 months. Date Behavioral Health Clinician Date Guardian/Patient   03/16/24 [facility] [name] [facility] 03/16/24 Verbal Consent Provided and signature on file                    [place], [facility]

## 2024-03-16 NOTE — Progress Notes (Signed)
   Forde Radon, [facility]

## 2024-03-21 DIAGNOSIS — I959 Hypotension, unspecified: Secondary | ICD-10-CM | POA: Diagnosis not present

## 2024-03-22 ENCOUNTER — Ambulatory Visit (INDEPENDENT_AMBULATORY_CARE_PROVIDER_SITE_OTHER): Admitting: Psychology

## 2024-03-22 DIAGNOSIS — F331 Major depressive disorder, recurrent, moderate: Secondary | ICD-10-CM

## 2024-03-22 DIAGNOSIS — F4321 Adjustment disorder with depressed mood: Secondary | ICD-10-CM | POA: Diagnosis not present

## 2024-03-22 DIAGNOSIS — F4010 Social phobia, unspecified: Secondary | ICD-10-CM

## 2024-03-22 NOTE — Progress Notes (Signed)
 [facility] Counselor/Therapist Progress Note  Patient ID: Angela Mahoney, MRN: [id number],    Date: 03/22/2024  Time Spent: 1:30pm-2:18pm   Pt is seen for virtual video visit via caregility.  Pt joins from her home, reporting privacy, and counselor from her home office. Pt consents to virtual visit and is aware of limitations of such visits.    Treatment Type: Individual Therapy  Reported Symptoms: depressed mood  Mental Status Exam: Appearance:  Well Groomed     Behavior: Appropriate  Motor: Normal  Speech/Language:  Clear and Coherent  Affect: Appropriate  Mood: depressed  Thought process: normal  Thought content:   WNL  Sensory/Perceptual disturbances:   WNL  Orientation: oriented to person, place, time/date, and situation  Attention: Good  Concentration: Good  Memory: WNL  Fund of knowledge:  Good  Insight:   Good  Judgment:  Good  Impulse Control: Good   Risk Assessment: Danger to Self:  No Self-injurious Behavior: No Danger to Others: No Duty to Warn:no Physical Aggression / Violence:No  Access to Firearms a concern: No  Gang Involvement:No   Subjective: Counselor assessed pt current functioning per pt report.  Processed w/ pt moods and interactions over past week.  Explored w/ pt history of change w/ living, moves, health issues that impacted plans for her and husband.  Discussed ways of still engaging w/ positives.  Pt affect wnl.  Pt reports depressed moods.  Pt reports no major stressors past week, busy w/ appointments.  Pt discussed how plans changed for her and husband following dx and medical issues and how impacted wants to travel.  Pt reports w/ current barriers minimal w/ leaving house.  Pt discussed interactions w/ grandkids.  Pt reports grateful for son in law and how been benefit to them w/ limited monthly income.    Interventions: Cognitive Behavioral Therapy and supportive  Diagnosis:Major depressive disorder, recurrent episode,  moderate (HCC)  Social anxiety disorder  Grief  Plan: Pt to f/u in 1 week w/ counseling.  Pt to f/u as scheduled w/ PCP    ndividualized Treatment Plan Strengths: seeking counseling.  Pt enjoys crocheting.   Supports: husband and daughter   Goal/Needs for Treatment:  In order of importance to patient 1) coping w/ depression and anxiety 2) work on grief related to losses.   3) --   Client Statement of Needs: "I need to work though stressors and losses- My daughter's stuff, my mom's stuff, my grief w/ my mom, Work on my stuff with him (husband) and losing him."   Treatment Level:outpatient counseling  Symptoms:depression, anxiety, worry, fatigue, grief, sleep disturbance  Client Treatment Preferences:Weekly to biweekly counseling.   Healthcare consumer's goal for treatment:  Counselor, Angela Mahoney, Angela Mahoney will support the patient's ability to achieve the goals identified. Cognitive Behavioral Therapy, Assertive Communication/Conflict Resolution Training, Relaxation Training, ACT, Humanistic and other evidenced-based practices will be used to promote progress towards healthy functioning.   Healthcare consumer will: Actively participate in therapy, working towards healthy functioning.    *Justification for Continuation/Discontinuation of Goal: R=Revised, O=Ongoing, A=Achieved, D=Discontinued  Goal 1) Manage depression and anxiety, reframing related thoughts, self care and behavioral management AEB pt report of decreased symptoms and therapist observation.  Baseline date 03/16/24: Progress towards goal 0; How Often - Daily Target Date Goal Was reviewed Status Code Progress towards goal/Likert rating  03/16/25                Goal 2) Increase effective coping w/ losses and grief  through effective expression of feelings AEB pt verbalizing in session and therapist observation.  Baseline date 03/16/24: Progress towards goal 0/; How Often - Daily Target Date Goal Was reviewed Status Code  Progress towards goal  03/16/25                 This plan has been reviewed and created by the following participants:  This plan will be reviewed at least every 12 months. Date Behavioral Health Clinician Date Guardian/Patient   03/16/24 Angela Mahoney 03/16/24 Verbal Consent Provided and signature on file         Angela Mahoney, [facility]

## 2024-03-28 ENCOUNTER — Ambulatory Visit: Admitting: Psychiatry

## 2024-03-28 DIAGNOSIS — Z7689 Persons encountering health services in other specified circumstances: Secondary | ICD-10-CM | POA: Diagnosis not present

## 2024-03-29 DIAGNOSIS — H524 Presbyopia: Secondary | ICD-10-CM | POA: Diagnosis not present

## 2024-03-29 DIAGNOSIS — H52223 Regular astigmatism, bilateral: Secondary | ICD-10-CM | POA: Diagnosis not present

## 2024-03-30 ENCOUNTER — Ambulatory Visit: Admitting: Psychology

## 2024-03-30 DIAGNOSIS — F331 Major depressive disorder, recurrent, moderate: Secondary | ICD-10-CM

## 2024-03-30 DIAGNOSIS — F4321 Adjustment disorder with depressed mood: Secondary | ICD-10-CM

## 2024-03-30 DIAGNOSIS — F4010 Social phobia, unspecified: Secondary | ICD-10-CM
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
## 2024-04-03 ENCOUNTER — Telehealth: Payer: Self-pay

## 2024-04-03 NOTE — Telephone Encounter (Signed)
 Copied from CRM [id number]. Topic: Referral - Prior Authorization Question >> Apr 03, 2024  4:06 PM Clyde Darling P wrote: Reason for CRM: Pt stated she saw kevin haddix orthopedic trauma specialist([phone]) and was recommend that [phone]) give her an injection in her ankle however pt insurance is requesting the prior authorization be submitted by PCP as the provider at [facility] is out of network. Pt advise you can contact her for any questions or [phone]

## 2024-04-05 ENCOUNTER — Ambulatory Visit (INDEPENDENT_AMBULATORY_CARE_PROVIDER_SITE_OTHER): Admitting: Psychology

## 2024-04-05 DIAGNOSIS — F4321 Adjustment disorder with depressed mood: Secondary | ICD-10-CM

## 2024-04-05 DIAGNOSIS — F331 Major depressive disorder, recurrent, moderate: Secondary | ICD-10-CM

## 2024-04-05 DIAGNOSIS — F4010 Social phobia, unspecified: Secondary | ICD-10-CM | POA: Diagnosis not present

## 2024-04-05 NOTE — Progress Notes (Signed)
 [facility] Counselor/Therapist Progress Note  Patient ID: Angela Mahoney, MRN: [id number],    Date: 04/05/2024  Time Spent: 2:30pm-3:28pm   Pt is seen for a virtual video visit via caregility.  Pt joins from her home, reporting privacy, and counselor from her home office.  Pt consents of virtual visits and is aware of limitation so such visits.     Treatment Type: Individual Therapy  Reported Symptoms: Pt reports upset that didn't get more time w/ granddaughter. Pt reports positive interactions at party.  Mental Status Exam: Appearance:  Well Groomed     Behavior: Appropriate  Motor: Normal  Speech/Language:  Clear and Coherent  Affect: Appropriate  Mood: depressed  Thought process: normal  Thought content:   WNL  Sensory/Perceptual disturbances:   WNL  Orientation: oriented to person, place, time/date, and situation  Attention: Good  Concentration: Good  Memory: WNL  Fund of knowledge:  Good  Insight:   Good  Judgment:  Good  Impulse Control: Good   Risk Assessment: Danger to Self:  No Self-injurious Behavior: No Danger to Others: No Duty to Warn:no Physical Aggression / Violence:No  Access to Firearms a concern: No  Gang Involvement:No   Subjective: Counselor assessed pt current functioning per pt report.  Processed w/ pt positives and stressors.  Explored interactions w/ daughter and granddaughter at recent party.  Reflected positive involvement and ways she can focus on her role in.  Explored pt outings and ways to identifying next steps.  Pt affect wnl. Pt reported that her granddaughter's party was great.  Pt really enjoyed seeing her so happy.  Pt reports that her visiting granddaughter left before she could see her again.  Pt expressed disappointment.  Pt feels that her relationship has been influenced by stressors between her and daughter.  Pt is looking forward to daughter nail day.  Pt recognizes daughter busy and other things attending  to.  Pt expressed difficulty w/ social anxiety impacting getting gout more.  Pt receptive to baby steps to not avoid.     Interventions: Cognitive Behavioral Therapy and supportive  Diagnosis:Major depressive disorder, recurrent episode, moderate (HCC)  Social anxiety disorder  Grief  Plan: Pt to f/u in 1 week w/ counseling.  Pt to f/u as scheduled w/ PCP    ndividualized Treatment Plan Strengths: seeking counseling.  Pt enjoys crocheting.   Supports: husband and daughter   Goal/Needs for Treatment:  In order of importance to patient 1) coping w/ depression and anxiety 2) work on grief related to losses.   3) --   Client Statement of Needs: "I need to work though stressors and losses- My daughter's stuff, my mom's stuff, my grief w/ my mom, Work on my stuff with him (husband) and losing him."   Treatment Level:outpatient counseling  Symptoms:depression, anxiety, worry, fatigue, grief, sleep disturbance  Client Treatment Preferences:Weekly to biweekly counseling.   Healthcare consumer's goal for treatment:  Counselor, Clydie Darter, [facility] will support the patient's ability to achieve the goals identified. Cognitive Behavioral Therapy, Assertive Communication/Conflict Resolution Training, Relaxation Training, ACT, Humanistic and other evidenced-based practices will be used to promote progress towards healthy functioning.   Healthcare consumer will: Actively participate in therapy, working towards healthy functioning.    *Justification for Continuation/Discontinuation of Goal: R=Revised, O=Ongoing, A=Achieved, D=Discontinued  Goal 1) Manage depression and anxiety, reframing related thoughts, self care and behavioral management AEB pt report of decreased symptoms and therapist observation.  Baseline date 03/16/24: Progress towards goal 0;  How Often - Daily Target Date Goal Was reviewed Status Code Progress towards goal/Likert rating  03/16/25                Goal 2) Increase  effective coping w/ losses and grief through effective expression of feelings AEB pt verbalizing in session and therapist observation.  Baseline date 03/16/24: Progress towards goal 0/; How Often - Daily Target Date Goal Was reviewed Status Code Progress towards goal  03/16/25                 This plan has been reviewed and created by the following participants:  This plan will be reviewed at least every 12 months. Date Behavioral Health Clinician Date Guardian/Patient   03/16/24 [facility] [name] [facility] 03/16/24 Verbal Consent Provided and signature on file           [place], [facility]

## 2024-04-06 ENCOUNTER — Telehealth: Payer: Self-pay | Admitting: Family Medicine

## 2024-04-06 DIAGNOSIS — M19079 Primary osteoarthritis, unspecified ankle and foot: Secondary | ICD-10-CM

## 2024-04-06 NOTE — Telephone Encounter (Signed)
 Pt needs a PA for "fluoroscopically guided ankle injection " however I do not see any discussion on this and it was recommended by [facility] orthopedic trauma specialist? Please advise how to proceed

## 2024-04-06 NOTE — Telephone Encounter (Signed)
 LM for pt we are unable to handle this request as we have not ordered this or referred for this issue. She will need to reach out to the orthopedist to have this handled. Provided office number for any questions.

## 2024-04-06 NOTE — Telephone Encounter (Signed)
 Copied from CRM [id number]. Topic: Referral - Prior Authorization Question >> Apr 03, 2024  4:06 PM Clyde Darling P wrote: Reason for CRM: Pt stated she saw kevin haddix orthopedic trauma specialist([phone]) and was recommend that [phone]) give her an injection in her ankle however pt insurance is requesting the prior authorization be submitted by PCP as the provider at [facility] is out of network. Pt advise you can contact her for any questions or [phone] >> Apr 05, 2024  4:24 PM [place] J wrote: Patient is calling back to get an update on this matter. Original CRM was routed to incorrect office, I did let patient know that this is the cause of the delay of a response.  Patient did state that the injection is called " fluoroscopically guided ankle injection".

## 2024-04-07 DIAGNOSIS — I499 Cardiac arrhythmia, unspecified: Secondary | ICD-10-CM | POA: Diagnosis not present

## 2024-04-07 DIAGNOSIS — R296 Repeated falls: Secondary | ICD-10-CM | POA: Diagnosis not present

## 2024-04-07 DIAGNOSIS — I959 Hypotension, unspecified: Secondary | ICD-10-CM | POA: Diagnosis not present

## 2024-04-07 DIAGNOSIS — I2510 Atherosclerotic heart disease of native coronary artery without angina pectoris: Secondary | ICD-10-CM | POA: Diagnosis not present

## 2024-04-07 DIAGNOSIS — R079 Chest pain, unspecified: Secondary | ICD-10-CM | POA: Diagnosis not present

## 2024-04-07 DIAGNOSIS — R42 Dizziness and giddiness: Secondary | ICD-10-CM | POA: Diagnosis not present

## 2024-04-07 NOTE — Telephone Encounter (Signed)
 LM for pt to clarify if we need to put orthopedic referral in for [facility] or if she is still requesting referral for PA for injection there

## 2024-04-07 NOTE — Telephone Encounter (Signed)
 Copied from CRM [id number]. Topic: Referral - Prior Authorization Question >> Apr 07, 2024 10:47 AM Danae Duncans wrote: Patient called back 05/22 and advise the provider is out of network that sent her to [facility] so her insurance advise her to have PCP send the referral to [facility]

## 2024-04-08 ENCOUNTER — Telehealth: Payer: Self-pay | Admitting: Family Medicine

## 2024-04-11 DIAGNOSIS — Z6841 Body Mass Index (BMI) 40.0 and over, adult: Secondary | ICD-10-CM | POA: Diagnosis not present

## 2024-04-11 DIAGNOSIS — E66813 Obesity, class 3: Secondary | ICD-10-CM | POA: Diagnosis not present

## 2024-04-12 ENCOUNTER — Other Ambulatory Visit: Payer: Self-pay | Admitting: Family Medicine

## 2024-04-12 ENCOUNTER — Telehealth: Payer: Self-pay | Admitting: Family Medicine

## 2024-04-12 NOTE — Telephone Encounter (Signed)
 Copied from CRM [id number]. Topic: General - Other >> Apr 12, 2024 11:21 AM Angela Mahoney wrote: Reason for CRM: Patient returning call to clarify she needs a referral to be sent to [facility] to see Donella Fulling, DO. Patient stated she has an appointment on 04/24/24 and would like referral sent before then.  Address: [address], [place], [place] [address] Phone: [phone] Fax: [phone]

## 2024-04-12 NOTE — Telephone Encounter (Signed)
 Referral placed.

## 2024-04-12 NOTE — Telephone Encounter (Signed)
 Please see other encounter.

## 2024-04-12 NOTE — Telephone Encounter (Signed)
 LM for pt to clarify what her question is so we can see if PCP can address.    Typically we are unable to advise on other labs placed.

## 2024-04-12 NOTE — Addendum Note (Signed)
 Addended by: Duston Smolenski E on: 04/12/2024 01:30 PM   Modules accepted: Orders

## 2024-04-12 NOTE — Telephone Encounter (Signed)
 Copied from CRM [id number]. Topic: Clinical - Lab/Test Results >> Apr 12, 2024 11:25 AM Magdalene School wrote: Reason for CRM: Patient would like call back to discuss abnormal lab results with PCP from tests that were done at endocrinologist office on 04/07/24.

## 2024-04-12 NOTE — Telephone Encounter (Signed)
 Patient returning call to clarify she needs a referral to be sent to [facility] to see Donella Fulling, DO. Patient stated she has an appointment on 04/24/24 and would like referral sent before then.   Address: [address], [place], [place] [address] Phone: [phone] Fax: [phone]

## 2024-04-13 ENCOUNTER — Ambulatory Visit: Admitting: Psychology

## 2024-04-13 DIAGNOSIS — F331 Major depressive disorder, recurrent, moderate: Secondary | ICD-10-CM

## 2024-04-13 DIAGNOSIS — F4321 Adjustment disorder with depressed mood: Secondary | ICD-10-CM | POA: Diagnosis not present

## 2024-04-13 DIAGNOSIS — F4010 Social phobia, unspecified: Secondary | ICD-10-CM | POA: Diagnosis not present

## 2024-04-13 NOTE — Telephone Encounter (Signed)
 Copied from CRM [id number]. Topic: Clinical - Lab/Test Results >> Apr 12, 2024  2:40 PM Jim Motts C wrote: Reason for CRM: Patient is returning call to speak with Alisa App. I did ask her if she had any specific questions for me to include in the note but she stated that she just wanted to go over the results that appeared in her MyChart with Alisa App.

## 2024-04-13 NOTE — Progress Notes (Signed)
 [facility] Counselor/Therapist Progress Note  Patient ID: Angela Mahoney, MRN: [id number],    Date: 04/13/2024  Time Spent: 8:02am-8:55am   Pt is seen for an in person visit.    Treatment Type: Individual Therapy  Reported Symptoms: pt reports tired/fatigue and recent stress/worry/frustration w/ medical concerns and referral process  Mental Status Exam: Appearance:  Well Groomed     Behavior: Appropriate  Motor: Normal  Speech/Language:  Clear and Coherent  Affect: Appropriate  Mood: depressed  Thought process: normal  Thought content:   WNL  Sensory/Perceptual disturbances:   WNL  Orientation: oriented to person, place, time/date, and situation  Attention: Good  Concentration: Good  Memory: WNL  Fund of knowledge:  Good  Insight:   Good  Judgment:  Good  Impulse Control: Good   Risk Assessment: Danger to Self:  No Self-injurious Behavior: No Danger to Others: No Duty to Warn:no Physical Aggression / Violence:No  Access to Firearms a concern: No  Gang Involvement:No   Subjective: Counselor assessed pt current functioning per pt report.  Processed w/ pt stressors w/ recent medical concerns and referral process.  Validated and normalized frustrations and assisted in reframing focus on her self care.  Discussed steps taking to create new routines.  Explored self care options for things she may enjoy.   Pt affect wnl. Pt reports some fatigue.  Pt reports feeling frustration and discouraged w/ referral process and insurance/doctor offices.  Pt reports she has been recommended for further testing w/ her heart and potential progressive blockage and referred for orthopedist but found out on referring provider no longer in network.  Pt discussed new routines working towards w/ taking blood pressure daily and working out 20 minutes several times a week.  Pt plan start date next week.  Pt considered some self care for thins she enjoys.    Interventions:  Cognitive Behavioral Therapy and supportive  Diagnosis:Major depressive disorder, recurrent episode, moderate (HCC)  Social anxiety disorder  Grief  Plan: Pt to f/u in 1 week w/ counseling.  Pt to f/u as scheduled w/ PCP    ndividualized Treatment Plan Strengths: seeking counseling.  Pt enjoys crocheting.   Supports: husband and daughter   Goal/Needs for Treatment:  In order of importance to patient 1) coping w/ depression and anxiety 2) work on grief related to losses.   3) --   Client Statement of Needs: "I need to work though stressors and losses- My daughter's stuff, my mom's stuff, my grief w/ my mom, Work on my stuff with him (husband) and losing him."   Treatment Level:outpatient counseling  Symptoms:depression, anxiety, worry, fatigue, grief, sleep disturbance  Client Treatment Preferences:Weekly to biweekly counseling.   Healthcare consumer's goal for treatment:  Counselor, Clydie Darter, [facility] will support the patient's ability to achieve the goals identified. Cognitive Behavioral Therapy, Assertive Communication/Conflict Resolution Training, Relaxation Training, ACT, Humanistic and other evidenced-based practices will be used to promote progress towards healthy functioning.   Healthcare consumer will: Actively participate in therapy, working towards healthy functioning.    *Justification for Continuation/Discontinuation of Goal: R=Revised, O=Ongoing, A=Achieved, D=Discontinued  Goal 1) Manage depression and anxiety, reframing related thoughts, self care and behavioral management AEB pt report of decreased symptoms and therapist observation.  Baseline date 03/16/24: Progress towards goal 0; How Often - Daily Target Date Goal Was reviewed Status Code Progress towards goal/Likert rating  03/16/25                Goal  2) Increase effective coping w/ losses and grief through effective expression of feelings AEB pt verbalizing in session and therapist observation.  Baseline  date 03/16/24: Progress towards goal 0/; How Often - Daily Target Date Goal Was reviewed Status Code Progress towards goal  03/16/25                 This plan has been reviewed and created by the following participants:  This plan will be reviewed at least every 12 months. Date Behavioral Health Clinician Date Guardian/Patient   03/16/24 [facility] [name] [facility] 03/16/24 Verbal Consent Provided and signature on file                [place], [facility]

## 2024-04-13 NOTE — Telephone Encounter (Signed)
 Called pt and discussed she needs to discuss results w the ordering provider as they ordered the labs. Pt states she will but all they say is that she needs to discuss w her PCP bc she orders all her medications. Pt was very addiment on having PCP go over results so I scheduled her an appt. Pt wanted to wait until PCP got back from vacation bc she wants to only see her for this so I booked next available.

## 2024-04-20 ENCOUNTER — Ambulatory Visit: Admitting: Psychology

## 2024-04-20 DIAGNOSIS — F4321 Adjustment disorder with depressed mood: Secondary | ICD-10-CM | POA: Diagnosis not present

## 2024-04-20 DIAGNOSIS — F331 Major depressive disorder, recurrent, moderate: Secondary | ICD-10-CM

## 2024-04-20 DIAGNOSIS — F4010 Social phobia, unspecified: Secondary | ICD-10-CM

## 2024-04-20 NOTE — Progress Notes (Signed)
 [facility] Counselor/Therapist Progress Note  Patient ID: Angela Mahoney, MRN: [id number],    Date: 04/20/2024  Time Spent: 9:06am-9:54am   Pt is seen for a virtual video visit via caregility.    Pt joins from her home, reporting privacy, and counselor from her office.  Pt consents of virtual visits and is aware of limitation so such visits.  Treatment Type: Individual Therapy  Reported Symptoms: pt reports feeling left out and down  Mental Status Exam: Appearance:  Well Groomed     Behavior: Appropriate  Motor: Normal  Speech/Language:  Clear and Coherent  Affect: Appropriate  Mood: depressed  Thought process: normal  Thought content:   WNL  Sensory/Perceptual disturbances:   WNL  Orientation: oriented to person, place, time/date, and situation  Attention: Good  Concentration: Good  Memory: WNL  Fund of knowledge:  Good  Insight:   Good  Judgment:  Good  Impulse Control: Good   Risk Assessment: Danger to Self:  No Self-injurious Behavior: No Danger to Others: No Duty to Warn:no Physical Aggression / Violence:No  Access to Firearms a concern: No  Gang Involvement:No   Subjective: Counselor assessed pt current functioning per pt report.  Processed w/ pt emotions and interactions w/ daughter.  Discussed need for increasing social circle to build social connectedness.  Assisted w/ identifying options to explored in community interests and faith community she desires.  Discussed next steps.  Pt affect wnl. Pt reports feeling down.  Pt reports feeling left out and wants to spend more time w/ daughter.  Pt feels she has invited her to things and aware that needs to look to other's to meet needs for connectedness.  Pt discussed interests in crafts and maybe joining a community class.  Pt also discussed want for bible study and faith community.  Pt agrees to f/u w/ looking into these communities and recognizes would be good to get her out of house too.     Interventions: Cognitive Behavioral Therapy, Solution-Oriented/Positive Psychology, and supportive  Diagnosis:Major depressive disorder, recurrent episode, moderate (HCC)  Social anxiety disorder  Grief  Plan: Pt to f/u in 1 week w/ counseling.  Pt to f/u as scheduled w/ PCP    ndividualized Treatment Plan Strengths: seeking counseling.  Pt enjoys crocheting.   Supports: husband and daughter   Goal/Needs for Treatment:  In order of importance to patient 1) coping w/ depression and anxiety 2) work on grief related to losses.   3) --   Client Statement of Needs: "I need to work though stressors and losses- My daughter's stuff, my mom's stuff, my grief w/ my mom, Work on my stuff with him (husband) and losing him."   Treatment Level:outpatient counseling  Symptoms:depression, anxiety, worry, fatigue, grief, sleep disturbance  Client Treatment Preferences:Weekly to biweekly counseling.   Healthcare consumer's goal for treatment:  Counselor, Clydie Darter, [facility] will support the patient's ability to achieve the goals identified. Cognitive Behavioral Therapy, Assertive Communication/Conflict Resolution Training, Relaxation Training, ACT, Humanistic and other evidenced-based practices will be used to promote progress towards healthy functioning.   Healthcare consumer will: Actively participate in therapy, working towards healthy functioning.    *Justification for Continuation/Discontinuation of Goal: R=Revised, O=Ongoing, A=Achieved, D=Discontinued  Goal 1) Manage depression and anxiety, reframing related thoughts, self care and behavioral management AEB pt report of decreased symptoms and therapist observation.  Baseline date 03/16/24: Progress towards goal 0; How Often - Daily Target Date Goal Was reviewed Status Code Progress towards goal/Likert  rating  03/16/25                Goal 2) Increase effective coping w/ losses and grief through effective expression of feelings AEB pt  verbalizing in session and therapist observation.  Baseline date 03/16/24: Progress towards goal 0/; How Often - Daily Target Date Goal Was reviewed Status Code Progress towards goal  03/16/25                 This plan has been reviewed and created by the following participants:  This plan will be reviewed at least every 12 months. Date Behavioral Health Clinician Date Guardian/Patient   03/16/24 [facility] [name] [facility] 03/16/24 Verbal Consent Provided and signature on file                Clydie Darter [facility]               [place], LCMHC

## 2024-04-24 ENCOUNTER — Ambulatory Visit: Payer: Self-pay

## 2024-04-24 ENCOUNTER — Other Ambulatory Visit: Payer: Self-pay | Admitting: Internal Medicine

## 2024-04-24 ENCOUNTER — Encounter: Payer: Self-pay | Admitting: Family Medicine

## 2024-04-24 DIAGNOSIS — N3001 Acute cystitis with hematuria: Secondary | ICD-10-CM | POA: Insufficient documentation

## 2024-04-24 MED ORDER — NITROFURANTOIN MONOHYD MACRO 100 MG PO CAPS: 100.0000 mg | ORAL_CAPSULE | Freq: Two times a day (BID) | ORAL | 0 refills | Status: AC

## 2024-04-24 NOTE — Telephone Encounter (Signed)
 FYI Only or Action Required?: Action required by provider  Patient was last seen in primary care on 02/28/2024 by Alyson Back L, NP-C. Called Nurse Triage reporting Urinary Tract Infection. Symptoms began 4 days ago. Interventions attempted: OTC medications: AZO. Symptoms are: unchanged.  Triage Disposition: See Physician Within 24 Hours  Patient/caregiver understands and will follow disposition?: No, wishes to speak with PCP  1. SYMPTOM: "What's the main symptom you're concerned about?" (e.g., frequency, incontinence)     Burning with urination 2. ONSET: "When did the  burning  start?"     4 days 3. PAIN: "Is there any pain?" If Yes, ask: "How bad is it?" (Scale: 1-10; mild, moderate, severe)     Severe in beginning, drank more water and that has helped, also using OTC meds-AZO, still moderate 4. CAUSE: "What do you think is causing the symptoms?"     UTI, had diarrhea last week  5. OTHER SYMPTOMS: "Do you have any other symptoms?" (e.g., blood in urine, fever, flank pain, pain with urination)     denies Reason for Disposition  All other patients with painful urination  (Exception: [1] EITHER frequency or urgency AND [2] has on-call doctor.)  Answer Assessment - Initial Assessment Questions 1. SYMPTOM: "What's the main symptom you're concerned about?" (e.g., frequency, incontinence)     Burning with urination 2. ONSET: "When did the  burning  start?"     4 days 3. PAIN: "Is there any pain?" If Yes, ask: "How bad is it?" (Scale: 1-10; mild, moderate, severe)     Severe in beginning, drank more water and that has helped, also using OTC meds-AZO, still moderate 4. CAUSE: "What do you think is causing the symptoms?"     UTI, had diarrhea last week  5. OTHER SYMPTOMS: "Do you have any other symptoms?" (e.g., blood in urine, fever, flank pain, pain with urination)     denies  Protocols used: Urinary Symptoms-A-AH, Urination Pain - Female-A-AH Pt was advised that an appt is recommended  d/t evaluation and UA. Pt states that she cannot come in because she is getting over her diarrhea. Pt refused appt. Pt would like rx sent in and for a call back.

## 2024-04-24 NOTE — Telephone Encounter (Signed)
  First attempt; no answer; left vm.      Copied From CRM [id number]. Reason for Triage: Patient calling for Rx for UTI. Diarrhea all week, got dehydrated and now has a UTI.

## 2024-04-25 DIAGNOSIS — E639 Nutritional deficiency, unspecified: Secondary | ICD-10-CM | POA: Diagnosis not present

## 2024-04-27 ENCOUNTER — Ambulatory Visit: Admitting: Psychology

## 2024-04-27 DIAGNOSIS — F4321 Adjustment disorder with depressed mood: Secondary | ICD-10-CM | POA: Diagnosis not present

## 2024-04-27 DIAGNOSIS — F331 Major depressive disorder, recurrent, moderate: Secondary | ICD-10-CM | POA: Diagnosis not present

## 2024-04-27 DIAGNOSIS — F4010 Social phobia, unspecified: Secondary | ICD-10-CM
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
## 2024-05-03 ENCOUNTER — Ambulatory Visit (INDEPENDENT_AMBULATORY_CARE_PROVIDER_SITE_OTHER): Admitting: Psychology

## 2024-05-03 DIAGNOSIS — F4010 Social phobia, unspecified: Secondary | ICD-10-CM | POA: Diagnosis not present

## 2024-05-03 DIAGNOSIS — F4381 Prolonged grief disorder: Secondary | ICD-10-CM | POA: Diagnosis not present

## 2024-05-03 DIAGNOSIS — F331 Major depressive disorder, recurrent, moderate: Secondary | ICD-10-CM

## 2024-05-03 DIAGNOSIS — F4321 Adjustment disorder with depressed mood: Secondary | ICD-10-CM

## 2024-05-03 NOTE — Progress Notes (Signed)
 [facility] Counselor/Therapist Progress Note  Patient ID: Angela Mahoney, MRN: [id number],    Date: 05/03/2024  Time Spent: 1:32pm-2:22pm   Pt is seen for a virtual video visit via caregility.    Pt joins from her home, reporting privacy, and counselor from her home office.  Pt consents of virtual visits and is aware of limitation so such visits.  Treatment Type: Individual Therapy  Reported Symptoms: pt reports some overwhelmed and some disappointment recent  Mental Status Exam: Appearance:  Well Groomed     Behavior: Appropriate  Motor: Normal  Speech/Language:  Clear and Coherent  Affect: Appropriate  Mood: anxious and depressed  Thought process: normal  Thought content:   WNL  Sensory/Perceptual disturbances:   WNL  Orientation: oriented to person, place, time/date, and situation  Attention: Good  Concentration: Good  Memory: WNL  Fund of knowledge:  Good  Insight:   Good  Judgment:  Good  Impulse Control: Good   Risk Assessment: Danger to Self:  No Self-injurious Behavior: No Danger to Others: No Duty to Warn:no Physical Aggression / Violence:No  Access to Firearms a concern: No  Gang Involvement:No   Subjective: Counselor assessed pt current functioning per pt report.  Processed w/ pt recent emotions and contributing factors.  Explored overwhelmed by various appointments and procedures for self.  Discussed planning for scheduling to not be too much in one week/day.  Discussed disappointment w/ grandchildren lack of reaching out and positives of not passive aggressive response and other conflict resolution skills.  Pt affect congruent w/ report of feeling overwhelmed.  Pt reports she feels that she has so much going on w/ various specialist appointments for her health and procedures and her husband's.  Pt  recognized that scheduling for spacing out when can would be beneficial and pt did decide to reschedule our next appointment for 2 weeks  instead.  Pt expressed hurt and disappointment as 2 grandchildren didn't contact husband on father's day as had always done in past.  Pt discussed how she originally wanted to send a nasty message about and recognized this wouldn't help.  Pt considered other ways of communicating.   Interventions: Cognitive Behavioral Therapy, Solution-Oriented/Positive Psychology, and supportive  Diagnosis:Major depressive disorder, recurrent episode, moderate (HCC)  Social anxiety disorder  Grief  Plan: Pt to f/u in 2 weeks w/ counseling.  Pt to f/u as scheduled w/ PCP and specialist as scheduled.     ndividualized Treatment Plan Strengths: seeking counseling.  Pt enjoys crocheting.   Supports: husband and daughter   Goal/Needs for Treatment:  In order of importance to patient 1) coping w/ depression and anxiety 2) work on grief related to losses.   3) --   Client Statement of Needs: I need to work though stressors and losses- My daughter's stuff, my mom's stuff, my grief w/ my mom, Work on my stuff with him (husband) and losing him.   Treatment Level:outpatient counseling  Symptoms:depression, anxiety, worry, fatigue, grief, sleep disturbance  Client Treatment Preferences:Weekly to biweekly counseling.   Healthcare consumer's goal for treatment:  Counselor, Clydie Darter, [facility] will support the patient's ability to achieve the goals identified. Cognitive Behavioral Therapy, Assertive Communication/Conflict Resolution Training, Relaxation Training, ACT, Humanistic and other evidenced-based practices will be used to promote progress towards healthy functioning.   Healthcare consumer will: Actively participate in therapy, working towards healthy functioning.    *Justification for Continuation/Discontinuation of Goal: R=Revised, O=Ongoing, A=Achieved, D=Discontinued  Goal 1) Manage depression and anxiety,  reframing related thoughts, self care and behavioral management AEB pt report of decreased  symptoms and therapist observation.  Baseline date 03/16/24: Progress towards goal 0; How Often - Daily Target Date Goal Was reviewed Status Code Progress towards goal/Likert rating  03/16/25                Goal 2) Increase effective coping w/ losses and grief through effective expression of feelings AEB pt verbalizing in session and therapist observation.  Baseline date 03/16/24: Progress towards goal 0/; How Often - Daily Target Date Goal Was reviewed Status Code Progress towards goal  03/16/25                 This plan has been reviewed and created by the following participants:  This plan will be reviewed at least every 12 months. Date Behavioral Health Clinician Date Guardian/Patient   03/16/24 [facility] [name] [facility] 03/16/24 Verbal Consent Provided and signature on file              [place], [facility]

## 2024-05-09 DIAGNOSIS — R7303 Prediabetes: Secondary | ICD-10-CM | POA: Diagnosis not present

## 2024-05-09 DIAGNOSIS — R2681 Unsteadiness on feet: Secondary | ICD-10-CM | POA: Diagnosis not present

## 2024-05-09 DIAGNOSIS — E66813 Obesity, class 3: Secondary | ICD-10-CM | POA: Diagnosis not present

## 2024-05-09 DIAGNOSIS — Z6841 Body Mass Index (BMI) 40.0 and over, adult: Secondary | ICD-10-CM | POA: Diagnosis not present

## 2024-05-09 DIAGNOSIS — R32 Unspecified urinary incontinence: Secondary | ICD-10-CM | POA: Diagnosis not present

## 2024-05-09 DIAGNOSIS — Z008 Encounter for other general examination: Secondary | ICD-10-CM | POA: Diagnosis not present

## 2024-05-09 DIAGNOSIS — F17211 Nicotine dependence, cigarettes, in remission: Secondary | ICD-10-CM | POA: Diagnosis not present

## 2024-05-10 ENCOUNTER — Telehealth (INDEPENDENT_AMBULATORY_CARE_PROVIDER_SITE_OTHER): Admitting: Family Medicine

## 2024-05-10 ENCOUNTER — Encounter: Payer: Self-pay | Admitting: Family Medicine

## 2024-05-10 ENCOUNTER — Ambulatory Visit: Admitting: Family Medicine

## 2024-05-10 DIAGNOSIS — R197 Diarrhea, unspecified: Secondary | ICD-10-CM

## 2024-05-10 DIAGNOSIS — R350 Frequency of micturition: Secondary | ICD-10-CM | POA: Diagnosis not present

## 2024-05-10 NOTE — Progress Notes (Signed)
 MyChart Video Visit    Virtual Visit via Video Note    Patient location: Home. Patient and provider in visit Provider location: Office 2 patient identifiers used  I discussed the limitations of evaluation and management by telemedicine and the availability of in person appointments. The patient expressed understanding and agreed to proceed.  Visit Date: 05/10/2024  Today's healthcare provider: [name], NP-C     Subjective:    Patient ID: Angela Mahoney, female    DOB: 01/14/1961, 63 y.o.   MRN: [id number]  Chief Complaint  Patient presents with   Results    Did not discuss w endo her labs, wants to talk about the 2 high results.    HPI   Other providers: Cardiologist- Dr. Waymond [facility]- Dr. [place] Velton Keen pulmonology in the past.  OB/GYN- [facility] (mammogram and DEXA) Dr. Latisha  Endocrinologist prescribes her Florinef  for hypotension.   C/o diarrhea for the past 2 1/2-3 weeks.  Is taking Imodium which is helping but still having 2-3 loose stools per day. No blood. She was incontinent of feces in the beginning but not now.  Mild generalized stomach aches. No N/V.   She would like to have her urine checked as well. Recent UTI.  Still having urinary frequency.  She saw urology. She had to cancel her follow up.   States her cardiologist wants to do an angiogram in July.   No pain.   Past Medical History:  Diagnosis Date   Anemia    Anxiety    Arthritis    Back pain    Bilateral swelling of feet    Chest pain    Chronic fatigue syndrome    COPD (chronic obstructive pulmonary disease) (HCC) 2017   Coronary artery disease    Ozell Kubas, PA   Depression    Dyspnea    with exertion   Falls    Fibromyalgia    GERD (gastroesophageal reflux disease)    Headache    High cholesterol    History of appendectomy 10/26/2022   History of fainting spells of unknown cause    ? 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   Prediabetes 10/06/2023   Rheumatoid arthritis (HCC)    Seasonal allergies    Sleep apnea    does not use cpap   SOB (shortness of breath)    Vitamin D  deficiency     Past Surgical History:  Procedure Laterality Date   BUNIONECTOMY Right 2002   CARDIAC CATHETERIZATION  01/23/2016   In [place]   COLONOSCOPY     x 2 at ages 57yr and 24yr - [place]   EXTERNAL FIXATION REMOVAL Right 08/06/2021   Procedure: REMOVAL EXTERNAL FIXATION LEG;  Surgeon: Kendal Franky SQUIBB, MD;  Location: [facility] OR;  Service: Orthopedics;  Laterality: Right;  zimmer biomet ex-fix removal   LAPAROSCOPIC GASTRIC SLEEVE RESECTION  2017   [place]   MULTIPLE TOOTH EXTRACTIONS     has upper and lower dentures   ORIF ANKLE FRACTURE Right 08/06/2021   Procedure: OPEN REDUCTION INTERNAL FIXATION (ORIF) ANKLE FRACTURE;  Surgeon: Kendal Franky SQUIBB, MD;  Location: [facility] OR;  Service: Orthopedics;  Laterality: Right;  smith and nephew   SYNDESMOSIS REPAIR Right 12/15/2021   Procedure: SYNDESMOSIS  REPAIR;  Surgeon: Kendal Franky SQUIBB, MD;  Location: [facility] OR;  Service: Orthopedics;  Laterality: Right;    Family History  Problem Relation Age of Onset   Cancer Mother    Heart disease Mother    Hyperlipidemia Mother    Alcoholism Mother    Alcoholism Father    Heart disease Brother     Social History   Socioeconomic History   Marital status: Married    Spouse name: Christopher   Number of children: 1   Years of education: Not on file   Highest education level: GED or equivalent  Occupational History   Occupation: Disabled  Tobacco Use   Smoking status: Former    Current packs/day: 0.00    Average packs/day: 0.5 packs/day for 45.0 years (22.5 ttl pk-yrs)    Types: Cigarettes    Start date: 30    Quit date: 2014    Years since  quitting: 11.4   Smokeless tobacco: Never  Vaping Use   Vaping status: Never Used  Substance and Sexual Activity   Alcohol  use: Never   Drug use: Never   Sexual activity: Not Currently  Other Topics Concern   Not on file  Social History Narrative   Lives with her husband. Has a dog   Social Drivers of Corporate investment banker Strain: Medium Risk (05/05/2024)   Overall Financial Resource Strain (CARDIA)    Difficulty of Paying Living Expenses: Somewhat hard  Food Insecurity: Food Insecurity Present (05/05/2024)   Hunger Vital Sign    Worried About Running Out of Food in the Last Year: Often true    Ran Out of Food in the Last Year: Often true  Transportation Needs: No Transportation Needs (05/05/2024)   PRAPARE - Administrator, Civil Service (Medical): No    Lack of Transportation (Non-Medical): No  Physical Activity: Inactive (05/05/2024)   Exercise Vital Sign    Days of Exercise per Week: 0 days    Minutes of Exercise per Session: Not on file  Stress: Stress Concern Present (05/05/2024)   [employer] of Occupational Health - Occupational Stress Questionnaire    Feeling of Stress: To some extent  Social Connections: Moderately Integrated (05/05/2024)   Social Connection and Isolation Panel    Frequency of Communication with Friends and Family: More than three times a week    Frequency of Social Gatherings with Friends and Family: Once a week    Attends Religious Services: 1 to 4 times per year    Active Member of [employer] or Organizations: No    Attends Engineer, structural: Not on file    Marital Status: Married  Catering manager Violence: Not At Risk (01/05/2024)   Received from [facility]   HITS    Over the last 12 months how often did your partner physically hurt you?: Never    Over the last 12 months how often did your partner insult you or talk down to you?: Never    Over the last 12 months how often did your partner threaten you with  physical harm?: Never    Over the last 12 months how often did your partner scream or curse at you?: Never    Outpatient Medications Prior to Visit  Medication Sig Dispense Refill   albuterol  (VENTOLIN  HFA) 108 (90 Base) MCG/ACT inhaler Inhale 1-2 puffs into the lungs every 6 (six) hours as needed for shortness of breath or wheezing.     Ascorbic Acid (VITAMIN C-ROSE HIPS  CR) 1500 MG TBCR Take 1,500 mg by mouth daily.     atorvastatin  (LIPITOR ) 80 MG tablet Take 1 tablet (80 mg total) by mouth daily with supper. 90 tablet 1   b complex vitamins capsule Take 1 capsule by mouth daily.     Baclofen  5 MG TABS Take 1 tablet (5 mg total) by mouth at bedtime. 90 tablet 0   buPROPion  (WELLBUTRIN  XL) 150 MG 24 hr tablet Take 1 tablet (150 mg total) by mouth every morning. 30 tablet 2   busPIRone  (BUSPAR ) 15 MG tablet TAKE ONE TABLET BY MOUTH THREE TIMES DAILY 270 tablet 0   Calcium  Citrate-Vitamin D  (CALCIUM  CITRATE + D PO) Take 1 tablet by mouth daily.     DULoxetine  (CYMBALTA ) 60 MG capsule Take 2 capsules (120 mg total) by mouth at bedtime.  3   famotidine  (PEPCID ) 10 MG tablet Take 10 mg by mouth daily with breakfast.     fludrocortisone  (FLORINEF ) 0.1 MG tablet Take 0.2 mg by mouth daily.     fluticasone  (FLONASE ) 50 MCG/ACT nasal spray Place 1 spray into both nostrils daily as needed for allergies or rhinitis.     gabapentin  (NEURONTIN ) 300 MG capsule Take 3 capsules by mouth in the morning, 2 capsules in the afternoon and 2 capsules at night 630 capsule 0   loratadine (CLARITIN) 10 MG tablet Take by mouth.     Multiple Vitamin (MULTIVITAMIN WITH MINERALS) TABS tablet Take 1 tablet by mouth daily.     ondansetron  (ZOFRAN -ODT) 4 MG disintegrating tablet Take 1 tablet by mouth every 8 (eight) hours as needed for nausea/vomiting.     oxybutynin  (DITROPAN ) 5 MG tablet Take 1 tablet (5 mg total) by mouth 2 (two) times daily. 180 tablet 0   pantoprazole  (PROTONIX ) 40 MG tablet Take 1 tablet (40 mg  total) by mouth daily. 30 tablet 5   topiramate (TOPAMAX) 50 MG tablet TAKE ONE TABLET BY MOUTH ONE TIME DAILY 90 tablet 0   No facility-administered medications prior to visit.    Allergies  Allergen Reactions   Zolpidem Swelling    Tongue swelling, thrush symptoms.    Review of Systems  Constitutional:  Positive for malaise/fatigue. Negative for chills and fever.  Respiratory:  Negative for shortness of breath.   Cardiovascular:  Negative for chest pain, palpitations and leg swelling.  Gastrointestinal:  Positive for abdominal pain and diarrhea. Negative for constipation, nausea and vomiting.  Genitourinary:  Positive for frequency. Negative for dysuria and urgency.  Neurological:  Negative for dizziness and focal weakness.       Objective:    Physical Exam  There were no vitals taken for this visit. Wt Readings from Last 3 Encounters:  02/28/24 249 lb (112.9 kg)  01/12/24 248 lb (112.5 kg)  12/09/23 248 lb (112.5 kg)   Alert and oriented and in no acute distress.  Respirations unlabored.  Moving all extremities.  Normal speech and mood.    Assessment & Plan:   Problem List Items Addressed This Visit   None Visit Diagnoses       Diarrhea, unspecified type    -  Primary     Urinary frequency          Reports having diarrhea with generalized abdominal pain and urinary frequency for the past 2-1/2 to 3 weeks.  Taking Imodium with some improvement.  No longer having incontinence of feces.  At one point she felt dehydrated but since has been increasing fluid intake. She is under  the care of urology and takes oxybutynin .  Due for follow-up  Advised that she should come in for a visit.  Recommend labs, UA and stool studies.  States she has to take her husband to appointments tomorrow and Friday and the soonest appointment she could make would be on Monday.  Advised that if she worsens over the weekend that she will need to go to urgent care or the emergency  department.  I am having Camreigh A. Stough Deb maintain her albuterol , ondansetron , Calcium  Citrate-Vitamin D  (CALCIUM  CITRATE + D PO), multivitamin with minerals, fluticasone , fludrocortisone , famotidine , VITAMIN C-ROSE HIPS CR, DULoxetine , loratadine, buPROPion , topiramate, pantoprazole , oxybutynin , b complex vitamins, gabapentin , atorvastatin , busPIRone , and Baclofen .  No orders of the defined types were placed in this encounter.   I discussed the assessment and treatment plan with the patient. The patient was provided an opportunity to ask questions and all were answered. The patient agreed with the plan and demonstrated an understanding of the instructions.   The patient was advised to call back or seek an in-person evaluation if the symptoms worsen or if the condition fails to improve as anticipated.    [name], NP-C [facility] [place] [phone] (phone) [phone] (fax)  [facility]

## 2024-05-11 ENCOUNTER — Ambulatory Visit: Admitting: Psychology

## 2024-05-15 ENCOUNTER — Ambulatory Visit (INDEPENDENT_AMBULATORY_CARE_PROVIDER_SITE_OTHER): Admitting: Family Medicine

## 2024-05-15 ENCOUNTER — Encounter: Payer: Self-pay | Admitting: Family Medicine

## 2024-05-15 VITALS — BP 124/69 | HR 96 | Temp 98.5°F | Resp 16 | Ht 64.0 in | Wt 247.0 lb

## 2024-05-15 DIAGNOSIS — R195 Other fecal abnormalities: Secondary | ICD-10-CM | POA: Diagnosis not present

## 2024-05-15 DIAGNOSIS — Z87440 Personal history of urinary (tract) infections: Secondary | ICD-10-CM | POA: Diagnosis not present

## 2024-05-15 NOTE — Progress Notes (Signed)
 Assessment & Plan:  1. Loose stools (Primary) Education provided on diarrhea. - GI Profile, Stool, PCR; Future  2. History of UTI Discussed it is not recommended practice to repeat urinalysis with resolution of symptoms, but she would like to repeat anyway. - Urinalysis; Future   Follow up plan: Return if symptoms worsen or fail to improve.  Niki Rung, MSN, APRN, FNP-C  Subjective:  HPI: Angela Mahoney is a 63 y.o. female presenting on 05/15/2024 for Diarrhea (Diarrhea x 1 month. Having 1-2 bowel movements a day- large amount of soft stool. Denies vomiting. Tried Imodium- slight improvement. )  Patient reports diarrhea for the past month.  She is having 1-2 bowel movements per day while taking Imodium.  States her stools during the day are soft but they are watery before bed.  Denies any blood or mucus in her stool.  She was previously treated for a UTI and would like to repeat a urinalysis today to ensure bacteria is gone.    ROS: Negative unless specifically indicated above in HPI.   Relevant past medical history reviewed and updated as indicated.   Allergies and medications reviewed and updated.   Current Outpatient Medications:    albuterol  (VENTOLIN  HFA) 108 (90 Base) MCG/ACT inhaler, Inhale 1-2 puffs into the lungs every 6 (six) hours as needed for shortness of breath or wheezing., Disp: , Rfl:    Ascorbic Acid (VITAMIN C-ROSE HIPS CR) 1500 MG TBCR, Take 1,500 mg by mouth daily., Disp: , Rfl:    atorvastatin  (LIPITOR ) 80 MG tablet, Take 1 tablet (80 mg total) by mouth daily with supper., Disp: 90 tablet, Rfl: 1   b complex vitamins capsule, Take 1 capsule by mouth daily., Disp: , Rfl:    Baclofen  5 MG TABS, Take 1 tablet (5 mg total) by mouth at bedtime., Disp: 90 tablet, Rfl: 0   buPROPion  (WELLBUTRIN  XL) 150 MG 24 hr tablet, Take 1 tablet (150 mg total) by mouth every morning., Disp: 30 tablet, Rfl: 2   busPIRone  (BUSPAR ) 15 MG tablet, TAKE ONE TABLET BY  MOUTH THREE TIMES DAILY, Disp: 270 tablet, Rfl: 0   Calcium  Citrate-Vitamin D  (CALCIUM  CITRATE + D PO), Take 1 tablet by mouth daily., Disp: , Rfl:    DULoxetine  (CYMBALTA ) 60 MG capsule, Take 2 capsules (120 mg total) by mouth at bedtime., Disp: , Rfl: 3   fludrocortisone  (FLORINEF ) 0.1 MG tablet, Take 0.2 mg by mouth daily., Disp: , Rfl:    fluticasone  (FLONASE ) 50 MCG/ACT nasal spray, Place 1 spray into both nostrils daily as needed for allergies or rhinitis., Disp: , Rfl:    gabapentin  (NEURONTIN ) 300 MG capsule, Take 3 capsules by mouth in the morning, 2 capsules in the afternoon and 2 capsules at night, Disp: 630 capsule, Rfl: 0   loratadine (CLARITIN) 10 MG tablet, Take by mouth., Disp: , Rfl:    Multiple Vitamin (MULTIVITAMIN WITH MINERALS) TABS tablet, Take 1 tablet by mouth daily., Disp: , Rfl:    ondansetron  (ZOFRAN -ODT) 4 MG disintegrating tablet, Take 1 tablet by mouth every 8 (eight) hours as needed for nausea/vomiting., Disp: , Rfl:    oxybutynin  (DITROPAN ) 5 MG tablet, Take 1 tablet (5 mg total) by mouth 2 (two) times daily., Disp: 180 tablet, Rfl: 0   pantoprazole  (PROTONIX ) 40 MG tablet, Take 1 tablet (40 mg total) by mouth daily., Disp: 30 tablet, Rfl: 5   topiramate (TOPAMAX) 50 MG tablet, TAKE ONE TABLET BY MOUTH ONE TIME DAILY, Disp: 90 tablet, Rfl:  0   famotidine  (PEPCID ) 10 MG tablet, Take 10 mg by mouth daily with breakfast., Disp: , Rfl:   Allergies  Allergen Reactions   Zolpidem Swelling    Tongue swelling, thrush symptoms.    Objective:   BP 124/69   Pulse 96   Temp 98.5 F (36.9 C)   Resp 16   Ht 5' 4 (1.626 m)   Wt 247 lb (112 kg)   SpO2 97%   BMI 42.40 kg/m    Physical Exam Vitals reviewed.  Constitutional:      General: She is not in acute distress.    Appearance: Normal appearance. She is not ill-appearing, toxic-appearing or diaphoretic.  HENT:     Head: Normocephalic and atraumatic.   Eyes:     General: No scleral icterus.       Right  eye: No discharge.        Left eye: No discharge.     Conjunctiva/sclera: Conjunctivae normal.    Cardiovascular:     Rate and Rhythm: Normal rate.  Pulmonary:     Effort: Pulmonary effort is normal. No respiratory distress.  Abdominal:     General: Bowel sounds are normal.     Palpations: Abdomen is soft.     Tenderness: There is no abdominal tenderness.   Musculoskeletal:        General: Normal range of motion.     Cervical back: Normal range of motion.   Skin:    General: Skin is warm and dry.     Capillary Refill: Capillary refill takes less than 2 seconds.   Neurological:     General: No focal deficit present.     Mental Status: She is alert and oriented to person, place, and time. Mental status is at baseline.   Psychiatric:        Mood and Affect: Mood normal.        Behavior: Behavior normal.        Thought Content: Thought content normal.        Judgment: Judgment normal.

## 2024-05-17 LAB — URINALYSIS
Bilirubin Urine: NEGATIVE
Hgb urine dipstick: NEGATIVE
Ketones, ur: NEGATIVE
Leukocytes,Ua: NEGATIVE
Nitrite: NEGATIVE
Specific Gravity, Urine: 1.01 (ref 1.000–1.030)
Total Protein, Urine: NEGATIVE
Urine Glucose: NEGATIVE
Urobilinogen, UA: 0.2 (ref 0.0–1.0)
pH: 7.5 (ref 5.0–8.0)

## 2024-05-18 ENCOUNTER — Ambulatory Visit: Admitting: Psychology

## 2024-05-18 ENCOUNTER — Ambulatory Visit: Payer: Self-pay | Admitting: Family Medicine

## 2024-05-18 DIAGNOSIS — F331 Major depressive disorder, recurrent, moderate: Secondary | ICD-10-CM

## 2024-05-18 DIAGNOSIS — F4321 Adjustment disorder with depressed mood: Secondary | ICD-10-CM

## 2024-05-18 DIAGNOSIS — F4010 Social phobia, unspecified: Secondary | ICD-10-CM | POA: Diagnosis not present

## 2024-05-18 LAB — GI PROFILE, STOOL, PCR

## 2024-05-18 NOTE — Progress Notes (Signed)
 [facility] Counselor/Therapist Progress Note  Patient ID: Angela Mahoney, MRN: [id number],    Date: 05/18/2024  Time Spent: 8:10am-8:53am   Pt is seen for a virtual video visit via caregility.    Pt joins from her home, reporting privacy, and counselor from her home office.  Pt consents of virtual visits and is aware of limitation so such visits.  Treatment Type: Individual Therapy  Reported Symptoms: pt reports conflict w/ daughter yesterday and some anger and sadness.  Mental Status Exam: Appearance:  Well Groomed     Behavior: Appropriate  Motor: Normal  Speech/Language:  Clear and Coherent  Affect: Appropriate  Mood: depressed  Thought process: normal  Thought content:   WNL  Sensory/Perceptual disturbances:   WNL  Orientation: oriented to person, place, time/date, and situation  Attention: Good  Concentration: Good  Memory: WNL  Fund of knowledge:  Good  Insight:   Good  Judgment:  Good  Impulse Control: Good   Risk Assessment: Danger to Self:  No Self-injurious Behavior: No Danger to Others: No Duty to Warn:no Physical Aggression / Violence:No  Access to Firearms a concern: No  Gang Involvement:No   Subjective: Counselor assessed pt current functioning per pt report.  Processed w/ pt recent interaction w/ daughter.  Discussed pt emotions and explored related thoughts and distortions.  Discussed conflict resolution and interactions moving forward.  Pt affect wnl.  Pt reports she wasn't able to come in as planned as still dealing w/ diarrhea and had appt earlier this week and awaiting results of stool sample.  Pt reports her and daughter had conflict yesterday and daughter expressed that only comes once a week as feels burdened by them- she asks too much.  Pt expressed hurt by and recognized daughter may not have meant how came out, but still very hurt by and states will no longer ask of things from her.  Pt discussed that did end positively- that  she expressed it was good for her daughter to get out what feeling as knew something was creating tension and expressed love for each other.  Pt discussed how this reinforced her want to move back to [place] .    Interventions: Cognitive Behavioral Therapy, Solution-Oriented/Positive Psychology, and supportive  Diagnosis:Major depressive disorder, recurrent episode, moderate (HCC)  Social anxiety disorder  Grief  Plan: Pt to f/u in 2 weeks w/ counseling.  Pt to f/u as scheduled w/ PCP and specialist as scheduled.     ndividualized Treatment Plan Strengths: seeking counseling.  Pt enjoys crocheting.   Supports: husband and daughter   Goal/Needs for Treatment:  In order of importance to patient 1) coping w/ depression and anxiety 2) work on grief related to losses.   3) --   Client Statement of Needs: I need to work though stressors and losses- My daughter's stuff, my mom's stuff, my grief w/ my mom, Work on my stuff with him (husband) and losing him.   Treatment Level:outpatient counseling  Symptoms:depression, anxiety, worry, fatigue, grief, sleep disturbance  Client Treatment Preferences:Weekly to biweekly counseling.   Healthcare consumer's goal for treatment:  Counselor, Damien Herald, [facility] will support the patient's ability to achieve the goals identified. Cognitive Behavioral Therapy, Assertive Communication/Conflict Resolution Training, Relaxation Training, ACT, Humanistic and other evidenced-based practices will be used to promote progress towards healthy functioning.   Healthcare consumer will: Actively participate in therapy, working towards healthy functioning.    *Justification for Continuation/Discontinuation of Goal: R=Revised, O=Ongoing, A=Achieved, D=Discontinued  Goal 1)  Manage depression and anxiety, reframing related thoughts, self care and behavioral management AEB pt report of decreased symptoms and therapist observation.  Baseline date 03/16/24: Progress  towards goal 0; How Often - Daily Target Date Goal Was reviewed Status Code Progress towards goal/Likert rating  03/16/25                Goal 2) Increase effective coping w/ losses and grief through effective expression of feelings AEB pt verbalizing in session and therapist observation.  Baseline date 03/16/24: Progress towards goal 0/; How Often - Daily Target Date Goal Was reviewed Status Code Progress towards goal  03/16/25                 This plan has been reviewed and created by the following participants:  This plan will be reviewed at least every 12 months. Date Behavioral Health Clinician Date Guardian/Patient   03/16/24 [facility] Barbarann [facility] 03/16/24 Verbal Consent Provided and signature on file             [place], [facility]

## 2024-05-22 ENCOUNTER — Other Ambulatory Visit: Payer: Self-pay | Admitting: Family Medicine

## 2024-05-23 DIAGNOSIS — R630 Anorexia: Secondary | ICD-10-CM | POA: Diagnosis not present

## 2024-05-24 ENCOUNTER — Ambulatory Visit (INDEPENDENT_AMBULATORY_CARE_PROVIDER_SITE_OTHER): Admitting: Psychology

## 2024-05-24 DIAGNOSIS — F4321 Adjustment disorder with depressed mood: Secondary | ICD-10-CM

## 2024-05-24 DIAGNOSIS — F4010 Social phobia, unspecified: Secondary | ICD-10-CM | POA: Diagnosis not present

## 2024-05-24 DIAGNOSIS — F331 Major depressive disorder, recurrent, moderate: Secondary | ICD-10-CM | POA: Diagnosis not present

## 2024-05-24 NOTE — Progress Notes (Signed)
 [facility] Counselor/Therapist Progress Note  Patient ID: Angela Mahoney, MRN: [id number],    Date: 05/24/2024  Time Spent: 11:01am-11:50am   Pt is seen for a virtual video visit via caregility.    Pt joins from her home, reporting privacy, and counselor from her home office.  Pt consents of virtual visits and is aware of limitation so such visits.  Treatment Type: Individual Therapy  Reported Symptoms: pt reports continue to not feel well physically.  Pt reports interaction w/ daughter good, some worry for tonight.    Mental Status Exam: Appearance:  Well Groomed     Behavior: Appropriate  Motor: Normal  Speech/Language:  Clear and Coherent  Affect: Appropriate  Mood: anxious and depressed  Thought process: normal  Thought content:   WNL  Sensory/Perceptual disturbances:   WNL  Orientation: oriented to person, place, time/date, and situation  Attention: Good  Concentration: Good  Memory: WNL  Fund of knowledge:  Good  Insight:   Good  Judgment:  Good  Impulse Control: Good   Risk Assessment: Danger to Self:  No Self-injurious Behavior: No Danger to Others: No Duty to Warn:no Physical Aggression / Violence:No  Access to Firearms a concern: No  Gang Involvement:No   Subjective: Counselor assessed pt current functioning per pt report.  Processed w/ pt physical health and impact on mood.  Explored w/pt interactions w/ daughter and focus on positive communication skills.  discussed losses and grief and resources for support groups.  Pt affect congruent w/ report of not feeling well.  Pt reports continuing w/ stomach upset/diarrhea and no answers re:SABRA  Pt reports that her nail visit w/ daughter and granddaughter went well last week.  Pt has some worry about interaction w/ daughter coming to refill medication boxes tonight.  Pt recognized focus on effective and assertive communication.  Pt discussed want for some travel and how used to travel a lot w/  husband.  Pt discussed some losses and how her online support group has been positive an receptive to resource for in person grief support.      Interventions: Cognitive Behavioral Therapy, Assertiveness/Communication, and supportive  Diagnosis:Major depressive disorder, recurrent episode, moderate (HCC)  Social anxiety disorder  Grief  Plan: Pt to f/u in 2 weeks w/ counseling.  Pt to f/u as scheduled w/ PCP and specialist as scheduled.     ndividualized Treatment Plan Strengths: seeking counseling.  Pt enjoys crocheting.   Supports: husband and daughter   Goal/Needs for Treatment:  In order of importance to patient 1) coping w/ depression and anxiety 2) work on grief related to losses.   3) --   Client Statement of Needs: I need to work though stressors and losses- My daughter's stuff, my mom's stuff, my grief w/ my mom, Work on my stuff with him (husband) and losing him.   Treatment Level:outpatient counseling  Symptoms:depression, anxiety, worry, fatigue, grief, sleep disturbance  Client Treatment Preferences:Weekly to biweekly counseling.   Healthcare consumer's goal for treatment:  Counselor, Damien Herald, [facility] will support the patient's ability to achieve the goals identified. Cognitive Behavioral Therapy, Assertive Communication/Conflict Resolution Training, Relaxation Training, ACT, Humanistic and other evidenced-based practices will be used to promote progress towards healthy functioning.   Healthcare consumer will: Actively participate in therapy, working towards healthy functioning.    *Justification for Continuation/Discontinuation of Goal: R=Revised, O=Ongoing, A=Achieved, D=Discontinued  Goal 1) Manage depression and anxiety, reframing related thoughts, self care and behavioral management AEB pt report of decreased  symptoms and therapist observation.  Baseline date 03/16/24: Progress towards goal 0; How Often - Daily Target Date Goal Was reviewed Status Code  Progress towards goal/Likert rating  03/16/25                Goal 2) Increase effective coping w/ losses and grief through effective expression of feelings AEB pt verbalizing in session and therapist observation.  Baseline date 03/16/24: Progress towards goal 0/; How Often - Daily Target Date Goal Was reviewed Status Code Progress towards goal  03/16/25                 This plan has been reviewed and created by the following participants:  This plan will be reviewed at least every 12 months. Date Behavioral Health Clinician Date Guardian/Patient   03/16/24 [facility] Barbarann [facility] 03/16/24 Verbal Consent Provided and signature on file              [place], [facility]

## 2024-05-30 ENCOUNTER — Ambulatory Visit: Admitting: Family Medicine

## 2024-05-30 ENCOUNTER — Telehealth (INDEPENDENT_AMBULATORY_CARE_PROVIDER_SITE_OTHER): Admitting: Family Medicine

## 2024-05-30 ENCOUNTER — Encounter: Payer: Self-pay | Admitting: Family Medicine

## 2024-05-30 DIAGNOSIS — M797 Fibromyalgia: Secondary | ICD-10-CM | POA: Diagnosis not present

## 2024-05-30 DIAGNOSIS — R7303 Prediabetes: Secondary | ICD-10-CM

## 2024-05-30 DIAGNOSIS — F419 Anxiety disorder, unspecified: Secondary | ICD-10-CM | POA: Diagnosis not present

## 2024-05-30 DIAGNOSIS — R197 Diarrhea, unspecified: Secondary | ICD-10-CM

## 2024-05-30 DIAGNOSIS — F331 Major depressive disorder, recurrent, moderate: Secondary | ICD-10-CM

## 2024-05-30 MED ORDER — DULOXETINE HCL 60 MG PO CPEP: 120.0000 mg | ORAL_CAPSULE | Freq: Every day | ORAL | 2 refills | Status: AC

## 2024-05-30 MED ORDER — BUPROPION HCL ER (XL) 150 MG PO TB24: 150.0000 mg | ORAL_TABLET | ORAL | 1 refills | Status: DC

## 2024-05-30 NOTE — Progress Notes (Signed)
 MyChart Video Visit    Virtual Visit via Video Note    Patient location: Home. Patient and provider in visit Provider location: Office 2 patient identifiers used.  I discussed the limitations of evaluation and management by telemedicine and the availability of in person appointments. The patient expressed understanding and agreed to proceed.  Visit Date: 05/30/2024  Today's healthcare provider: [name], NP-C     Subjective:    Patient ID: Angela Mahoney, female    DOB: [birth year], 63 y.o.   MRN: [id number]  Chief Complaint  Patient presents with   Medical Management of Chronic Issues    3 month f/u    HPI  She changed her appt from ov to virtual due to having diarrhea.  She has persistent diarrhea. Negative GI Profile last month.  States 3 times per week she has explosive, uncontrollable diarrhea. Other times of the week, her stools are loose.  States she had a colonoscopy but cannot recall when or where.   GERD- controlled with current medications.  Pantoprazole  very helpful.   Doing well on duloxetine  and Wellbutrin  for mood.  She sees a Veterinary surgeon but no longer has a psychiatrist.   She has been on Wellbutrin  for at least 1 year.     Past Medical History:  Diagnosis Date   Anemia    Anxiety    Arthritis    Back pain    Bilateral swelling of feet    Chest pain    Chronic fatigue syndrome    COPD (chronic obstructive pulmonary disease) (HCC) 2017   Coronary artery disease    Ozell Kubas, [place]   Depression    Dyspnea    with exertion   Falls    Fibromyalgia    GERD (gastroesophageal reflux disease)    Headache    High cholesterol    History of appendectomy 10/26/2022   History of fainting spells of unknown cause    ? r/t low blood pressure per daughter   Hypothyroidism    no longer treated   Joint pain    Myocardial infarction (HCC) 01/2016   widowmaker in [place]- had cardiac cath   Nicotine dependence, chewing tobacco, in  remission 01/01/2023   Prediabetes 10/06/2023   Rheumatoid arthritis (HCC)    Seasonal allergies    Sleep apnea    does not use cpap   SOB (shortness of breath)    Vitamin D  deficiency     Past Surgical History:  Procedure Laterality Date   BUNIONECTOMY Right 2002   CARDIAC CATHETERIZATION  01/23/2016   In [place]   COLONOSCOPY     x 2 at ages 18yr and 39yr - [place]   EXTERNAL FIXATION REMOVAL Right 08/06/2021   Procedure: REMOVAL EXTERNAL FIXATION LEG;  Surgeon: Angela Franky SQUIBB, MD;  Location: [facility] OR;  Service: Orthopedics;  Laterality: Right;  zimmer biomet ex-fix removal   LAPAROSCOPIC GASTRIC SLEEVE RESECTION  2017   [place]   MULTIPLE TOOTH EXTRACTIONS     has upper and lower dentures   ORIF ANKLE FRACTURE Right 08/06/2021   Procedure: OPEN REDUCTION INTERNAL FIXATION (ORIF) ANKLE FRACTURE;  Surgeon: Angela Franky SQUIBB, MD;  Location: [facility] OR;  Service: Orthopedics;  Laterality: Right;  smith and nephew   SYNDESMOSIS REPAIR Right 12/15/2021   Procedure: SYNDESMOSIS REPAIR;  Surgeon: Angela Franky SQUIBB, MD;  Location: [facility] OR;  Service: Orthopedics;  Laterality: Right;    Family History  Problem Relation Age of Onset   Cancer Mother  Heart disease Mother    Hyperlipidemia Mother    Alcoholism Mother    Alcoholism Father    Heart disease Brother     Social History   Socioeconomic History   Marital status: Married    Spouse name: Christopher   Number of children: 1   Years of education: Not on file   Highest education level: GED or equivalent  Occupational History   Occupation: Disabled  Tobacco Use   Smoking status: Former    Current packs/day: 0.00    Average packs/day: 0.5 packs/day for 45.0 years (22.5 ttl pk-yrs)    Types: Cigarettes    Start date: 34    Quit date: 2014    Years since quitting: 11.5   Smokeless tobacco: Never  Vaping Use   Vaping status: Never Used  Substance and Sexual Activity   Alcohol  use: Never   Drug use: Never   Sexual activity: Not  Currently  Other Topics Concern   Not on file  Social History Narrative   Lives with her husband. Has a dog   Social Drivers of Corporate investment banker Strain: Medium Risk (05/05/2024)   Overall Financial Resource Strain (CARDIA)    Difficulty of Paying Living Expenses: Somewhat hard  Food Insecurity: Food Insecurity Present (05/05/2024)   Hunger Vital Sign    Worried About Running Out of Food in the Last Year: Often true    Ran Out of Food in the Last Year: Often true  Transportation Needs: No Transportation Needs (05/05/2024)   PRAPARE - Administrator, Civil Service (Medical): No    Lack of Transportation (Non-Medical): No  Physical Activity: Inactive (05/05/2024)   Exercise Vital Sign    Days of Exercise per Week: 0 days    Minutes of Exercise per Session: Not on file  Stress: Stress Concern Present (05/05/2024)   [employer] of Occupational Health - Occupational Stress Questionnaire    Feeling of Stress: To some extent  Social Connections: Moderately Integrated (05/05/2024)   Social Connection and Isolation Panel    Frequency of Communication with Friends and Family: More than three times a week    Frequency of Social Gatherings with Friends and Family: Once a week    Attends Religious Services: 1 to 4 times per year    Active Member of [employer] or Organizations: No    Attends Engineer, structural: Not on file    Marital Status: Married  Catering manager Violence: Not At Risk (01/05/2024)   Received from [facility]   HITS    Over the last 12 months how often did your partner physically hurt you?: Never    Over the last 12 months how often did your partner insult you or talk down to you?: Never    Over the last 12 months how often did your partner threaten you with physical harm?: Never    Over the last 12 months how often did your partner scream or curse at you?: Never    Outpatient Medications Prior to Visit  Medication Sig Dispense Refill    albuterol  (VENTOLIN  HFA) 108 (90 Base) MCG/ACT inhaler Inhale 1-2 puffs into the lungs every 6 (six) hours as needed for shortness of breath or wheezing.     Ascorbic Acid (VITAMIN C-ROSE HIPS CR) 1500 MG TBCR Take 1,500 mg by mouth daily.     atorvastatin  (LIPITOR ) 80 MG tablet Take 1 tablet (80 mg total) by mouth daily with supper. 90 tablet 1  b complex vitamins capsule Take 1 capsule by mouth daily.     Baclofen  5 MG TABS TAKE ONE TABLET BY MOUTH DAILY AT BEDTIME 90 tablet 0   busPIRone  (BUSPAR ) 15 MG tablet TAKE ONE TABLET BY MOUTH THREE TIMES DAILY 270 tablet 0   Calcium  Citrate-Vitamin D  (CALCIUM  CITRATE + D PO) Take 1 tablet by mouth daily.     famotidine  (PEPCID ) 10 MG tablet Take 10 mg by mouth daily with breakfast.     fludrocortisone  (FLORINEF ) 0.1 MG tablet Take 0.2 mg by mouth daily.     fluticasone  (FLONASE ) 50 MCG/ACT nasal spray Place 1 spray into both nostrils daily as needed for allergies or rhinitis.     gabapentin  (NEURONTIN ) 300 MG capsule Take 3 capsules by mouth in the morning, 2 capsules in the afternoon and 2 capsules at night 630 capsule 0   loratadine (CLARITIN) 10 MG tablet Take by mouth.     Multiple Vitamin (MULTIVITAMIN WITH MINERALS) TABS tablet Take 1 tablet by mouth daily.     ondansetron  (ZOFRAN -ODT) 4 MG disintegrating tablet Take 1 tablet by mouth every 8 (eight) hours as needed for nausea/vomiting.     oxybutynin  (DITROPAN ) 5 MG tablet TAKE 1 TABLET TWICE A DAY 180 tablet 0   pantoprazole  (PROTONIX ) 40 MG tablet Take 1 tablet (40 mg total) by mouth daily. 30 tablet 5   topiramate (TOPAMAX) 50 MG tablet TAKE ONE TABLET BY MOUTH ONE TIME DAILY 90 tablet 0   buPROPion  (WELLBUTRIN  XL) 150 MG 24 hr tablet Take 1 tablet (150 mg total) by mouth every morning. 30 tablet 2   DULoxetine  (CYMBALTA ) 60 MG capsule Take 2 capsules (120 mg total) by mouth at bedtime.  3   No facility-administered medications prior to visit.    Allergies  Allergen Reactions   Zolpidem  Swelling    Tongue swelling, thrush symptoms.    ROS Denies fever, chills, dizziness, chest pain, palpitations, shortness of breath, abdominal pain, N/V, urinary symptoms, LE edema.      Objective:    Physical Exam  There were no vitals taken for this visit. Wt Readings from Last 3 Encounters:  05/15/24 247 lb (112 kg)  02/28/24 249 lb (112.9 kg)  01/12/24 248 lb (112.5 kg)   Alert and oriented in no acute distress.  Respirations unlabored.  Normal facial movements.  Normal speech.  Normal mood.    Assessment & Plan:   Problem List Items Addressed This Visit     Anxiety   Relevant Medications   DULoxetine  (CYMBALTA ) 60 MG capsule   buPROPion  (WELLBUTRIN  XL) 150 MG 24 hr tablet   Fibromyalgia   Relevant Medications   DULoxetine  (CYMBALTA ) 60 MG capsule   buPROPion  (WELLBUTRIN  XL) 150 MG 24 hr tablet   Prediabetes   Recurrent major depressive episodes, moderate (HCC)   Relevant Medications   DULoxetine  (CYMBALTA ) 60 MG capsule   buPROPion  (WELLBUTRIN  XL) 150 MG 24 hr tablet   Other Visit Diagnoses       Diarrhea, unspecified type    -  Primary   Relevant Orders   Ambulatory referral to Gastroenterology      Reviewed labs and GI profile which was negative.  Persistent diarrhea.  Referral to [facility] gastroenterology.  Advised her to stay well-hydrated.  Follow-up if feeling dehydrated or having any new or worsening symptoms. Reports her mood is stable on duloxetine  and Wellbutrin .  No longer has a psychiatrist.  Request refill of these medications.  She is seeing a  therapist regularly. Followed by cardiology and other specialists.  Recommend follow-up here in 3 months for fasting CPE.  I am having Chalene A. Molder Deb maintain her albuterol , ondansetron , Calcium  Citrate-Vitamin D  (CALCIUM  CITRATE + D PO), multivitamin with minerals, fluticasone , fludrocortisone , famotidine , VITAMIN C-ROSE HIPS CR, loratadine, topiramate, pantoprazole , b complex vitamins,  atorvastatin , busPIRone , oxybutynin , gabapentin , Baclofen , DULoxetine , and buPROPion .  Meds ordered this encounter  Medications   DULoxetine  (CYMBALTA ) 60 MG capsule    Sig: Take 2 capsules (120 mg total) by mouth at bedtime.    Dispense:  60 capsule    Refill:  2    Supervising Provider:   ROLLENE NORRIS A [[id number]]   buPROPion  (WELLBUTRIN  XL) 150 MG 24 hr tablet    Sig: Take 1 tablet (150 mg total) by mouth every morning.    Dispense:  90 tablet    Refill:  1    Supervising Provider:   ROLLENE NORRIS A [[id number]]    I discussed the assessment and treatment plan with the patient. The patient was provided an opportunity to ask questions and all were answered. The patient agreed with the plan and demonstrated an understanding of the instructions.   The patient was advised to call back or seek an in-person evaluation if the symptoms worsen or if the condition fails to improve as anticipated.     Angela Mackintosh, NP-C [facility] [place] [phone] (phone) [phone] (fax)  [facility]

## 2024-06-01 ENCOUNTER — Ambulatory Visit (INDEPENDENT_AMBULATORY_CARE_PROVIDER_SITE_OTHER): Admitting: Psychology

## 2024-06-01 DIAGNOSIS — F331 Major depressive disorder, recurrent, moderate: Secondary | ICD-10-CM | POA: Diagnosis not present

## 2024-06-01 DIAGNOSIS — F4010 Social phobia, unspecified: Secondary | ICD-10-CM

## 2024-06-01 DIAGNOSIS — F4321 Adjustment disorder with depressed mood: Secondary | ICD-10-CM | POA: Diagnosis not present

## 2024-06-01 NOTE — Progress Notes (Signed)
 [facility] Counselor/Therapist Progress Note  Patient ID: Angela Mahoney, MRN: [id number],    Date: 06/01/2024  Time Spent: 8:02am-8:52am   Pt is seen for a virtual video visit via caregility.    Pt joins from her home, reporting privacy, and counselor from her home office.  Pt consents of virtual visits and is aware of limitation so such visits.  Treatment Type: Individual Therapy  Reported Symptoms: pt reports continue to not feel well physically.  Pt reports sadness and grief w/ mom's anniversary of death.     Mental Status Exam: Appearance:  Well Groomed     Behavior: Appropriate  Motor: Normal  Speech/Language:  Clear and Coherent  Affect: Appropriate  Mood: depressed  Thought process: normal  Thought content:   WNL  Sensory/Perceptual disturbances:   WNL  Orientation: oriented to person, place, time/date, and situation  Attention: Good  Concentration: Good  Memory: WNL  Fund of knowledge:  Good  Insight:   Good  Judgment:  Good  Impulse Control: Good   Risk Assessment: Danger to Self:  No Self-injurious Behavior: No Danger to Others: No Duty to Warn:no Physical Aggression / Violence:No  Access to Firearms a concern: No  Gang Involvement:No   Subjective: Counselor assessed pt current functioning per pt report.  Processed w/ pt positives and stressors.  Explored grief w/ mom's anniversary of her death and ways she remembers and eels connected w/ her mom. Allowed pt to express feelings and story of her mom's death and reflecting on her grief process.  Pt affect wnl.  Pt reports that she is continuing to deal w/ digestive issues and has been referred to gastroenterologist.  Pt reports positive interactions w/ daughter recent.  Pt reports sadness this week and increasing w/ mom's death anniversary on tomorrow. Pt discussed her story of her death and how grieved her loss and was unexpected. Pt discussed ways she feels connected and receptive to idea  of memorializing gon her anniversary.  Pt shared that she wanted to get back on her grief support online group.  Interventions: Cognitive Behavioral Therapy and supportive  Diagnosis:Major depressive disorder, recurrent episode, moderate (HCC)  Social anxiety disorder  Grief  Plan: Pt to f/u in 1 week w/ counseling.  Pt to f/u as scheduled w/ PCP and specialist as scheduled.     ndividualized Treatment Plan Strengths: seeking counseling.  Pt enjoys crocheting.   Supports: husband and daughter   Goal/Needs for Treatment:  In order of importance to patient 1) coping w/ depression and anxiety 2) work on grief related to losses.   3) --   Client Statement of Needs: I need to work though stressors and losses- My daughter's stuff, my mom's stuff, my grief w/ my mom, Work on my stuff with him (husband) and losing him.   Treatment Level:outpatient counseling  Symptoms:depression, anxiety, worry, fatigue, grief, sleep disturbance  Client Treatment Preferences:Weekly to biweekly counseling.   Healthcare consumer's goal for treatment:  Counselor, Damien Herald, [facility] will support the patient's ability to achieve the goals identified. Cognitive Behavioral Therapy, Assertive Communication/Conflict Resolution Training, Relaxation Training, ACT, Humanistic and other evidenced-based practices will be used to promote progress towards healthy functioning.   Healthcare consumer will: Actively participate in therapy, working towards healthy functioning.    *Justification for Continuation/Discontinuation of Goal: R=Revised, O=Ongoing, A=Achieved, D=Discontinued  Goal 1) Manage depression and anxiety, reframing related thoughts, self care and behavioral management AEB pt report of decreased symptoms and therapist observation.  Baseline date 03/16/24: Progress towards goal 0; How Often - Daily Target Date Goal Was reviewed Status Code Progress towards goal/Likert rating  03/16/25                Goal  2) Increase effective coping w/ losses and grief through effective expression of feelings AEB pt verbalizing in session and therapist observation.  Baseline date 03/16/24: Progress towards goal 0/; How Often - Daily Target Date Goal Was reviewed Status Code Progress towards goal  03/16/25                 This plan has been reviewed and created by the following participants:  This plan will be reviewed at least every 12 months. Date Behavioral Health Clinician Date Guardian/Patient   03/16/24 [facility] 03/16/24 Verbal Consent Provided and signature on file            [place], [facility]

## 2024-06-07 DIAGNOSIS — Z6841 Body Mass Index (BMI) 40.0 and over, adult: Secondary | ICD-10-CM | POA: Diagnosis not present

## 2024-06-07 DIAGNOSIS — K2289 Other specified disease of esophagus: Secondary | ICD-10-CM | POA: Diagnosis not present

## 2024-06-07 DIAGNOSIS — E66813 Obesity, class 3: Secondary | ICD-10-CM | POA: Diagnosis not present

## 2024-06-07 DIAGNOSIS — I2510 Atherosclerotic heart disease of native coronary artery without angina pectoris: Secondary | ICD-10-CM | POA: Diagnosis not present

## 2024-06-08 ENCOUNTER — Ambulatory Visit: Admitting: Psychology

## 2024-06-08 DIAGNOSIS — F4010 Social phobia, unspecified: Secondary | ICD-10-CM | POA: Diagnosis not present

## 2024-06-08 DIAGNOSIS — F331 Major depressive disorder, recurrent, moderate: Secondary | ICD-10-CM | POA: Diagnosis not present

## 2024-06-08 DIAGNOSIS — F4321 Adjustment disorder with depressed mood: Secondary | ICD-10-CM | POA: Diagnosis not present

## 2024-06-08 NOTE — Progress Notes (Signed)
 [facility] Counselor/Therapist Progress Note  Patient ID: Angela Mahoney, MRN: [id number],    Date: 06/08/2024  Time Spent: 9:08am-9:53am   Pt is seen for a virtual video visit via caregility.    Pt joins from her home, reporting privacy, and counselor from her home office.  Pt consents of virtual visits and is aware of limitation so such visits.  Treatment Type: Individual Therapy  Reported Symptoms: pt reports tired today.  Pt continuing w/ illness and gastro issues.  Pt reports some sadness.        Mental Status Exam: Appearance:  Well Groomed     Behavior: Appropriate  Motor: Normal  Speech/Language:  Clear and Coherent  Affect: Appropriate  Mood: sad  Thought process: normal  Thought content:   WNL  Sensory/Perceptual disturbances:   WNL  Orientation: oriented to person, place, time/date, and situation  Attention: Good  Concentration: Good  Memory: WNL  Fund of knowledge:  Good  Insight:   Good  Judgment:  Good  Impulse Control: Good   Risk Assessment: Danger to Self:  No Self-injurious Behavior: No Danger to Others: No Duty to Warn:no Physical Aggression / Violence:No  Access to Firearms a concern: No  Gang Involvement:No   Subjective: Counselor assessed pt current functioning per pt report.  Processed w/ pt positives and stressors and mood.  Explored interactions w/ daughter and encouraged continued effective communication.  Dicussed ways of feeling connected w/ mom.   Pt affect wnl.  Pt reports fatigue today and last night.  Pt reports had cardio scan yesterday.  Pt reports still struggling w/ cold and w/ digestive issues.  Pt reports that did some coloring on mom's anniversary of death.  Pt reports feels connected w/ her through certain activities.  Pt reports that interactions w/ her daughter have been good and they are communicating well.     Interventions: Cognitive Behavioral Therapy and supportive  Diagnosis:Major depressive  disorder, recurrent episode, moderate (HCC)  Social anxiety disorder  Grief  Plan: Pt to f/u in 1 week w/ counseling.  Pt to f/u as scheduled w/ PCP and specialist as scheduled.     ndividualized Treatment Plan Strengths: seeking counseling.  Pt enjoys crocheting.   Supports: husband and daughter   Goal/Needs for Treatment:  In order of importance to patient 1) coping w/ depression and anxiety 2) work on grief related to losses.   3) --   Client Statement of Needs: I need to work though stressors and losses- My daughter's stuff, my mom's stuff, my grief w/ my mom, Work on my stuff with him (husband) and losing him.   Treatment Level:outpatient counseling  Symptoms:depression, anxiety, worry, fatigue, grief, sleep disturbance  Client Treatment Preferences:Weekly to biweekly counseling.   Healthcare consumer's goal for treatment:  Counselor, Damien Herald, [facility] will support the patient's ability to achieve the goals identified. Cognitive Behavioral Therapy, Assertive Communication/Conflict Resolution Training, Relaxation Training, ACT, Humanistic and other evidenced-based practices will be used to promote progress towards healthy functioning.   Healthcare consumer will: Actively participate in therapy, working towards healthy functioning.    *Justification for Continuation/Discontinuation of Goal: R=Revised, O=Ongoing, A=Achieved, D=Discontinued  Goal 1) Manage depression and anxiety, reframing related thoughts, self care and behavioral management AEB pt report of decreased symptoms and therapist observation.  Baseline date 03/16/24: Progress towards goal 0; How Often - Daily Target Date Goal Was reviewed Status Code Progress towards goal/Likert rating  03/16/25  Goal 2) Increase effective coping w/ losses and grief through effective expression of feelings AEB pt verbalizing in session and therapist observation.  Baseline date 03/16/24: Progress towards goal 0/; How  Often - Daily Target Date Goal Was reviewed Status Code Progress towards goal  03/16/25                 This plan has been reviewed and created by the following participants:  This plan will be reviewed at least every 12 months. Date Behavioral Health Clinician Date Guardian/Patient   03/16/24 [facility] 03/16/24 Verbal Consent Provided and signature on file              [place], [facility]

## 2024-06-12 ENCOUNTER — Telehealth: Payer: Self-pay | Admitting: Family Medicine

## 2024-06-12 NOTE — Telephone Encounter (Unsigned)
 Copied from CRM [id number]. Topic: General - Other >> Jun 12, 2024  2:25 PM Deleta S wrote: Reason for CRM: patient is calling about Gastro referral states no one has followed up with her regarding this matter and she would like to get scheduled for the appointment with the gastro. Please contact the patient at [phone]

## 2024-06-13 NOTE — Telephone Encounter (Signed)
 Called pt and provided [facility] number as she was referred there. Pt states she will give them a call to schedule

## 2024-06-15 ENCOUNTER — Ambulatory Visit (INDEPENDENT_AMBULATORY_CARE_PROVIDER_SITE_OTHER): Admitting: Psychology

## 2024-06-15 DIAGNOSIS — F4010 Social phobia, unspecified: Secondary | ICD-10-CM

## 2024-06-15 DIAGNOSIS — F4321 Adjustment disorder with depressed mood: Secondary | ICD-10-CM | POA: Diagnosis not present

## 2024-06-15 DIAGNOSIS — F331 Major depressive disorder, recurrent, moderate: Secondary | ICD-10-CM | POA: Diagnosis not present

## 2024-06-15 NOTE — Progress Notes (Signed)
 [facility] Counselor/Therapist Progress Note  Patient ID: Angela Mahoney, MRN: [id number],    Date: 06/15/2024  Time Spent: 8:10am-8:47am   Pt is seen for a virtual video visit via caregility.    Pt joins from her home, reporting privacy, and counselor from her home office.  Pt consents of virtual visits and is aware of limitation so such visits.  Treatment Type: Individual Therapy  Reported Symptoms: pt reports tired today with recent increased of gastro issues. Pt reports increasing worry about  Mental Status Exam: Appearance:  Well Groomed     Behavior: Appropriate  Motor: Normal  Speech/Language:  Clear and Coherent  Affect: Appropriate  Mood: anxious  Thought process: normal  Thought content:   WNL  Sensory/Perceptual disturbances:   WNL  Orientation: oriented to person, place, time/date, and situation  Attention: Good  Concentration: Good  Memory: WNL  Fund of knowledge:  Good  Insight:   Good  Judgment:  Good  Impulse Control: Good   Risk Assessment: Danger to Self:  No Self-injurious Behavior: No Danger to Others: No Duty to Warn:no Physical Aggression / Violence:No  Access to Firearms a concern: No  Gang Involvement:No   Subjective: Counselor assessed pt current functioning per pt report.  Processed w/ pt positives, stressors and mood.  Validated emotions and discussed steps taking to address health concerns.  Explored interactions w/ daughter and effective communication.  Reflected positive of recognizing her daughter's need for space.  Pt affect wnl.  Pt reports fatigue today as last night difficulty w/ gastro issues.  Pt reports she her her f/u w/ cardiologist tomorrow and scheduled w/ gastroenterologist in couple weeks.  Pt expressed worry that something may be wrong as ongoing w/ abnormal bowels for 2 months.  Pt recognized taking steps needs to get dx.  Pt reports her daughter was irritable when came over last night.  Pt didn't  internalize and recognized need for more space and not day to do errands initially planned.      Interventions: Cognitive Behavioral Therapy, Assertiveness/Communication, and supportive  Diagnosis:Major depressive disorder, recurrent episode, moderate (HCC)  Social anxiety disorder  Grief  Plan: Pt to f/u in 1 week w/ counseling.  Pt to f/u as scheduled w/ PCP and specialist as scheduled.     ndividualized Treatment Plan Strengths: seeking counseling.  Pt enjoys crocheting.   Supports: husband and daughter   Goal/Needs for Treatment:  In order of importance to patient 1) coping w/ depression and anxiety 2) work on grief related to losses.   3) --   Client Statement of Needs: I need to work though stressors and losses- My daughter's stuff, my mom's stuff, my grief w/ my mom, Work on my stuff with him (husband) and losing him.   Treatment Level:outpatient counseling  Symptoms:depression, anxiety, worry, fatigue, grief, sleep disturbance  Client Treatment Preferences:Weekly to biweekly counseling.   Healthcare consumer's goal for treatment:  Counselor, Damien Herald, [facility] will support the patient's ability to achieve the goals identified. Cognitive Behavioral Therapy, Assertive Communication/Conflict Resolution Training, Relaxation Training, ACT, Humanistic and other evidenced-based practices will be used to promote progress towards healthy functioning.   Healthcare consumer will: Actively participate in therapy, working towards healthy functioning.    *Justification for Continuation/Discontinuation of Goal: R=Revised, O=Ongoing, A=Achieved, D=Discontinued  Goal 1) Manage depression and anxiety, reframing related thoughts, self care and behavioral management AEB pt report of decreased symptoms and therapist observation.  Baseline date 03/16/24: Progress towards goal 0; How  Often - Daily Target Date Goal Was reviewed Status Code Progress towards goal/Likert rating  03/16/25                 Goal 2) Increase effective coping w/ losses and grief through effective expression of feelings AEB pt verbalizing in session and therapist observation.  Baseline date 03/16/24: Progress towards goal 0/; How Often - Daily Target Date Goal Was reviewed Status Code Progress towards goal  03/16/25                 This plan has been reviewed and created by the following participants:  This plan will be reviewed at least every 12 months. Date Behavioral Health Clinician Date Guardian/Patient   03/16/24 [facility] 03/16/24 Verbal Consent Provided and signature on file               [place], [facility]

## 2024-06-21 NOTE — Progress Notes (Unsigned)
 Angela Console, PA-C [address] [place], [place]  [address] Phone: [phone]   Gastroenterology Consultation  Referring Provider:     Lendia Boby CROME, NP-C Primary Care Physician:  Lendia Boby CROME, NP-C Primary Gastroenterologist:  Angela Console, PA-C / Dr. Gordy Starch  Reason for Consultation:     Diarrhea        HPI:   Angela Mahoney is a 63 y.o. y/o female referred for consultation & management  by Lendia Boby CROME, NP-C.  She is here today with her husband.  New patient.  Here to evaluate diarrhea.  Patient states she has had diarrhea for 2 months.  She has 2 loose bowel movements per day.  She has had a few days in the past 2 months where she had 1 formed bowel movement daily.  She is afraid to leave her house due to diarrhea.  She denies rectal bleeding, abdominal pain, nausea, vomiting, fever, chills, weight loss, heartburn, or dysphagia.  No recent travel, antibiotic use, new medications, or sick exposures.  Before 2 months ago she was having normal bowel movements.  She reports history of constipation in the remote past.    05/17/2024 GI pathogen panel negative through PCP.  Patient states she had a negative screening Cologuard a few years ago in [facility].  02/2024 last labs: Normal CBC, CMP, TSH.  Takes pantoprazole  40 Mg and famotidine  10 Mg daily for GERD.  Patient moved here 4 years ago from [place] .  She lived in [place]  for 7 years and saw a gastroenterologist there.  She states she had a normal colonoscopy approximately 7 years ago in [place] .  Before that she lived in [place]  for 7 years and had a colonoscopy there.  Before that she lived in [place] and had her first screening colonoscopy at age 73 in [place], reportedly normal.  She states she has had 3 previous colonoscopies in her life.  PMH: CAD, Hx MI, COPD, sleep apnea, GERD, hypothyroidism, fibromyalgia, depression, rheumatoid arthritis, chronic fatigue syndrome.  Prior appendectomy and  bariatric surgery.  Had gastric sleeve surgery in 2017 in [place] .  Still has gallbladder.  Past Medical History:  Diagnosis Date   Anemia    Anxiety    Arthritis    Back pain    Bilateral swelling of feet    Chest pain    Chronic fatigue syndrome    COPD (chronic obstructive pulmonary disease) (HCC) 2017   Coronary artery disease    Ozell Kubas, PA   Depression    Dyspnea    with exertion   Falls    Fibromyalgia    GERD (gastroesophageal reflux disease)    Headache    High cholesterol    History of appendectomy 10/26/2022   History of fainting spells of unknown cause    ? 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   Prediabetes 10/06/2023   Rheumatoid arthritis (HCC)    Seasonal allergies    Sleep apnea    does not use cpap   SOB (shortness of breath)    Vitamin D  deficiency     Past Surgical History:  Procedure Laterality Date   BUNIONECTOMY Right 2002   CARDIAC CATHETERIZATION  01/23/2016   In [place]   COLONOSCOPY  x 2 at ages 19yr and 14yr - [name]   EXTERNAL FIXATION REMOVAL Right 08/06/2021   Procedure: REMOVAL EXTERNAL FIXATION LEG;  Surgeon: Kendal Franky SQUIBB, MD;  Location: [facility] OR;  Service: Orthopedics;  Laterality: Right;  zimmer biomet ex-fix removal   LAPAROSCOPIC GASTRIC SLEEVE RESECTION  2017   [place]   MULTIPLE TOOTH EXTRACTIONS     has upper and lower dentures   ORIF ANKLE FRACTURE Right 08/06/2021   Procedure: OPEN REDUCTION INTERNAL FIXATION (ORIF) ANKLE FRACTURE;  Surgeon: Kendal Franky SQUIBB, MD;  Location: [facility] OR;  Service: Orthopedics;  Laterality: Right;  smith and nephew   SYNDESMOSIS REPAIR Right 12/15/2021   Procedure: SYNDESMOSIS REPAIR;  Surgeon: Kendal Franky SQUIBB, MD;  Location: [facility] OR;  Service: Orthopedics;  Laterality: Right;    Prior to  Admission medications   Medication Sig Start Date End Date Taking? Authorizing Provider  albuterol  (VENTOLIN  HFA) 108 (90 Base) MCG/ACT inhaler Inhale 1-2 puffs into the lungs every 6 (six) hours as needed for shortness of breath or wheezing. 02/07/20   [provider]  Ascorbic Acid (VITAMIN C-ROSE HIPS CR) 1500 MG TBCR Take 1,500 mg by mouth daily.    [provider]  atorvastatin  (LIPITOR ) 80 MG tablet Take 1 tablet (80 mg total) by mouth daily with supper. 03/07/24 09/03/24  Henson, Vickie L, NP-C  b complex vitamins capsule Take 1 capsule by mouth daily.    [provider]  Baclofen  5 MG TABS TAKE ONE TABLET BY MOUTH DAILY AT BEDTIME 05/22/24   Henson, Vickie L, NP-C  buPROPion  (WELLBUTRIN  XL) 150 MG 24 hr tablet Take 1 tablet (150 mg total) by mouth every morning. 05/30/24 11/26/24  Henson, Vickie L, NP-C  busPIRone  (BUSPAR ) 15 MG tablet TAKE ONE TABLET BY MOUTH THREE TIMES DAILY 04/11/24   Henson, Vickie L, NP-C  Calcium  Citrate-Vitamin D  (CALCIUM  CITRATE + D PO) Take 1 tablet by mouth daily.    [provider]  DULoxetine  (CYMBALTA ) 60 MG capsule Take 2 capsules (120 mg total) by mouth at bedtime. 05/30/24   Henson, Vickie L, NP-C  famotidine  (PEPCID ) 10 MG tablet Take 10 mg by mouth daily with breakfast.    [provider]  fludrocortisone  (FLORINEF ) 0.1 MG tablet Take 0.2 mg by mouth daily. 07/17/21   [provider]  fluticasone  (FLONASE ) 50 MCG/ACT nasal spray Place 1 spray into both nostrils daily as needed for allergies or rhinitis.    [provider]  gabapentin  (NEURONTIN ) 300 MG capsule Take 3 capsules by mouth in the morning, 2 capsules in the afternoon and 2 capsules at night 05/22/24   Henson, Vickie L, NP-C  loratadine (CLARITIN) 10 MG tablet Take by mouth. 09/25/22   [provider]  Multiple Vitamin (MULTIVITAMIN WITH MINERALS) TABS tablet Take 1 tablet by mouth daily.    [provider]  ondansetron   (ZOFRAN -ODT) 4 MG disintegrating tablet Take 1 tablet by mouth every 8 (eight) hours as needed for nausea/vomiting. 12/29/19   [provider]  oxybutynin  (DITROPAN ) 5 MG tablet TAKE 1 TABLET TWICE A DAY 05/22/24   Henson, Vickie L, NP-C  pantoprazole  (PROTONIX ) 40 MG tablet Take 1 tablet (40 mg total) by mouth daily. 01/12/24   Henson, Vickie L, NP-C  topiramate (TOPAMAX) 50 MG tablet TAKE ONE TABLET BY MOUTH ONE TIME DAILY 09/30/23   Tobie Gaines, DO    Family History  Problem Relation Age of Onset   Cancer Mother    Heart disease Mother  Hyperlipidemia Mother    Alcoholism Mother    Alcoholism Father    Heart disease Brother      Social History   Tobacco Use   Smoking status: Former    Current packs/day: 0.00    Average packs/day: 0.5 packs/day for 45.0 years (22.5 ttl pk-yrs)    Types: Cigarettes    Start date: [birth year]    Quit date: 2014    Years since quitting: 11.6   Smokeless tobacco: Never  Vaping Use   Vaping status: Never Used  Substance Use Topics   Alcohol  use: Never   Drug use: Never    Allergies as of 06/22/2024 - Review Complete 06/22/2024  Allergen Reaction Noted   Zolpidem Swelling 04/04/2020    Review of Systems:    All systems reviewed and negative except where noted in HPI.   Physical Exam:  BP 122/80   Pulse 85   Ht 5' 4 (1.626 m)   Wt 246 lb 4 oz (111.7 kg)   BMI 42.27 kg/m  No LMP recorded. Patient is postmenopausal.  General:   Alert,  Well-developed, well-nourished, pleasant and cooperative in NAD Lungs:  Respirations even and unlabored.  Clear throughout to auscultation.   No wheezes, crackles, or rhonchi. No acute distress. Heart:  Regular rate and rhythm; no murmurs, clicks, rubs, or gallops. Abdomen:  Normal bowel sounds.  No bruits.  Soft, and non-distended without masses, hepatosplenomegaly or hernias noted.  No Tenderness.  No guarding or rebound tenderness.    Neurologic:  Alert and oriented x3;  grossly normal  neurologically. Psych:  Alert and cooperative. Normal mood and affect.  Imaging Studies: No results found.  Labs: CBC    Component Value Date/Time   WBC 4.8 02/28/2024 1451   RBC 4.54 02/28/2024 1451   HGB 13.1 02/28/2024 1451   HGB 13.4 08/31/2023 0929   HCT 40.5 02/28/2024 1451   HCT 42.0 08/31/2023 0929   PLT 205.0 02/28/2024 1451   PLT 182 08/31/2023 0929   MCV 89.3 02/28/2024 1451   MCV 90 08/31/2023 0929    CMP     Component Value Date/Time   NA 141 02/28/2024 1451   NA 144 08/31/2023 0929   K 3.8 02/28/2024 1451   CL 108 02/28/2024 1451   CO2 26 02/28/2024 1451   GLUCOSE 86 02/28/2024 1451   BUN 10 02/28/2024 1451   BUN 11 08/31/2023 0929   CREATININE 0.85 02/28/2024 1451   CALCIUM  9.1 02/28/2024 1451   PROT 6.7 02/28/2024 1451   PROT 6.6 08/31/2023 0929   ALBUMIN 4.2 02/28/2024 1451   ALBUMIN 4.3 08/31/2023 0929   AST 25 02/28/2024 1451   ALT 21 02/28/2024 1451   ALKPHOS 64 02/28/2024 1451   BILITOT 0.3 02/28/2024 1451   BILITOT 0.3 08/31/2023 0929   GFRNONAA >60 12/15/2021 0559   GFRAA >60 04/03/2020 2209    Assessment and Plan:   Jenavie Stanczak is a 63 y.o. y/o female has been referred for:  1.  Mild diarrhea for 2 months.  Uncertain etiology.  Recent GI pathogen panel was negative for all infections.  She still has gallbladder.  Differential includes celiac, EPI, IBD, IBS, microscopic colitis, and overflow diarrhea from constipation.  Plan: - Abdominal x-ray 1 view, evaluate stool burden - Celiac labs - Stool test: Fecal calprotectin and fecal pancreatic elastase - Request last colonoscopy from [place]  for review (approximately 7 years ago) - If above test are unrevealing, and diarrhea persists, then repeat colonoscopy is next  step.  Follow up 4 to 6 weeks with TG.  Angela Console, PA-C

## 2024-06-22 ENCOUNTER — Encounter: Payer: Self-pay | Admitting: Physician Assistant

## 2024-06-22 ENCOUNTER — Ambulatory Visit (INDEPENDENT_AMBULATORY_CARE_PROVIDER_SITE_OTHER): Admitting: Psychology

## 2024-06-22 ENCOUNTER — Other Ambulatory Visit

## 2024-06-22 ENCOUNTER — Ambulatory Visit (INDEPENDENT_AMBULATORY_CARE_PROVIDER_SITE_OTHER)
Admission: RE | Admit: 2024-06-22 | Discharge: 2024-06-22 | Disposition: A | Discharge status: Home / Self Care | Source: Ambulatory Visit | Attending: Physician Assistant | Admitting: Physician Assistant

## 2024-06-22 ENCOUNTER — Ambulatory Visit (INDEPENDENT_AMBULATORY_CARE_PROVIDER_SITE_OTHER): Admitting: Physician Assistant

## 2024-06-22 VITALS — BP 122/80 | HR 85 | Ht 64.0 in | Wt 246.2 lb

## 2024-06-22 DIAGNOSIS — F4321 Adjustment disorder with depressed mood: Secondary | ICD-10-CM | POA: Diagnosis not present

## 2024-06-22 DIAGNOSIS — F4010 Social phobia, unspecified: Secondary | ICD-10-CM

## 2024-06-22 DIAGNOSIS — R197 Diarrhea, unspecified: Secondary | ICD-10-CM

## 2024-06-22 DIAGNOSIS — F331 Major depressive disorder, recurrent, moderate: Secondary | ICD-10-CM

## 2024-06-22 NOTE — Patient Instructions (Addendum)
 Your provider has requested that you go to the basement level for lab work before leaving today. Press B on the elevator. The lab is located at the first door on the left as you exit the elevator.  Before or after that, go to X Ray  _______________________________________________________  If your blood pressure at your visit was 140/90 or greater, please contact your primary care physician to follow up on this.  _______________________________________________________  If you are age 65 or older, your body mass index should be between 23-30. Your Body mass index is 42.27 kg/m. If this is out of the aforementioned range listed, please consider follow up with your Primary Care Provider.  If you are age 21 or younger, your body mass index should be between 19-25. Your Body mass index is 42.27 kg/m. If this is out of the aformentioned range listed, please consider follow up with your Primary Care Provider.   ________________________________________________________  The [facility] providers would like to encourage you to use MYCHART to communicate with providers for non-urgent requests or questions.  Due to long hold times on the telephone, sending your provider a message by [facility] may be a faster and more efficient way to get a response.  Please allow 48 business hours for a response.  Please remember that this is for non-urgent requests.  _______________________________________________________  [facility] is using a team-based approach to care.  Your team is made up of your doctor and two to three APPS. Our APPS (Nurse Practitioners and Physician Assistants) work with your physician to ensure care continuity for you. They are fully qualified to address your health concerns and develop a treatment plan. They communicate directly with your gastroenterologist to care for you. Seeing the Advanced Practice Practitioners on your physician's team can help you by facilitating care more  promptly, often allowing for earlier appointments, access to diagnostic testing, procedures, and other specialty referrals.

## 2024-06-22 NOTE — Progress Notes (Signed)
 [facility] Counselor/Therapist Progress Note  Patient ID: Angela Mahoney, MRN: [id number],    Date: 06/22/2024  Time Spent: 8:03am-8:51am   Pt is seen for a virtual video visit via caregility.    Pt joins from her home, reporting privacy, and counselor from her home office.  Pt consents of virtual visits and is aware of limitation so such visits.  Treatment Type: Individual Therapy  Reported Symptoms: pt reports continued gastro issues.  Pt reports disappointment that her grandaugters are more distant.  Mental Status Exam: Appearance:  Well Groomed     Behavior: Appropriate  Motor: Normal  Speech/Language:  Clear and Coherent  Affect: Appropriate  Mood: sad  Thought process: normal  Thought content:   WNL  Sensory/Perceptual disturbances:   WNL  Orientation: oriented to person, place, time/date, and situation  Attention: Good  Concentration: Good  Memory: WNL  Fund of knowledge:  Good  Insight:   Good  Judgment:  Good  Impulse Control: Good   Risk Assessment: Danger to Self:  No Self-injurious Behavior: No Danger to Others: No Duty to Warn:no Physical Aggression / Violence:No  Access to Firearms a concern: No  Gang Involvement:No   Subjective: Counselor assessed pt current functioning per pt report.  Processed w/ pt positives, stressors and mood.  Reflected positives of starting w/ gastroenterologist today.  Explored interactions w/ daughter and granddaughter interactions.  Discussed focus on what is in her control w/ interactions and communication.  Encouraged developing other social outlets for engaging.  Pt affect wnl.  Pt reports she is scheduled w/ gastroenterologist today and ready to have answers for gastro issues.  Pt reports that her granddaughters are distant w/ her and less engaged and feels that from her daughter sharing negatives about their interactions.  Pt recognized focus on her role in interactions as that is what she can control.   Pt agrees need to have other networks for self.    Interventions: Cognitive Behavioral Therapy, Assertiveness/Communication, and supportive  Diagnosis:Major depressive disorder, recurrent episode, moderate (HCC)  Social anxiety disorder  Grief  Plan: Pt to f/u in 1 week w/ counseling.  Pt to f/u as scheduled w/ PCP and specialist as scheduled.     ndividualized Treatment Plan Strengths: seeking counseling.  Pt enjoys crocheting.   Supports: husband and daughter   Goal/Needs for Treatment:  In order of importance to patient 1) coping w/ depression and anxiety 2) work on grief related to losses.   3) --   Client Statement of Needs: I need to work though stressors and losses- My daughter's stuff, my mom's stuff, my grief w/ my mom, Work on my stuff with him (husband) and losing him.   Treatment Level:outpatient counseling  Symptoms:depression, anxiety, worry, fatigue, grief, sleep disturbance  Client Treatment Preferences:Weekly to biweekly counseling.   Healthcare consumer's goal for treatment:  Counselor, Angela Mahoney, [facility] will support the patient's ability to achieve the goals identified. Cognitive Behavioral Therapy, Assertive Communication/Conflict Resolution Training, Relaxation Training, ACT, Humanistic and other evidenced-based practices will be used to promote progress towards healthy functioning.   Healthcare consumer will: Actively participate in therapy, working towards healthy functioning.    *Justification for Continuation/Discontinuation of Goal: R=Revised, O=Ongoing, A=Achieved, D=Discontinued  Goal 1) Manage depression and anxiety, reframing related thoughts, self care and behavioral management AEB pt report of decreased symptoms and therapist observation.  Baseline date 03/16/24: Progress towards goal 0; How Often - Daily Target Date Goal Was reviewed Status Code Progress towards  goal/Likert rating  03/16/25                Goal 2) Increase effective coping w/  losses and grief through effective expression of feelings AEB pt verbalizing in session and therapist observation.  Baseline date 03/16/24: Progress towards goal 0/; How Often - Daily Target Date Goal Was reviewed Status Code Progress towards goal  03/16/25                 This plan has been reviewed and created by the following participants:  This plan will be reviewed at least every 12 months. Date Behavioral Health Clinician Date Guardian/Patient   03/16/24 [facility] 03/16/24 Verbal Consent Provided and signature on file                [place], [facility]

## 2024-06-25 LAB — CELIAC DISEASE AB SCREEN W/RFX
Antigliadin Abs, IgA: 5 U (ref 0–19)
IgA/Immunoglobulin A, Serum: 115 mg/dL (ref 87–352)
Transglutaminase IgA: <2 U/mL (ref 0–3)

## 2024-06-26 ENCOUNTER — Ambulatory Visit: Payer: Self-pay | Admitting: Physician Assistant

## 2024-06-26 DIAGNOSIS — R197 Diarrhea, unspecified: Secondary | ICD-10-CM

## 2024-06-26 DIAGNOSIS — R195 Other fecal abnormalities: Secondary | ICD-10-CM

## 2024-06-27 ENCOUNTER — Telehealth: Payer: Self-pay | Admitting: Physician Assistant

## 2024-06-27 DIAGNOSIS — R630 Anorexia: Secondary | ICD-10-CM | POA: Diagnosis not present

## 2024-06-27 NOTE — Telephone Encounter (Signed)
 Left message for pt to call back

## 2024-06-27 NOTE — Telephone Encounter (Signed)
 Patient called stating that she had already spoke with the lab.

## 2024-06-27 NOTE — Telephone Encounter (Signed)
 Noted

## 2024-06-27 NOTE — Telephone Encounter (Signed)
 Inbound call from patient stating she completed stool sample on Friday 8/8. States she put in the freezer until today, stated the Lab advised her that it is now expired and she will have to do another stool sample. Patient stated she will come tomorrow morning to pick up another kit. Please advise, thank you

## 2024-06-30 ENCOUNTER — Telehealth: Payer: Self-pay | Admitting: Physician Assistant

## 2024-06-30 NOTE — Telephone Encounter (Signed)
 I received patient's last colonoscopy done 09/2017 in [place]  which was normal. 10-year repeat screening colonoscopy will be due 09/2027. Please put repeat colon recall in for 09/2027. Ellouise Console, PA-C

## 2024-07-03 ENCOUNTER — Other Ambulatory Visit

## 2024-07-03 DIAGNOSIS — R197 Diarrhea, unspecified: Secondary | ICD-10-CM | POA: Diagnosis not present

## 2024-07-05 LAB — CALPROTECTIN, FECAL: Calprotectin, Fecal: 715 ug/g — ABNORMAL HIGH (ref 0–120)

## 2024-07-05 NOTE — Progress Notes (Signed)
 Call and notify pt. Fecal Calprotectin Stool test is Elevated, which is concerning for inflammatory bowel disease.  I recommend we go ahead and schedule colonoscopy in [facility] with any MD in pod A.  Diagnosis diarrhea and elevated fecal calprotectin.  Please go ahead and schedule colonoscopy.    She is scheduled for follow-up appointment with me 08/18/2024.  Postpone follow-up office visit until after colonoscopy.  Ellouise Console, PA-C

## 2024-07-06 ENCOUNTER — Ambulatory Visit (INDEPENDENT_AMBULATORY_CARE_PROVIDER_SITE_OTHER): Admitting: Psychology

## 2024-07-06 DIAGNOSIS — F4321 Adjustment disorder with depressed mood: Secondary | ICD-10-CM | POA: Diagnosis not present

## 2024-07-06 DIAGNOSIS — F331 Major depressive disorder, recurrent, moderate: Secondary | ICD-10-CM | POA: Diagnosis not present

## 2024-07-06 DIAGNOSIS — F4010 Social phobia, unspecified: Secondary | ICD-10-CM | POA: Diagnosis not present

## 2024-07-06 MED ORDER — NA SULFATE-K SULFATE-MG SULF 17.5-3.13-1.6 GM/177ML PO SOLN
ORAL | 0 refills | Status: DC
Start: 2024-07-06 — End: 2024-07-20
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
## 2024-07-09 LAB — PANCREATIC ELASTASE, FECAL: Pancreatic Elastase-1, Stool: 266 ug/g (ref >200)

## 2024-07-11 ENCOUNTER — Encounter: Payer: Self-pay | Admitting: Gastroenterology

## 2024-07-12 ENCOUNTER — Other Ambulatory Visit: Payer: Self-pay | Admitting: Family Medicine

## 2024-07-13 ENCOUNTER — Ambulatory Visit: Admitting: Psychology

## 2024-07-13 ENCOUNTER — Encounter (INDEPENDENT_AMBULATORY_CARE_PROVIDER_SITE_OTHER): Payer: Self-pay

## 2024-07-13 ENCOUNTER — Other Ambulatory Visit: Payer: Self-pay | Admitting: Medical Genetics

## 2024-07-13 DIAGNOSIS — F4010 Social phobia, unspecified: Secondary | ICD-10-CM | POA: Diagnosis not present

## 2024-07-13 DIAGNOSIS — F331 Major depressive disorder, recurrent, moderate: Secondary | ICD-10-CM

## 2024-07-13 NOTE — Progress Notes (Signed)
 [facility] Counselor/Therapist Progress Note  Patient ID: Angela Mahoney, MRN: [id number],    Date: 07/13/2024  Time Spent: 9:08am-9:58am   Pt is seen for a virtual video visit via caregility.    Pt joins from her home, reporting privacy, and counselor from her home office.  Pt consents of virtual visits and is aware of limitation so such visits.  Treatment Type: Individual Therapy  Reported Symptoms: pt reports worry w/ health, worry w/ interactions w/ daughter.   Mental Status Exam: Appearance:  Well Groomed     Behavior: Appropriate  Motor: Normal  Speech/Language:  Clear and Coherent  Affect: Appropriate  Mood: anxious  Thought process: normal  Thought content:   WNL  Sensory/Perceptual disturbances:   WNL  Orientation: oriented to person, place, time/date, and situation  Attention: Good  Concentration: Good  Memory: WNL  Fund of knowledge:  Good  Insight:   Good  Judgment:  Good  Impulse Control: Good   Risk Assessment: Danger to Self:  No Self-injurious Behavior: No Danger to Others: No Duty to Warn:no Physical Aggression / Violence:No  Access to Firearms a concern: No  Gang Involvement:No   Subjective: Counselor assessed pt current functioning per pt report.  Processed w/ pt continued stressors and related worries.  Explored interactions w/her daughter and keeping effective communication.  Discussed focus on what she can control and impact.   Pt affect wnl.  Pt reports she has colonoscopy next week and labs not good.  Pt reports some worry and hopes for answers and tx options.  Pt reports that busy w/ getting things done this week around house and for husband's health needs.  Pt reports she did have a fall last week and daughter upset that didn't tell her.  Pt reports a lot of tension w/ her daughter and that daughter doesn't share a lot or seek her assistance.  Pt feels frustration w/ and recognize not helpful to get passive aggressive and  get guarded w/ communication.  Pt reports going through house and trying to get rid of things and feels good w/ progress.   Interventions: Cognitive Behavioral Therapy, Assertiveness/Communication, and supportive  Diagnosis:Major depressive disorder, recurrent episode, moderate (HCC)  Social anxiety disorder  Plan: Pt to f/u in 2 week w/ counseling.  Pt to f/u as scheduled w/ PCP and specialist as scheduled.     ndividualized Treatment Plan Strengths: seeking counseling.  Pt enjoys crocheting.   Supports: husband and daughter   Goal/Needs for Treatment:  In order of importance to patient 1) coping w/ depression and anxiety 2) work on grief related to losses.   3) --   Client Statement of Needs: I need to work though stressors and losses- My daughter's stuff, my mom's stuff, my grief w/ my mom, Work on my stuff with him (husband) and losing him.   Treatment Level:outpatient counseling  Symptoms:depression, anxiety, worry, fatigue, grief, sleep disturbance  Client Treatment Preferences:Weekly to biweekly counseling.   Healthcare consumer's goal for treatment:  Counselor, Damien Herald, [facility] will support the patient's ability to achieve the goals identified. Cognitive Behavioral Therapy, Assertive Communication/Conflict Resolution Training, Relaxation Training, ACT, Humanistic and other evidenced-based practices will be used to promote progress towards healthy functioning.   Healthcare consumer will: Actively participate in therapy, working towards healthy functioning.    *Justification for Continuation/Discontinuation of Goal: R=Revised, O=Ongoing, A=Achieved, D=Discontinued  Goal 1) Manage depression and anxiety, reframing related thoughts, self care and behavioral management AEB pt report of  decreased symptoms and therapist observation.  Baseline date 03/16/24: Progress towards goal 0; How Often - Daily Target Date Goal Was reviewed Status Code Progress towards goal/Likert rating   03/16/25                Goal 2) Increase effective coping w/ losses and grief through effective expression of feelings AEB pt verbalizing in session and therapist observation.  Baseline date 03/16/24: Progress towards goal 0/; How Often - Daily Target Date Goal Was reviewed Status Code Progress towards goal  03/16/25                 This plan has been reviewed and created by the following participants:  This plan will be reviewed at least every 12 months. Date Behavioral Health Clinician Date Guardian/Patient   03/16/24 [facility] Barbarann [facility] 03/16/24 Verbal Consent Provided and signature on file                [place], [facility]

## 2024-07-20 ENCOUNTER — Encounter: Payer: Self-pay | Admitting: Gastroenterology

## 2024-07-20 ENCOUNTER — Ambulatory Visit (AMBULATORY_SURGERY_CENTER): Admitting: Gastroenterology

## 2024-07-20 VITALS — BP 110/73 | HR 72 | Temp 97.7°F | Resp 12 | Ht 64.0 in | Wt 246.0 lb

## 2024-07-20 DIAGNOSIS — K648 Other hemorrhoids: Secondary | ICD-10-CM

## 2024-07-20 DIAGNOSIS — K529 Noninfective gastroenteritis and colitis, unspecified: Secondary | ICD-10-CM

## 2024-07-20 DIAGNOSIS — Q439 Congenital malformation of intestine, unspecified: Secondary | ICD-10-CM | POA: Diagnosis not present

## 2024-07-20 DIAGNOSIS — D123 Benign neoplasm of transverse colon: Secondary | ICD-10-CM

## 2024-07-20 DIAGNOSIS — R197 Diarrhea, unspecified: Secondary | ICD-10-CM | POA: Diagnosis not present

## 2024-07-20 MED ORDER — SODIUM CHLORIDE 0.9 % IV SOLN: 500.0000 mL | Freq: Once | INTRAVENOUS | Status: DC

## 2024-07-20 NOTE — Progress Notes (Signed)
 Called to room to assist during endoscopic procedure.  Patient ID and intended procedure confirmed with present staff. Received instructions for my participation in the procedure from the performing physician.

## 2024-07-20 NOTE — Progress Notes (Signed)
 History and Physical Interval Note: Note reviewed from 8/7. Persistent diarrhea, extensive stool testing. Negative for infection, pancreatic elastase normal, but fecal calprotectin in the 700s. Reportedly a normal colonoscopy 7 years ago. Colonoscopy to further evaluate in light of persistent symptoms and elevated fecal calprotectin. She states symptoms have persisted since office visit, unchanged. Denies any cardiopulmonary symptoms today, otherwise feels well and wishes to proceed.     07/20/2024 11:04 AM  Angela Mahoney  has presented today for endoscopic procedure(s), with the diagnosis of  Encounter Diagnosis  Name Primary?   Diarrhea, unspecified type Yes  .  The various methods of evaluation and treatment have been discussed with the patient and/or family. After consideration of risks, benefits and other options for treatment, the patient has consented to  the endoscopic procedure(s).   The patient's history has been reviewed, patient examined, no change in status, stable for surgery.  I have reviewed the patient's chart and labs.  Questions were answered to the patient's satisfaction.    Marcey Naval, MD [facility]

## 2024-07-20 NOTE — Patient Instructions (Signed)
Resume previous diet and medications. Awaiting pathology results. Repeat Colonoscopy date to be determined based on pathology results.  Handout provided on:Colon polyps and Hemorrhoids  YOU HAD AN ENDOSCOPIC PROCEDURE TODAY AT THE [facility]:   Refer to the procedure report that was given to you for any specific questions about what was found during the examination.  If the procedure report does not answer your questions, please call your gastroenterologist to clarify.  If you requested that your care partner not be given the details of your procedure findings, then the procedure report has been included in a sealed envelope for you to review at your convenience later.  YOU SHOULD EXPECT: Some feelings of bloating in the abdomen. Passage of more gas than usual.  Walking can help get rid of the air that was put into your GI tract during the procedure and reduce the bloating. If you had a lower endoscopy (such as a colonoscopy or flexible sigmoidoscopy) you may notice spotting of blood in your stool or on the toilet paper. If you underwent a bowel prep for your procedure, you may not have a normal bowel movement for a few days.  Please Note:  You might notice some irritation and congestion in your nose or some drainage.  This is from the oxygen used during your procedure.  There is no need for concern and it should clear up in a day or so.  SYMPTOMS TO REPORT IMMEDIATELY:  Following lower endoscopy (colonoscopy or flexible sigmoidoscopy):  Excessive amounts of blood in the stool  Significant tenderness or worsening of abdominal pains  Swelling of the abdomen that is new, acute  Fever of 100F or higher  For urgent or emergent issues, a gastroenterologist can be reached at any hour by calling [phone]. Do not use MyChart messaging for urgent concerns.    DIET:  We do recommend a small meal at first, but then you may proceed to your regular diet.  Drink plenty of fluids but you  should avoid alcoholic beverages for 24 hours.  ACTIVITY:  You should plan to take it easy for the rest of today and you should NOT DRIVE or use heavy machinery until tomorrow (because of the sedation medicines used during the test).    FOLLOW UP: Our staff will call the number listed on your records the next business day following your procedure.  We will call around 7:15- 8:00 am to check on you and address any questions or concerns that you may have regarding the information given to you following your procedure. If we do not reach you, we will leave a message.     If any biopsies were taken you will be contacted by phone or by letter within the next 1-3 weeks.  Please call us at [phone] if you have not heard about the biopsies in 3 weeks.    SIGNATURES/CONFIDENTIALITY: You and/or your care partner have signed paperwork which will be entered into your electronic medical record.  These signatures attest to the fact that that the information above on your After Visit Summary has been reviewed and is understood.  Full responsibility of the confidentiality of this discharge information lies with you and/or your care-partner. 

## 2024-07-20 NOTE — Progress Notes (Signed)
 Report given to PACU, vss

## 2024-07-20 NOTE — Progress Notes (Signed)
 1108 Robinul 0.1 mg IV given due large amount of secretions upon assessment.  Patient experiencing nausea.  MD updated and Zofran  4 mg IV given, vss

## 2024-07-20 NOTE — Op Note (Signed)
 [facility] Patient Name: Angela Mahoney Procedure Date: 07/20/2024 10:56 AM MRN: [id number] Endoscopist: Elspeth P. Leigh , MD, [id number] Age: 63 Referring MD:  Date of Birth: [birth year] Gender: Female Account #: [id number] Procedure:                Colonoscopy Indications:              Persistent diarrhea - fecal calprotectin elevated                            to the 700s Medicines:                Monitored Anesthesia Care Procedure:                Pre-Anesthesia Assessment:                           - Prior to the procedure, a History and Physical                            was performed, and patient medications and                            allergies were reviewed. The patient's tolerance of                            previous anesthesia was also reviewed. The risks                            and benefits of the procedure and the sedation                            options and risks were discussed with the patient.                            All questions were answered, and informed consent                            was obtained. Prior Anticoagulants: The patient has                            taken no anticoagulant or antiplatelet agents. ASA                            Grade Assessment: III - A patient with severe                            systemic disease. After reviewing the risks and                            benefits, the patient was deemed in satisfactory                            condition to undergo the procedure.  After obtaining informed consent, the colonoscope                            was passed under direct vision. Throughout the                            procedure, the patient's blood pressure, pulse, and                            oxygen saturations were monitored continuously. The                            Olympus Scope SN [id number] was introduced through the                            anus and advanced to the the  terminal ileum, with                            identification of the appendiceal orifice and IC                            valve. The colonoscopy was performed without                            difficulty. The patient tolerated the procedure                            well. The quality of the bowel preparation was                            adequate. The terminal ileum, ileocecal valve,                            appendiceal orifice, and rectum were photographed. Scope In: 11:16:08 AM Scope Out: 11:38:50 AM Scope Withdrawal Time: 0 hours 15 minutes 19 seconds  Total Procedure Duration: 0 hours 22 minutes 42 seconds  Findings:                 The perianal and digital rectal examinations were                            normal.                           The terminal ileum appeared normal.                           A 5 mm polyp was found in the transverse colon. The                            polyp was sessile. The polyp was removed with a                            cold snare. Resection and retrieval were complete.  Internal hemorrhoids were found during retroflexion.                           A large amount of stool was found in the entire                            colon, making visualization difficult. Lavage of                            the colon was performed using copious amounts of                            fluid, resulting in clearance with mostly adequate                            visualization. Residual vegetable matter settled in                            dependant portions of the colon which was largely                            cleared / lavaged for adequate visualization. Small                            portion of cecum was difficult to clear.                           The colon was tortous. The exam was otherwise                            without abnormality.                           Biopsies for histology were taken with a cold                             forceps from the right colon, left colon and                            transverse colon for evaluation of microscopic                            colitis. Complications:            No immediate complications. Estimated blood loss:                            Minimal. Estimated Blood Loss:     Estimated blood loss was minimal. Impression:               - The examined portion of the ileum was normal.                           - One 5 mm polyp in the transverse colon, removed  with a cold snare. Resected and retrieved.                           - Internal hemorrhoids.                           - Stool / residual vegetable matter in the entire                            examined colon - lavaged with mostly adequate                            views. Cecum difficult to entirely clear.                           - Tortous colon.                           - The examination was otherwise normal.                           - Biopsies were taken with a cold forceps from the                            right colon, left colon and transverse colon for                            evaluation of microscopic colitis. Recommendation:           - Patient has a contact number available for                            emergencies. The signs and symptoms of potential                            delayed complications were discussed with the                            patient. Return to normal activities tomorrow.                            Written discharge instructions were provided to the                            patient.                           - Resume previous diet.                           - Continue present medications.                           - Await pathology results with further  recommendations. Elspeth P. Noach Calvillo, MD 07/20/2024 11:49:14 AM This report has been signed electronically.

## 2024-07-21 ENCOUNTER — Telehealth: Payer: Self-pay | Admitting: *Deleted

## 2024-07-21 NOTE — Telephone Encounter (Signed)
 Left message on f/u call

## 2024-07-24 LAB — SURGICAL PATHOLOGY

## 2024-07-25 ENCOUNTER — Ambulatory Visit (INDEPENDENT_AMBULATORY_CARE_PROVIDER_SITE_OTHER): Admitting: Psychology

## 2024-07-25 DIAGNOSIS — F4321 Adjustment disorder with depressed mood: Secondary | ICD-10-CM

## 2024-07-25 DIAGNOSIS — F4010 Social phobia, unspecified: Secondary | ICD-10-CM | POA: Diagnosis not present

## 2024-07-25 DIAGNOSIS — F331 Major depressive disorder, recurrent, moderate: Secondary | ICD-10-CM

## 2024-07-25 NOTE — Progress Notes (Addendum)
 [facility] Counselor/Therapist Progress Note  Patient ID: Angela Mahoney, MRN: [id number],    Date: 07/25/2024  Time Spent: 9:00am-9:53am   Pt is seen for a virtual video visit via caregility.    Pt joins from her home, reporting privacy, and counselor from her home office.  Pt consents of virtual visits and is aware of limitation so such visits.  Treatment Type: Individual Therapy  Reported Symptoms: pt reports stress w/ health issues   Mental Status Exam: Appearance:  Well Groomed     Behavior: Appropriate  Motor: Normal  Speech/Language:  Clear and Coherent  Affect: Appropriate  Mood: anxious  Thought process: normal  Thought content:   WNL  Sensory/Perceptual disturbances:   WNL  Orientation: oriented to person, place, time/date, and situation  Attention: Good  Concentration: Good  Memory: WNL  Fund of knowledge:  Good  Insight:   Good  Judgment:  Good  Impulse Control: Good   Risk Assessment: Danger to Self:  No Self-injurious Behavior: No Danger to Others: No Duty to Warn:no Physical Aggression / Violence:No  Access to Firearms a concern: No  Gang Involvement:No   Subjective: Counselor assessed pt current functioning per pt report.  Processed w/ pt continued stressors with health.  Discussed next steps and reflected positive of pt advocating for self.    Explored interactions w/her daughter and effective communication and focus on positive interactions.  Discussed Adult nurse for information/resources.   Pt affect wnl.  Pt reports she had her colonoscopy and awaiting results.  Pt reports prep was easier this time.  Pt reports felt good to have daughter present and help that day and looking forward to her nail date w/ her tonight.  Pt reports she does feel on edge w/ her daughter and not wanting to ask things of her. Pt discussed struggles to get things done around the house between her and husband's health.  Pt discussed  communication and ways to be assertive.  Pt receptive to resource shared and will f/u /w her PCP.      Interventions: Cognitive Behavioral Therapy, Assertiveness/Communication, and supportive  Diagnosis:Major depressive disorder, recurrent episode, moderate (HCC)  Social anxiety disorder  Grief  Plan: Pt to f/u in 2 week w/ counseling.  Pt to f/u as scheduled w/ PCP and specialist as scheduled.     ndividualized Treatment Plan Strengths: seeking counseling.  Pt enjoys crocheting.   Supports: husband and daughter   Goal/Needs for Treatment:  In order of importance to patient 1) coping w/ depression and anxiety 2) work on grief related to losses.   3) --   Client Statement of Needs: I need to work though stressors and losses- My daughter's stuff, my mom's stuff, my grief w/ my mom, Work on my stuff with him (husband) and losing him.   Treatment Level:outpatient counseling  Symptoms:depression, anxiety, worry, fatigue, grief, sleep disturbance  Client Treatment Preferences:Weekly to biweekly counseling.   Healthcare consumer's goal for treatment:  Counselor, Damien Herald, [facility] will support the patient's ability to achieve the goals identified. Cognitive Behavioral Therapy, Assertive Communication/Conflict Resolution Training, Relaxation Training, ACT, Humanistic and other evidenced-based practices will be used to promote progress towards healthy functioning.   Healthcare consumer will: Actively participate in therapy, working towards healthy functioning.    *Justification for Continuation/Discontinuation of Goal: R=Revised, O=Ongoing, A=Achieved, D=Discontinued  Goal 1) Manage depression and anxiety, reframing related thoughts, self care and behavioral management AEB pt report of decreased symptoms and therapist  observation.  Baseline date 03/16/24: Progress towards goal 0; How Often - Daily Target Date Goal Was reviewed Status Code Progress towards goal/Likert rating  03/16/25                 Goal 2) Increase effective coping w/ losses and grief through effective expression of feelings AEB pt verbalizing in session and therapist observation.  Baseline date 03/16/24: Progress towards goal 0/; How Often - Daily Target Date Goal Was reviewed Status Code Progress towards goal  03/16/25                 This plan has been reviewed and created by the following participants:  This plan will be reviewed at least every 12 months. Date Behavioral Health Clinician Date Guardian/Patient   03/16/24 [name] 03/16/24 Verbal Consent Provided and signature on file              [name], [facility]

## 2024-07-26 ENCOUNTER — Ambulatory Visit: Payer: Self-pay | Admitting: Gastroenterology

## 2024-07-26 DIAGNOSIS — E66813 Obesity, class 3: Secondary | ICD-10-CM | POA: Diagnosis not present

## 2024-07-26 DIAGNOSIS — R197 Diarrhea, unspecified: Secondary | ICD-10-CM

## 2024-07-26 DIAGNOSIS — Z6841 Body Mass Index (BMI) 40.0 and over, adult: Secondary | ICD-10-CM | POA: Diagnosis not present

## 2024-07-27 MED ORDER — COLESTIPOL HCL 1 G PO TABS: 1.0000 g | ORAL_TABLET | Freq: Two times a day (BID) | ORAL | 2 refills | Status: DC

## 2024-07-31 ENCOUNTER — Other Ambulatory Visit

## 2024-08-01 DIAGNOSIS — Z7689 Persons encountering health services in other specified circumstances: Secondary | ICD-10-CM | POA: Diagnosis not present

## 2024-08-02 ENCOUNTER — Other Ambulatory Visit (INDEPENDENT_AMBULATORY_CARE_PROVIDER_SITE_OTHER)

## 2024-08-02 DIAGNOSIS — R197 Diarrhea, unspecified: Secondary | ICD-10-CM | POA: Diagnosis not present

## 2024-08-08 ENCOUNTER — Ambulatory Visit (INDEPENDENT_AMBULATORY_CARE_PROVIDER_SITE_OTHER): Admitting: Psychology

## 2024-08-08 DIAGNOSIS — F4321 Adjustment disorder with depressed mood: Secondary | ICD-10-CM | POA: Diagnosis not present

## 2024-08-08 DIAGNOSIS — F4010 Social phobia, unspecified: Secondary | ICD-10-CM | POA: Diagnosis not present

## 2024-08-08 DIAGNOSIS — F331 Major depressive disorder, recurrent, moderate: Secondary | ICD-10-CM | POA: Diagnosis not present

## 2024-08-08 NOTE — Progress Notes (Signed)
 [facility] Counselor/Therapist Progress Note  Patient ID: Angela Mahoney, MRN: [id number],    Date: 08/08/2024  Time Spent: 9:01am-9:55am   Pt is seen for a virtual video visit via caregility.    Pt joins from her home, reporting privacy, and counselor from her home office.  Pt consents of virtual visits and is aware of limitation so such visits.  Treatment Type: Individual Therapy  Reported Symptoms: pt reports continued stress/worry w/ health and husband's health  Mental Status Exam: Appearance:  Well Groomed     Behavior: Appropriate  Motor: Normal  Speech/Language:  Clear and Coherent  Affect: Appropriate  Mood: anxious  Thought process: normal  Thought content:   WNL  Sensory/Perceptual disturbances:   WNL  Orientation: oriented to person, place, time/date, and situation  Attention: Good  Concentration: Good  Memory: WNL  Fund of knowledge:  Good  Insight:   Good  Judgment:  Good  Impulse Control: Good   Risk Assessment: Danger to Self:  No Self-injurious Behavior: No Danger to Others: No Duty to Warn:no Physical Aggression / Violence:No  Access to Firearms a concern: No  Gang Involvement:No   Subjective: Counselor assessed pt current functioning per pt report.  Processed w/ pt continued stressors with her and husband's health.  Reflected positives of focus on what's in her control and w/ advocating.   Encouraged effective communication skills w/ daughter.   Pt affect wnl.  Pt reports results from colonoscopy were negative.  Pt reports requesting another stool sample to see if numbers trending in right directions and taking new med.  Pt reports more change w/ her husband's caregiver receives through [place] benefits.  Pt reports hoping for some consistency w/ the person who just started.  Pt reports some minor conflict w/ daughter but handling ok.  Pt continuing to advocate for self and husband.    Interventions: Cognitive Behavioral Therapy,  Assertiveness/Communication, and supportive  Diagnosis:Major depressive disorder, recurrent episode, moderate (HCC)  Social anxiety disorder  Grief  Plan: Pt to f/u in 2 week w/ counseling.  Pt to f/u as scheduled w/ PCP and specialist as scheduled.     ndividualized Treatment Plan Strengths: seeking counseling.  Pt enjoys crocheting.   Supports: husband and daughter   Goal/Needs for Treatment:  In order of importance to patient 1) coping w/ depression and anxiety 2) work on grief related to losses.   3) --   Client Statement of Needs: I need to work though stressors and losses- My daughter's stuff, my mom's stuff, my grief w/ my mom, Work on my stuff with him (husband) and losing him.   Treatment Level:outpatient counseling  Symptoms:depression, anxiety, worry, fatigue, grief, sleep disturbance  Client Treatment Preferences:Weekly to biweekly counseling.   Healthcare consumer's goal for treatment:  Counselor, Damien Herald, [facility] will support the patient's ability to achieve the goals identified. Cognitive Behavioral Therapy, Assertive Communication/Conflict Resolution Training, Relaxation Training, ACT, Humanistic and other evidenced-based practices will be used to promote progress towards healthy functioning.   Healthcare consumer will: Actively participate in therapy, working towards healthy functioning.    *Justification for Continuation/Discontinuation of Goal: R=Revised, O=Ongoing, A=Achieved, D=Discontinued  Goal 1) Manage depression and anxiety, reframing related thoughts, self care and behavioral management AEB pt report of decreased symptoms and therapist observation.  Baseline date 03/16/24: Progress towards goal 0; How Often - Daily Target Date Goal Was reviewed Status Code Progress towards goal/Likert rating  03/16/25  Goal 2) Increase effective coping w/ losses and grief through effective expression of feelings AEB pt verbalizing in session and  therapist observation.  Baseline date 03/16/24: Progress towards goal 0/; How Often - Daily Target Date Goal Was reviewed Status Code Progress towards goal  03/16/25                 This plan has been reviewed and created by the following participants:  This plan will be reviewed at least every 12 months. Date Behavioral Health Clinician Date Guardian/Patient   03/16/24 [facility] 03/16/24 Verbal Consent Provided and signature on file            [place], [facility]

## 2024-08-09 ENCOUNTER — Other Ambulatory Visit: Payer: Self-pay | Admitting: Family Medicine

## 2024-08-09 DIAGNOSIS — K219 Gastro-esophageal reflux disease without esophagitis: Secondary | ICD-10-CM

## 2024-08-09 LAB — CALPROTECTIN: Calprotectin: 276 ug/g — ABNORMAL HIGH

## 2024-08-10 ENCOUNTER — Ambulatory Visit: Payer: Self-pay | Admitting: Gastroenterology

## 2024-08-10 DIAGNOSIS — R197 Diarrhea, unspecified: Secondary | ICD-10-CM

## 2024-08-10 DIAGNOSIS — R195 Other fecal abnormalities: Secondary | ICD-10-CM

## 2024-08-17 ENCOUNTER — Ambulatory Visit (HOSPITAL_COMMUNITY)
Admission: RE | Admit: 2024-08-17 | Discharge: 2024-08-17 | Disposition: A | Discharge status: Home / Self Care | Source: Ambulatory Visit | Attending: Gastroenterology | Admitting: Gastroenterology

## 2024-08-17 DIAGNOSIS — R195 Other fecal abnormalities: Secondary | ICD-10-CM | POA: Insufficient documentation

## 2024-08-17 DIAGNOSIS — R197 Diarrhea, unspecified: Secondary | ICD-10-CM | POA: Diagnosis not present

## 2024-08-17 DIAGNOSIS — I700 Atherosclerosis of aorta: Secondary | ICD-10-CM | POA: Diagnosis not present

## 2024-08-17 DIAGNOSIS — R1903 Right lower quadrant abdominal swelling, mass and lump: Secondary | ICD-10-CM | POA: Diagnosis not present

## 2024-08-17 DIAGNOSIS — Z903 Acquired absence of stomach [part of]: Secondary | ICD-10-CM | POA: Diagnosis not present

## 2024-08-17 MED ORDER — IOHEXOL 300 MG/ML  SOLN
125.0000 mL | Freq: Once | INTRAMUSCULAR | Status: AC | PRN
  Administered 2024-08-17: 125 mL via INTRAVENOUS

## 2024-08-17 NOTE — Progress Notes (Unsigned)
 Angela Console, PA-C [address] [place], [place]  [address] Phone: [phone]   Primary Care Physician: Lendia Boby CROME, NP-C  Primary Gastroenterologist:  Angela Console, PA-C / Elspeth Naval, MD   Chief Complaint: Follow-up diarrhea; Non-specific Colitis     HPI:   Angela Mahoney is a 63 y.o. female returns for 62-month follow-up of diarrhea which started June 2025.  She tried Imodium with no benefit.  1 month ago was started on Colestid  1 g twice daily, which has helped a little.  She is still having watery stools 3 times a day.  Diarrhea occurs every other day.  Someday she has no bowel movement.  She denies rectal bleeding, nocturnal diarrhea or weight loss.  She has diffuse lower abdominal pain which she is standing.  Does not have any abdominal pain when she is sitting or laying down.  07/20/24 colonoscopy by Dr. Naval (for diarrhea and elevated fecal calprotectin): Fair prep.  Moderate lavaged with mostly adequate views.  1 small 5 mm tubular adenoma polyp removed.  Internal hemorrhoids.  Torturous colon.  Biopsies showed acute nonspecific colitis.  Biopsies showed no evidence of microscopic colitis.  No evidence of IBD.  5-year repeat colonoscopy (due 07/2029).  08/17/2024 abdominal pelvic CT enterography with contrast: 1. No acute inflammatory process identified within the abdomen or pelvis. No bowel obstruction. No suspicious lesion identified. 2. There is paucity of haustral markings mainly in the left hemicolon, which is nonspecific. There is no classic lead pipe appearance. Consider further evaluation with colonoscopy, as clinically indicated. The terminal ileum appears within normal limits. 3. There is a well-circumscribed approximately 1.7 x 1.8 cm hypoattenuating structure in the right adnexa, which is incompletely characterized on the current exam but favored ovarian etiology. This may represent a small senescent ovarian cyst. Consider  follow-up pelvic ultrasound in 1 year to document stability.  06/2024 abdominal x-ray: Normal.  No significant stool burden.  06/2024 fecal calprotectin elevated at 715.  Normal pancreatic elastase 266.  Celiac labs negative.  07/2024 repeat fecal calprotectin improved to 276.  05/17/2024 GI pathogen panel negative through PCP.  Patient states she had a negative screening Cologuard a few years ago in [facility].   02/2024 last labs: Normal CBC, CMP, TSH.  09/2017 screening colonoscopy in [place] : Excellent prep.  No polyps.  Small internal hemorrhoids.  Normal colon mucosa.  No biopsies.  Repeat in 10 years for screening (due 09/2027).   Takes pantoprazole  40 Mg and famotidine  10 Mg daily for GERD.  PMH: CAD, Hx MI, COPD, sleep apnea, GERD, hypothyroidism, fibromyalgia, depression, rheumatoid arthritis, chronic fatigue syndrome.  Prior appendectomy and bariatric surgery.  Had gastric sleeve surgery in 2017 in [place] .  Still has gallbladder.   Current Outpatient Medications  Medication Sig Dispense Refill   albuterol  (VENTOLIN  HFA) 108 (90 Base) MCG/ACT inhaler Inhale 1-2 puffs into the lungs every 6 (six) hours as needed for shortness of breath or wheezing.     Ascorbic Acid (VITAMIN C-ROSE HIPS CR) 1500 MG TBCR Take 1,500 mg by mouth daily.     atorvastatin  (LIPITOR ) 80 MG tablet Take 1 tablet (80 mg total) by mouth daily with supper. 90 tablet 1   b complex vitamins capsule Take 1 capsule by mouth daily.     Baclofen  5 MG TABS TAKE ONE TABLET BY MOUTH DAILY AT BEDTIME 90 tablet 0   buPROPion  (WELLBUTRIN  XL) 150 MG 24 hr tablet Take 1 tablet (150 mg total) by  mouth every morning. 90 tablet 1   busPIRone  (BUSPAR ) 15 MG tablet TAKE ONE TABLET BY MOUTH THREE TIMES DAILY 270 tablet 0   Calcium  Citrate-Vitamin D  (CALCIUM  CITRATE + D PO) Take 1 tablet by mouth daily.     colestipol  (COLESTID ) 1 g tablet Take 1 tablet (1 g total) by mouth 2 (two) times daily. Take 2-4 hours apart from all other  medications 60 tablet 2   DULoxetine  (CYMBALTA ) 60 MG capsule Take 2 capsules (120 mg total) by mouth at bedtime. 60 capsule 2   famotidine  (PEPCID ) 20 MG tablet famotidine  20 mg tablet    Status: Active  Code System: RxNorm     fludrocortisone  (FLORINEF ) 0.1 MG tablet Take 0.2 mg by mouth daily.     fluticasone  (FLONASE ) 50 MCG/ACT nasal spray Place 1 spray into both nostrils daily as needed for allergies or rhinitis.     gabapentin  (NEURONTIN ) 300 MG capsule Take 3 capsules by mouth in the morning, 2 capsules in the afternoon and 2 capsules at night 630 capsule 0   loratadine (CLARITIN) 10 MG tablet Take by mouth.     Multiple Vitamin (MULTIVITAMIN WITH MINERALS) TABS tablet Take 1 tablet by mouth daily.     ondansetron  (ZOFRAN -ODT) 4 MG disintegrating tablet Take 1 tablet by mouth every 8 (eight) hours as needed for nausea/vomiting.     oxybutynin  (DITROPAN ) 5 MG tablet TAKE 1 TABLET TWICE A DAY 180 tablet 0   pantoprazole  (PROTONIX ) 40 MG tablet Take 1 tablet (40 mg total) by mouth daily. 90 tablet 0   topiramate (TOPAMAX) 50 MG tablet TAKE ONE TABLET BY MOUTH ONE TIME DAILY 90 tablet 0   No current facility-administered medications for this visit.    Allergies as of 08/18/2024 - Review Complete 08/18/2024  Allergen Reaction Noted   Zolpidem Swelling 04/04/2020    Past Medical History:  Diagnosis Date   Anemia    Anxiety    Arthritis    Back pain    Bilateral swelling of feet    Chest pain    Chronic fatigue syndrome    COPD (chronic obstructive pulmonary disease) (HCC) 2017   Coronary artery disease    Ozell Kubas, PA   Depression    Dyspnea    with exertion   Falls    Fibromyalgia    GERD (gastroesophageal reflux disease)    Headache    High cholesterol    History of appendectomy 10/26/2022   History of fainting spells of unknown cause    ? 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   Prediabetes 10/06/2023   Rheumatoid arthritis (HCC)    Seasonal allergies    Sleep apnea    does not use cpap   SOB (shortness of breath)    Vitamin D  deficiency     Past Surgical History:  Procedure Laterality Date   BUNIONECTOMY Right 2002   CARDIAC CATHETERIZATION  01/23/2016   In [place]   COLONOSCOPY     x 2 at ages 55yr and 12yr - [place]   EXTERNAL FIXATION REMOVAL Right 08/06/2021   Procedure: REMOVAL EXTERNAL FIXATION LEG;  Surgeon: Kendal Franky SQUIBB, MD;  Location: [facility] OR;  Service: Orthopedics;  Laterality: Right;  zimmer biomet ex-fix removal   LAPAROSCOPIC GASTRIC SLEEVE RESECTION  2017   [place]   MULTIPLE TOOTH EXTRACTIONS     has upper and lower dentures   ORIF ANKLE FRACTURE Right 08/06/2021   Procedure: OPEN REDUCTION INTERNAL FIXATION (ORIF) ANKLE FRACTURE;  Surgeon: Kendal Franky SQUIBB, MD;  Location: [facility] OR;  Service: Orthopedics;  Laterality: Right;  smith and nephew   SYNDESMOSIS REPAIR Right 12/15/2021   Procedure: SYNDESMOSIS REPAIR;  Surgeon: Kendal Franky SQUIBB, MD;  Location: [facility] OR;  Service: Orthopedics;  Laterality: Right;    Review of Systems:    All systems reviewed and negative except where noted in HPI.    Physical Exam:  BP 100/60   Pulse 93   Ht 5' 4 (1.626 m)   Wt 244 lb (110.7 kg)   BMI 41.88 kg/m  No LMP recorded. Patient is postmenopausal.  General: Well-nourished, well-developed, obese, in no acute distress.  Walks with a walker. Neuro: Alert and oriented x 3.  Grossly intact.  Psych: Alert and cooperative, normal mood and affect.   Imaging Studies: CT ENTERO ABD/PELVIS W CONTAST Result Date: 08/17/2024 CLINICAL DATA:  diarrhea, elevated fecal calprotectin; r/o crohns EXAM: CT ABDOMEN AND PELVIS WITH CONTRAST (ENTEROGRAPHY) TECHNIQUE: Multidetector CT of the abdomen and pelvis during bolus administration of intravenous  contrast. Negative oral contrast was given. RADIATION DOSE REDUCTION: This exam was performed according to the departmental dose-optimization program which includes automated exposure control, adjustment of the mA and/or kV according to patient size and/or use of iterative reconstruction technique. CONTRAST:  125mL OMNIPAQUE IOHEXOL 300 MG/ML  SOLN COMPARISON:  None Available. FINDINGS: Lower chest: There are subpleural atelectatic changes in the visualized lung bases. No overt consolidation. No pleural effusion. The heart is normal in size. No pericardial effusion. Hepatobiliary: The liver is normal in size. Non-cirrhotic configuration. These is diffuse hepatic steatosis. No suspicious mass. Note is made of a subcentimeter well-circumscribed hypoattenuating focus in the right hepatic lobe, which is too small to adequately characterize. Variant portal venous anatomy noted. No intrahepatic or extrahepatic bile duct dilation. No calcified gallstones. Normal gallbladder wall thickness. No pericholecystic inflammatory changes. Pancreas: No suspicious lesion. There are several 5 mm or smaller foci of intrapancreatic lipoma in the pancreatic head. No pancreatic ductal dilatation or surrounding inflammatory changes. Spleen: Within normal limits. No focal lesion. Adrenals/Urinary Tract: There is an elongated 9 x 17 mm right adrenal nodule, indeterminate. Unremarkable left adrenal gland. No suspicious renal mass. No hydronephrosis. No renal or ureteric calculi. Unremarkable urinary bladder. Stomach/Bowel: There is fluid-filled and dilated visualized lower thoracic esophagus, which is nonspecific but most likely seen in the settings of chronic gastroesophageal reflux disease versus esophageal dysmotility. There are postsurgical changes from prior sleeve gastrectomy. There is small sliding hiatal hernia. No disproportionate dilation of the small or large bowel loops. No evidence of abnormal bowel wall thickening, mucosal  hyperenhancement or inflammatory changes. The appendix was not visualized; however there is no acute inflammatory process in the right lower quadrant. Note is made of moderate-to-large stool burden. The terminal ileum appears within normal limits. However, there is paucity of haustral markings mainly in the left hemicolon, which is nonspecific. There is no classic lead pipe appearance. Correlate clinically. Consider further evaluation with colonoscopy, as clinically indicated. Vascular/Lymphatic: No ascites or pneumoperitoneum. No abdominal or pelvic lymphadenopathy, by size criteria. No aneurysmal dilation of the major abdominal arteries. There are mild peripheral atherosclerotic vascular calcifications of the aorta and its major branches. Reproductive: The uterus is unremarkable. There is a well-circumscribed approximately 1.7 x 1.8 cm hypoattenuating  structure in the right adnexa, which is incompletely characterized on the current exam but favored ovarian etiology. This may represent a small senescent ovarian cyst. Consider follow-up pelvic ultrasound in 1 year to document stability. No other adnexal mass seen. Other: There is a tiny fat containing umbilical hernia. The soft tissues and abdominal wall are otherwise unremarkable. Musculoskeletal: No suspicious osseous lesions. There are mild - moderate multilevel degenerative changes in the visualized spine. IMPRESSION: 1. No acute inflammatory process identified within the abdomen or pelvis. No bowel obstruction. No suspicious lesion identified. 2. There is paucity of haustral markings mainly in the left hemicolon, which is nonspecific. There is no classic lead pipe appearance. Consider further evaluation with colonoscopy, as clinically indicated. The terminal ileum appears within normal limits. 3. There is a well-circumscribed approximately 1.7 x 1.8 cm hypoattenuating structure in the right adnexa, which is incompletely characterized on the current exam but  favored ovarian etiology. This may represent a small senescent ovarian cyst. Consider follow-up pelvic ultrasound in 1 year to document stability. 4. Multiple other nonacute observations, as described above. Aortic Atherosclerosis (ICD10-I70.0). Electronically Signed   By: Ree Molt M.D.   On: 08/17/2024 10:27    Labs: CBC    Component Value Date/Time   WBC 4.8 02/28/2024 1451   RBC 4.54 02/28/2024 1451   HGB 13.1 02/28/2024 1451   HGB 13.4 08/31/2023 0929   HCT 40.5 02/28/2024 1451   HCT 42.0 08/31/2023 0929   PLT 205.0 02/28/2024 1451   PLT 182 08/31/2023 0929   MCV 89.3 02/28/2024 1451   MCV 90 08/31/2023 0929   MCH 28.7 08/31/2023 0929   MCH 28.5 12/15/2021 0559   MCHC 32.4 02/28/2024 1451   RDW 14.9 02/28/2024 1451   RDW 13.3 08/31/2023 0929   LYMPHSABS 1.3 08/31/2023 0929   MONOABS 0.5 12/15/2021 0559   EOSABS 0.2 08/31/2023 0929   BASOSABS 0.1 08/31/2023 0929    CMP     Component Value Date/Time   NA 141 02/28/2024 1451   NA 144 08/31/2023 0929   K 3.8 02/28/2024 1451   CL 108 02/28/2024 1451   CO2 26 02/28/2024 1451   GLUCOSE 86 02/28/2024 1451   BUN 10 02/28/2024 1451   BUN 11 08/31/2023 0929   CREATININE 0.85 02/28/2024 1451   CALCIUM  9.1 02/28/2024 1451   PROT 6.7 02/28/2024 1451   PROT 6.6 08/31/2023 0929   ALBUMIN 4.2 02/28/2024 1451   ALBUMIN 4.3 08/31/2023 0929   AST 25 02/28/2024 1451   ALT 21 02/28/2024 1451   ALKPHOS 64 02/28/2024 1451   BILITOT 0.3 02/28/2024 1451   BILITOT 0.3 08/31/2023 0929   GFRNONAA >60 12/15/2021 0559   GFRAA >60 04/03/2020 2209     Assessment and Plan:   Angela Mahoney is a 63 y.o. y/o female returns for follow-up of diarrhea.  Recent colonoscopy showed nonspecific colitis.  Fecal calprotectin was elevated.  Biopsies were negative for microscopic colitis or IBD.  GI pathogen panel was negative for infections.  Celiac labs negative.  Fecal pancreatic elastase was normal.   Abdominal pelvic CT enterography  unrevealing.  1.  Nonspecific colitis with diarrhea for 4 months. - I am giving trial of budesonide 3 mg take 3 capsules daily for 2 weeks, then 2 capsule daily for 2 weeks, then 1 capsule daily for 4 weeks. - Continue Colestid  1 g twice daily. - Pending response to treatment, consider increasing Colestid  to 2 tablets twice daily. - Continue drinking 64-hour ounces of  liquids daily.  Angela Console, PA-C  Follow up in 6 - 8 weeks for diarrhea and colitis.

## 2024-08-18 ENCOUNTER — Encounter: Payer: Self-pay | Admitting: Physician Assistant

## 2024-08-18 ENCOUNTER — Telehealth: Payer: Self-pay | Admitting: Physician Assistant

## 2024-08-18 ENCOUNTER — Other Ambulatory Visit (HOSPITAL_COMMUNITY): Payer: Self-pay

## 2024-08-18 ENCOUNTER — Ambulatory Visit (INDEPENDENT_AMBULATORY_CARE_PROVIDER_SITE_OTHER): Admitting: Physician Assistant

## 2024-08-18 ENCOUNTER — Telehealth: Payer: Self-pay

## 2024-08-18 VITALS — BP 100/60 | HR 93 | Ht 64.0 in | Wt 244.0 lb

## 2024-08-18 DIAGNOSIS — R195 Other fecal abnormalities: Secondary | ICD-10-CM

## 2024-08-18 DIAGNOSIS — Z860101 Personal history of adenomatous and serrated colon polyps: Secondary | ICD-10-CM | POA: Diagnosis not present

## 2024-08-18 DIAGNOSIS — K529 Noninfective gastroenteritis and colitis, unspecified: Secondary | ICD-10-CM | POA: Diagnosis not present

## 2024-08-18 DIAGNOSIS — R1310 Dysphagia, unspecified: Secondary | ICD-10-CM

## 2024-08-18 MED ORDER — BUDESONIDE 3 MG PO CPEP: 3.0000 mg | ORAL_CAPSULE | Freq: Every day | ORAL | 1 refills | Status: DC

## 2024-08-18 NOTE — Telephone Encounter (Signed)
 Inbound call from patient stating she was advised by the pharmacy that Budesonide needs prior authorization. States Ellouise advised that she should start medication today. Patient is requesting a call back. Please advise, thank you

## 2024-08-18 NOTE — Telephone Encounter (Signed)
 Pharmacy Patient Advocate Encounter  Received notification from [facility] [employer] that Prior Authorization for Budesonide 3MG  dr capsules has been APPROVED from 08-18-2024 to 11-16-2024. Ran test claim, Copay is $11.91 for 90 day supply. This test claim was processed through [facility]- copay amounts may vary at other pharmacies due to pharmacy/plan contracts, or as the patient moves through the different stages of their insurance plan.   PA #/Case ID/Reference #: [id number]

## 2024-08-18 NOTE — Patient Instructions (Addendum)
 For Colitis / Diarrhea: Start prescription budesonide 3 mg, take 3 capsules once daily in the morning for 2 weeks, then take 2 capsules once daily for 2 weeks, then take 1 capsule once a daily for 4 weeks.  Call me with an update on your diarrhea symptoms in 2 to 4 weeks.  Continue Colestid  1 g 1 tablet twice daily.  Please let me know if you get constipated.  I hope you feel better soon.  Ellouise Console, PA-C   Please follow up sooner if symptoms increase or worsen  Due to recent changes in healthcare laws, you may see the results of your imaging and laboratory studies on MyChart before your provider has had a chance to review them.  We understand that in some cases there may be results that are confusing or concerning to you. Not all laboratory results come back in the same time frame and the provider may be waiting for multiple results in order to interpret others.  Please give us  48 hours in order for your provider to thoroughly review all the results before contacting the office for clarification of your results.   Thank you for trusting me with your gastrointestinal care!   Ellouise Console, PA-C _______________________________________________________  If your blood pressure at your visit was 140/90 or greater, please contact your primary care physician to follow up on this.  _______________________________________________________  If you are age 59 or older, your body mass index should be between 23-30. Your Body mass index is 41.88 kg/m. If this is out of the aforementioned range listed, please consider follow up with your Primary Care Provider.  If you are age 27 or younger, your body mass index should be between 19-25. Your Body mass index is 41.88 kg/m. If this is out of the aformentioned range listed, please consider follow up with your Primary Care Provider.   ________________________________________________________  The [facility] providers would like to encourage you to use  MYCHART to communicate with providers for non-urgent requests or questions.  Due to long hold times on the telephone, sending your provider a message by [facility] may be a faster and more efficient way to get a response.  Please allow 48 business hours for a response.  Please remember that this is for non-urgent requests.  _______________________________________________________

## 2024-08-18 NOTE — Telephone Encounter (Signed)
 Inbound call pt requesting to have only a 30 supply due to her not being able to afford her copay. Patient is requesting a call back in regard to her budesonide. Please advise.

## 2024-08-18 NOTE — Telephone Encounter (Signed)
 Pharmacy Patient Advocate Encounter   Received notification from Pt Calls Messages that prior authorization for Budesonide 3MG  dr capsules is required/requested.   Insurance verification completed.   The patient is insured through [employer] Medicare.   Per test claim: PA required; PA submitted to above mentioned insurance via Latent Key/confirmation #/EOC [id number] Status is pending

## 2024-08-19 ENCOUNTER — Other Ambulatory Visit: Payer: Self-pay | Admitting: Family Medicine

## 2024-08-19 DIAGNOSIS — E7849 Other hyperlipidemia: Secondary | ICD-10-CM

## 2024-08-20 ENCOUNTER — Ambulatory Visit: Payer: Self-pay | Admitting: Gastroenterology

## 2024-08-20 NOTE — Progress Notes (Signed)
 Agree with assessment as outlined.  Can try empiric budesonide and see how she does - will need to keep a close eye on her and follow up in the next 6-8 weeks for reassessment.   Otherwise she needs to be aware of ovarian cyst noted on enterography study - should follow up with her PCP or GYN about this, defer to them for follow up for that particular issue.

## 2024-08-21 ENCOUNTER — Other Ambulatory Visit: Payer: Self-pay | Admitting: Family Medicine

## 2024-08-21 ENCOUNTER — Telehealth: Payer: Self-pay | Admitting: Physician Assistant

## 2024-08-21 MED ORDER — BUDESONIDE 3 MG PO CPEP: ORAL_CAPSULE | ORAL | 1 refills | Status: DC

## 2024-08-21 NOTE — Addendum Note (Signed)
 Addended by: LANETTE ALETHEA CROME on: 08/21/2024 01:59 PM   Modules accepted: Orders

## 2024-08-21 NOTE — Telephone Encounter (Signed)
 I called the patient and informed her that I have sent her the prescription of Budesonide 3 mg and explained to her that she will take 3 tablets daily for 2 weeks, 2 tablets for 2 weeks and 1 tablet for 3 weeks. Patient verbalizes understanding of only having 98 pills she will call back with any questions comments or concerns.

## 2024-08-21 NOTE — Telephone Encounter (Signed)
 Inbound call from patient stating she would like to speak to someone in regards to medication budesonide, patient stating pharmacy instructions are wrong and the prescription is needing to be sent with instructions Ellouise gave. Patient stated she was advised to take 3 capsules a day for 2 weeks then 2 capsules a day for 2 weeks and then 1 capsule for 4 weeks. Patient is stating amount on prescription bottle is not enough

## 2024-08-22 ENCOUNTER — Other Ambulatory Visit: Payer: Self-pay | Admitting: Family Medicine

## 2024-08-22 ENCOUNTER — Ambulatory Visit: Admitting: Psychology

## 2024-08-22 DIAGNOSIS — F4010 Social phobia, unspecified: Secondary | ICD-10-CM

## 2024-08-22 DIAGNOSIS — F331 Major depressive disorder, recurrent, moderate: Secondary | ICD-10-CM | POA: Diagnosis not present

## 2024-08-22 NOTE — Progress Notes (Signed)
  Behavioral Health Counselor/Therapist Progress Note  Patient ID: Angela Mahoney, MRN: [id number],    Date: 08/22/2024  Time Spent: 9:05am-9:56am   Pt is seen for a virtual video visit via caregility.    Pt joins from her home, reporting privacy, and counselor from her home office.  Pt consents of virtual visits and is aware of limitation so such visits.  Treatment Type: Individual Therapy  Reported Symptoms: pt reports feeling tired and depressed w/ her medical issues  Mental Status Exam: Appearance:  Well Groomed     Behavior: Appropriate  Motor: Normal  Speech/Language:  Clear and Coherent  Affect: Appropriate  Mood: depressed  Thought process: normal  Thought content:   WNL  Sensory/Perceptual disturbances:   WNL  Orientation: oriented to person, place, time/date, and situation  Attention: Good  Concentration: Good  Memory: WNL  Fund of knowledge:  Good  Insight:   Good  Judgment:  Good  Impulse Control: Good   Risk Assessment: Danger to Self:  No Self-injurious Behavior: No Danger to Others: No Duty to Warn:no Physical Aggression / Violence:No  Access to Firearms a concern: No  Gang Involvement:No   Subjective: Counselor assessed pt current functioning per pt report.  Processed w/ pt continued medical stressors with her health.  Validated and normalized feelings.  Discussed her supports and connection w/ community for coping.  Pt affect wnl.  Pt reports she is tired, fatigued and feeling depressed.  Pt reports hard w/ lack of answers to her symptoms and lack of improvement.  Pt reflected on things that are encouraging and supportive in past.  Pt wants to connect w/ faith community and discussed calling nearby church to get engaged.    Interventions: Cognitive Behavioral Therapy, Assertiveness/Communication, and supportive  Diagnosis:Major depressive disorder, recurrent episode, moderate (HCC)  Social anxiety disorder  Plan: Pt to f/u in 2 week  w/ counseling.  Pt to f/u as scheduled w/ PCP and specialist as scheduled.     ndividualized Treatment Plan Strengths: seeking counseling.  Pt enjoys crocheting.   Supports: husband and daughter   Goal/Needs for Treatment:  In order of importance to patient 1) coping w/ depression and anxiety 2) work on grief related to losses.   3) --   Client Statement of Needs: I need to work though stressors and losses- My daughter's stuff, my mom's stuff, my grief w/ my mom, Work on my stuff with him (husband) and losing him.   Treatment Level:outpatient counseling  Symptoms:depression, anxiety, worry, fatigue, grief, sleep disturbance  Client Treatment Preferences:Weekly to biweekly counseling.   Healthcare consumer's goal for treatment:  Counselor, Damien Herald, [facility] will support the patient's ability to achieve the goals identified. Cognitive Behavioral Therapy, Assertive Communication/Conflict Resolution Training, Relaxation Training, ACT, Humanistic and other evidenced-based practices will be used to promote progress towards healthy functioning.   Healthcare consumer will: Actively participate in therapy, working towards healthy functioning.    *Justification for Continuation/Discontinuation of Goal: R=Revised, O=Ongoing, A=Achieved, D=Discontinued  Goal 1) Manage depression and anxiety, reframing related thoughts, self care and behavioral management AEB pt report of decreased symptoms and therapist observation.  Baseline date 03/16/24: Progress towards goal 0; How Often - Daily Target Date Goal Was reviewed Status Code Progress towards goal/Likert rating  03/16/25                Goal 2) Increase effective coping w/ losses and grief through effective expression of feelings AEB pt verbalizing in session and therapist  observation.  Baseline date 03/16/24: Progress towards goal 0/; How Often - Daily Target Date Goal Was reviewed Status Code Progress towards goal  03/16/25                  This plan has been reviewed and created by the following participants:  This plan will be reviewed at least every 12 months. Date Behavioral Health Clinician Date Guardian/Patient   03/16/24 [facility] 03/16/24 Verbal Consent Provided and signature on file             [place], [facility]

## 2024-08-31 DIAGNOSIS — R630 Anorexia: Secondary | ICD-10-CM | POA: Diagnosis not present

## 2024-09-01 ENCOUNTER — Ambulatory Visit (INDEPENDENT_AMBULATORY_CARE_PROVIDER_SITE_OTHER)

## 2024-09-01 ENCOUNTER — Ambulatory Visit: Admitting: Family Medicine

## 2024-09-01 ENCOUNTER — Ambulatory Visit: Payer: Self-pay | Admitting: Family Medicine

## 2024-09-01 VITALS — BP 112/82 | HR 108 | Temp 97.6°F | Ht 64.0 in | Wt 247.0 lb

## 2024-09-01 DIAGNOSIS — R195 Other fecal abnormalities: Secondary | ICD-10-CM | POA: Diagnosis not present

## 2024-09-01 DIAGNOSIS — R350 Frequency of micturition: Secondary | ICD-10-CM

## 2024-09-01 DIAGNOSIS — M546 Pain in thoracic spine: Secondary | ICD-10-CM

## 2024-09-01 DIAGNOSIS — M545 Low back pain, unspecified: Secondary | ICD-10-CM

## 2024-09-01 DIAGNOSIS — R300 Dysuria: Secondary | ICD-10-CM

## 2024-09-01 DIAGNOSIS — E039 Hypothyroidism, unspecified: Secondary | ICD-10-CM | POA: Diagnosis not present

## 2024-09-01 DIAGNOSIS — R110 Nausea: Secondary | ICD-10-CM | POA: Diagnosis not present

## 2024-09-01 DIAGNOSIS — R7303 Prediabetes: Secondary | ICD-10-CM

## 2024-09-01 DIAGNOSIS — R6883 Chills (without fever): Secondary | ICD-10-CM

## 2024-09-01 DIAGNOSIS — E7800 Pure hypercholesterolemia, unspecified: Secondary | ICD-10-CM | POA: Diagnosis not present

## 2024-09-01 LAB — COMPREHENSIVE METABOLIC PANEL WITH GFR
ALT: 23 U/L (ref 0–35)
AST: 25 U/L (ref 0–37)
Albumin: 4.5 g/dL (ref 3.5–5.2)
Alkaline Phosphatase: 68 U/L (ref 39–117)
BUN: 13 mg/dL (ref 6–23)
CO2: 27 meq/L (ref 19–32)
Calcium: 9.4 mg/dL (ref 8.4–10.5)
Chloride: 103 meq/L (ref 96–112)
Creatinine, Ser: 1.02 mg/dL (ref 0.40–1.20)
GFR: 57.68 mL/min — ABNORMAL LOW (ref >60.00)
Glucose, Bld: 117 mg/dL — ABNORMAL HIGH (ref 70–99)
Potassium: 4.2 meq/L (ref 3.5–5.1)
Sodium: 138 meq/L (ref 135–145)
Total Bilirubin: 0.7 mg/dL (ref 0.2–1.2)
Total Protein: 7.4 g/dL (ref 6.0–8.3)

## 2024-09-01 LAB — CBC WITH DIFFERENTIAL/PLATELET
Basophils Absolute: 0 K/uL (ref 0.0–0.1)
Basophils Relative: 0.4 % (ref 0.0–3.0)
Eosinophils Absolute: 0 K/uL (ref 0.0–0.7)
Eosinophils Relative: 0.3 % (ref 0.0–5.0)
HCT: 42.1 % (ref 36.0–46.0)
Hemoglobin: 13.4 g/dL (ref 12.0–15.0)
Lymphocytes Relative: 8.7 % — ABNORMAL LOW (ref 12.0–46.0)
Lymphs Abs: 0.9 K/uL (ref 0.7–4.0)
MCHC: 31.7 g/dL (ref 30.0–36.0)
MCV: 90.8 fl (ref 78.0–100.0)
Monocytes Absolute: 0.9 K/uL (ref 0.1–1.0)
Monocytes Relative: 8.7 % (ref 3.0–12.0)
Neutro Abs: 8.6 K/uL — ABNORMAL HIGH (ref 1.4–7.7)
Neutrophils Relative %: 81.9 % — ABNORMAL HIGH (ref 43.0–77.0)
Platelets: 200 K/uL (ref 150.0–400.0)
RBC: 4.63 Mil/uL (ref 3.87–5.11)
RDW: 16.1 % — ABNORMAL HIGH (ref 11.5–15.5)
WBC: 10.5 K/uL (ref 4.0–10.5)

## 2024-09-01 LAB — TSH: TSH: 2.29 u[IU]/mL (ref 0.35–5.50)

## 2024-09-01 LAB — LIPID PANEL
Cholesterol: 148 mg/dL (ref 0–200)
HDL: 78 mg/dL (ref >39.00)
LDL Cholesterol: 49 mg/dL (ref 0–99)
NonHDL: 69.63
Total CHOL/HDL Ratio: 2
Triglycerides: 105 mg/dL (ref 0.0–149.0)
VLDL: 21 mg/dL (ref 0.0–40.0)

## 2024-09-01 LAB — HEMOGLOBIN A1C: Hgb A1c MFr Bld: 5.3 % (ref 4.6–6.5)

## 2024-09-01 NOTE — Progress Notes (Signed)
 Subjective:     Patient ID: Angela Mahoney, female    DOB: 1961-04-28, 63 y.o.   MRN: [id number]  Chief Complaint  Patient presents with   Medical Management of Chronic Issues    3 month f/u    HPI  Discussed the use of AI scribe software for clinical note transcription with the patient, who gave verbal consent to proceed.  History of Present Illness Angela Mahoney is a 63 year old female who presents with severe back pain.  Severe back pain - Onset the evening prior to presentation - Originates in the lower back and radiates upwards to the rib area - Pain intensity is 10 out of 10 and persistent - No relief with heating pad - No recent injury or fall - Accompanied by nausea and chills - No vomiting  Gastrointestinal symptoms - Ongoing loose stools - Dehydration present, not drinking enough water - Ongoing abdominal pain - Uses ondansetron  for nausea related to previous stomach surgery   Urinary symptoms - Urinary frequency and pain suggestive of urinary tract infection - Daily urinary incontinence, wears pads - Unable to attend urology appointment  Other providers: Cardiologist- [facility]. [facility]- Dr. Berkeley Velton Keen pulmonology in the past.  Psychiatrist- needs a new one that takes  Counselor- can no longer see them due to new insurance  OB/GYN- [facility] (mammogram and DEXA) Dr. Latisha    Health Maintenance Due  Topic Date Due   HIV Screening  Never done   Hepatitis C Screening  Never done   DTaP/Tdap/Td (1 - Tdap) Never done   Zoster Vaccines- Shingrix (1 of 2) Never done   Lung Cancer Screening  Never done   Influenza Vaccine  06/16/2024   Medicare Annual Wellness (AWV)  09/21/2024    Past Medical History:  Diagnosis Date   Anemia    Anxiety    Arthritis    Back pain    Bilateral swelling of feet    Chest pain    Chronic fatigue syndrome    COPD (chronic obstructive pulmonary disease) (HCC) 2017    Coronary artery disease    Ozell Kubas, PA   Depression    Dyspnea    with exertion   Falls    Fibromyalgia    GERD (gastroesophageal reflux disease)    Headache    High cholesterol    History of appendectomy 10/26/2022   History of fainting spells of unknown cause    ? 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   Prediabetes 10/06/2023   Rheumatoid arthritis (HCC)    Seasonal allergies    Sleep apnea    does not use cpap   SOB (shortness of breath)    Vitamin D  deficiency     Past Surgical History:  Procedure Laterality Date   BUNIONECTOMY Right 2002   CARDIAC CATHETERIZATION  01/23/2016   In [place]   COLONOSCOPY     x 2 at ages 44yr and 54yr - [place]   EXTERNAL FIXATION REMOVAL Right 08/06/2021   Procedure: REMOVAL EXTERNAL FIXATION LEG;  Surgeon: Kendal Franky SQUIBB, MD;  Location: [facility] OR;  Service: Orthopedics;  Laterality: Right;  zimmer biomet ex-fix removal   LAPAROSCOPIC GASTRIC SLEEVE RESECTION  2017   [name]  MULTIPLE TOOTH EXTRACTIONS     has upper and lower dentures   ORIF ANKLE FRACTURE Right 08/06/2021   Procedure: OPEN REDUCTION INTERNAL FIXATION (ORIF) ANKLE FRACTURE;  Surgeon: Kendal Franky SQUIBB, MD;  Location: [facility] OR;  Service: Orthopedics;  Laterality: Right;  smith and nephew   SYNDESMOSIS REPAIR Right 12/15/2021   Procedure: SYNDESMOSIS REPAIR;  Surgeon: Kendal Franky SQUIBB, MD;  Location: [facility] OR;  Service: Orthopedics;  Laterality: Right;    Family History  Problem Relation Age of Onset   Cancer Mother    Heart disease Mother    Hyperlipidemia Mother    Alcoholism Mother    Alcoholism Father    Heart disease Brother    Colon cancer Neg Hx    Esophageal cancer Neg Hx    Stomach cancer Neg Hx    Rectal cancer Neg Hx     Social History   Socioeconomic  History   Marital status: Married    Spouse name: Christopher   Number of children: 1   Years of education: Not on file   Highest education level: GED or equivalent  Occupational History   Occupation: Disabled  Tobacco Use   Smoking status: Former    Current packs/day: 0.00    Average packs/day: 0.5 packs/day for 45.0 years (22.5 ttl pk-yrs)    Types: Cigarettes    Start date: 59    Quit date: 2014    Years since quitting: 11.8   Smokeless tobacco: Never  Vaping Use   Vaping status: Never Used  Substance and Sexual Activity   Alcohol  use: Never   Drug use: Never   Sexual activity: Not Currently  Other Topics Concern   Not on file  Social History Narrative   Lives with her husband. Has a dog   Social Drivers of Corporate investment banker Strain: Medium Risk (09/01/2024)   Overall Financial Resource Strain (CARDIA)    Difficulty of Paying Living Expenses: Somewhat hard  Food Insecurity: Food Insecurity Present (09/01/2024)   Hunger Vital Sign    Worried About Running Out of Food in the Last Year: Sometimes true    Ran Out of Food in the Last Year: Sometimes true  Transportation Needs: No Transportation Needs (09/01/2024)   PRAPARE - Administrator, Civil Service (Medical): No    Lack of Transportation (Non-Medical): No  Physical Activity: Inactive (09/01/2024)   Exercise Vital Sign    Days of Exercise per Week: 0 days    Minutes of Exercise per Session: Not on file  Stress: Stress Concern Present (09/01/2024)   [employer] of Occupational Health - Occupational Stress Questionnaire    Feeling of Stress: Rather much  Social Connections: Moderately Isolated (09/01/2024)   Social Connection and Isolation Panel    Frequency of Communication with Friends and Family: Twice a week    Frequency of Social Gatherings with Friends and Family: Once a week    Attends Religious Services: Never    Database administrator or Organizations: No    Attends Museum/gallery exhibitions officer: Not on file    Marital Status: Married  Catering manager Violence: Not At Risk (01/05/2024)   Received from [facility]   HITS    Over the last 12 months how often did your partner physically hurt you?: Never    Over the last 12 months how often did your partner insult you or talk down to you?: Never    Over the last 12 months how often  did your partner threaten you with physical harm?: Never    Over the last 12 months how often did your partner scream or curse at you?: Never    Outpatient Medications Prior to Visit  Medication Sig Dispense Refill   albuterol  (VENTOLIN  HFA) 108 (90 Base) MCG/ACT inhaler Inhale 1-2 puffs into the lungs every 6 (six) hours as needed for shortness of breath or wheezing.     Ascorbic Acid (VITAMIN C-ROSE HIPS CR) 1500 MG TBCR Take 1,500 mg by mouth daily.     atorvastatin  (LIPITOR ) 80 MG tablet Take 1 tablet (80 mg total) by mouth daily with supper. 90 tablet 0   b complex vitamins capsule Take 1 capsule by mouth daily.     Baclofen  5 MG TABS TAKE ONE TABLET BY MOUTH DAILY AT BEDTIME 90 tablet 0   budesonide (ENTOCORT EC) 3 MG 24 hr capsule Take three 3mg  capsules for 2 weeks (14 days) Take two 3mg  capsules for 2 weeks (14 days) Take one 3mg  capsule for 4 weeks (28 days) 98 capsule 1   buPROPion  (WELLBUTRIN  XL) 150 MG 24 hr tablet Take 1 tablet (150 mg total) by mouth every morning. 90 tablet 1   busPIRone  (BUSPAR ) 15 MG tablet TAKE ONE TABLET BY MOUTH THREE TIMES DAILY 270 tablet 0   Calcium  Citrate-Vitamin D  (CALCIUM  CITRATE + D PO) Take 1 tablet by mouth daily.     colestipol  (COLESTID ) 1 g tablet Take 1 tablet (1 g total) by mouth 2 (two) times daily. Take 2-4 hours apart from all other medications 60 tablet 2   DULoxetine  (CYMBALTA ) 60 MG capsule Take 2 capsules (120 mg total) by mouth at bedtime. 60 capsule 2   famotidine  (PEPCID ) 20 MG tablet famotidine  20 mg tablet    Status: Active  Code System: RxNorm     fludrocortisone   (FLORINEF ) 0.1 MG tablet Take 0.2 mg by mouth daily.     fluticasone  (FLONASE ) 50 MCG/ACT nasal spray Place 1 spray into both nostrils daily as needed for allergies or rhinitis.     gabapentin  (NEURONTIN ) 300 MG capsule Take 3 capsules by mouth in the morning, 2 capsules in the afternoon and 2 capsules at night 630 capsule 0   loratadine (CLARITIN) 10 MG tablet Take by mouth.     Multiple Vitamin (MULTIVITAMIN WITH MINERALS) TABS tablet Take 1 tablet by mouth daily.     ondansetron  (ZOFRAN -ODT) 4 MG disintegrating tablet Take 1 tablet by mouth every 8 (eight) hours as needed for nausea/vomiting.     oxybutynin  (DITROPAN ) 5 MG tablet TAKE 1 TABLET TWICE A DAY 180 tablet 0   pantoprazole  (PROTONIX ) 40 MG tablet Take 1 tablet (40 mg total) by mouth daily. 90 tablet 0   topiramate (TOPAMAX) 50 MG tablet TAKE ONE TABLET BY MOUTH ONE TIME DAILY 90 tablet 0   No facility-administered medications prior to visit.    Allergies  Allergen Reactions   Zolpidem Swelling    Tongue swelling, thrush symptoms.    ROS     Objective:    Physical Exam Constitutional:      General: She is not in acute distress.    Appearance: She is not ill-appearing.  HENT:     Mouth/Throat:     Mouth: Mucous membranes are moist.     Pharynx: Oropharynx is clear.  Eyes:     Extraocular Movements: Extraocular movements intact.     Conjunctiva/sclera: Conjunctivae normal.     Pupils: Pupils are equal, round, and reactive to  light.  Cardiovascular:     Rate and Rhythm: Normal rate and regular rhythm.  Pulmonary:     Effort: Pulmonary effort is normal.     Breath sounds: Normal breath sounds.  Musculoskeletal:     Cervical back: Normal range of motion and neck supple. No tenderness.     Right lower leg: No edema.     Left lower leg: No edema.  Lymphadenopathy:     Cervical: No cervical adenopathy.  Skin:    General: Skin is warm and dry.  Neurological:     General: No focal deficit present.     Mental  Status: She is alert and oriented to person, place, and time.     Motor: No weakness.     Coordination: Coordination normal.     Gait: Gait normal.  Psychiatric:        Mood and Affect: Mood normal.        Behavior: Behavior normal.        Thought Content: Thought content normal.      BP 112/82   Pulse (!) 108   Temp 97.6 F (36.4 C) (Temporal)   Ht 5' 4 (1.626 m)   Wt 247 lb (112 kg)   SpO2 99%   BMI 42.40 kg/m  Wt Readings from Last 3 Encounters:  09/01/24 247 lb (112 kg)  08/18/24 244 lb (110.7 kg)  07/20/24 246 lb (111.6 kg)       Assessment & Plan:   Problem List Items Addressed This Visit     Acquired hypothyroidism   Relevant Orders   TSH (Completed)   Prediabetes   Relevant Orders   Hemoglobin A1c (Completed)   Pure hypercholesterolemia   Relevant Orders   Lipid panel (Completed)   Other Visit Diagnoses       Acute right-sided thoracic back pain    -  Primary   Relevant Orders   CBC with Differential/Platelet (Completed)   Comprehensive metabolic panel with GFR (Completed)   DG Thoracic Spine W/Swimmers (Completed)     Acute right-sided low back pain without sciatica       Relevant Orders   CBC with Differential/Platelet (Completed)   Comprehensive metabolic panel with GFR (Completed)   Urinalysis, Routine w reflex microscopic   DG Lumbar Spine 2-3 Views (Completed)     Loose stools       Relevant Orders   CBC with Differential/Platelet (Completed)   Comprehensive metabolic panel with GFR (Completed)     Nausea         Chills         Urinary frequency         Dysuria       Relevant Orders   CBC with Differential/Platelet (Completed)   Comprehensive metabolic panel with GFR (Completed)   Urinalysis, Routine w reflex microscopic   Urine Culture       Assessment and Plan Assessment & Plan Severe acute back pain with possible urinary tract infection Severe acute back pain radiating from the lower back to the thoracic region, rated 10/10.  Differential diagnosis includes UTI and possible kidney stone. Symptoms of chills and urinary frequency with pain suggest a possible UTI. Dehydration is a concern due to poor water intake and chronic diarrhea. Elevated pulse may be due to dehydration, pain, or infection. - Order thoracic and lumbar spine x-rays. - Collect urine sample for urinalysis and culture. - Order complete blood count, electrolytes, and kidney function tests. - Advise increased fluid intake. - Instruct  to go to the emergency department if symptoms worsen or if unable to maintain hydration.  Chronic diarrhea and dehydration Chronic diarrhea persists, contributing to dehydration. Poor water intake reported. Dehydration likely exacerbating symptoms such as elevated pulse and dizziness. - Encourage increased fluid intake. - Advise emergency care if signs of worsening dehydration occur.  Anxiety and depression New symptoms of severe back pain and chills discussed. No changes in management at this time.  Gastroesophageal reflux disease (GERD) No new symptoms or changes in management discussed.  General Health Maintenance Flu vaccination deferred until recovery from current illness. - Administer flu shot once recovered from current illness.     I am having Angela RONAL Mahoney Deb maintain her albuterol , ondansetron , Calcium  Citrate-Vitamin D  (CALCIUM  CITRATE + D PO), multivitamin with minerals, fluticasone , fludrocortisone , VITAMIN C-ROSE HIPS CR, loratadine, topiramate, b complex vitamins, Baclofen , DULoxetine , buPROPion , busPIRone , famotidine , colestipol , pantoprazole , atorvastatin , oxybutynin , budesonide, and gabapentin .  No orders of the defined types were placed in this encounter.

## 2024-09-01 NOTE — Patient Instructions (Signed)
 Please go downstairs for labs, X ray and urine testing.   Increase your fluid intake.   You can take the nausea medication you have at home, ondansetron .   If you feel worse or have any new symptoms, please go to the emergency department.   I will be in touch with your results.

## 2024-09-04 ENCOUNTER — Other Ambulatory Visit: Payer: Self-pay | Admitting: Family Medicine

## 2024-09-04 LAB — URINE CULTURE

## 2024-09-04 MED ORDER — SULFAMETHOXAZOLE-TRIMETHOPRIM 800-160 MG PO TABS: 1.0000 | ORAL_TABLET | Freq: Two times a day (BID) | ORAL | 0 refills | Status: DC

## 2024-09-05 ENCOUNTER — Encounter (INDEPENDENT_AMBULATORY_CARE_PROVIDER_SITE_OTHER): Payer: Self-pay

## 2024-09-05 ENCOUNTER — Ambulatory Visit (INDEPENDENT_AMBULATORY_CARE_PROVIDER_SITE_OTHER): Admitting: Psychology

## 2024-09-05 DIAGNOSIS — F4010 Social phobia, unspecified: Secondary | ICD-10-CM | POA: Diagnosis not present

## 2024-09-05 DIAGNOSIS — F331 Major depressive disorder, recurrent, moderate: Secondary | ICD-10-CM | POA: Diagnosis not present

## 2024-09-05 NOTE — Progress Notes (Signed)
 [facility] Counselor/Therapist Progress Note  Patient ID: Angela Mahoney, MRN: [id number],    Date: 09/05/2024  Time Spent: 9:05am-9:56am   Pt is seen for a virtual video visit via caregility.    Pt joins from her home, reporting privacy, and counselor from her home office.  Pt consents of virtual visits and is aware of limitation so such visits.  Treatment Type: Individual Therapy  Reported Symptoms: pt reports feeling tired and down w/ her medical issues  Mental Status Exam: Appearance:  Well Groomed     Behavior: Appropriate  Motor: Normal  Speech/Language:  Clear and Coherent  Affect: Appropriate  Mood: depressed  Thought process: normal  Thought content:   WNL  Sensory/Perceptual disturbances:   WNL  Orientation: oriented to person, place, time/date, and situation  Attention: Good  Concentration: Good  Memory: WNL  Fund of knowledge:  Good  Insight:   Good  Judgment:  Good  Impulse Control: Good   Risk Assessment: Danger to Self:  No Self-injurious Behavior: No Danger to Others: No Duty to Warn:no Physical Aggression / Violence:No  Access to Firearms a concern: No  Gang Involvement:No   Subjective: Counselor assessed pt current functioning per pt report.  Processed w/ pt continued medical stressors and impact on mood. Validated and normalized feelings and encouraged self care.  Encouraged continued and connecting w/ community when able. Pt affect congruent w/ feeling down. Pt reports she started having severe pain in back around to her side and f/u w/ doctor 4 days ago and labs just came back positive for UTI- no other issues found.  Pt reports she has been in discomfort and impacting her feeling down.  Pt reports interactions w/ daughter ok. Pt reports didn't f/u w/ faith community to identify resources and commits to calling this week.    Interventions: Cognitive Behavioral Therapy, Assertiveness/Communication, and  supportive  Diagnosis:Major depressive disorder, recurrent episode, moderate (HCC)  Social anxiety disorder  Plan: Pt to f/u in 2 week w/ counseling.  Pt to f/u as scheduled w/ PCP and specialist as scheduled.     ndividualized Treatment Plan Strengths: seeking counseling.  Pt enjoys crocheting.   Supports: husband and daughter   Goal/Needs for Treatment:  In order of importance to patient 1) coping w/ depression and anxiety 2) work on grief related to losses.   3) --   Client Statement of Needs: I need to work though stressors and losses- My daughter's stuff, my mom's stuff, my grief w/ my mom, Work on my stuff with him (husband) and losing him.   Treatment Level:outpatient counseling  Symptoms:depression, anxiety, worry, fatigue, grief, sleep disturbance  Client Treatment Preferences:Weekly to biweekly counseling.   Healthcare consumer's goal for treatment:  Counselor, Damien Herald, [facility] will support the patient's ability to achieve the goals identified. Cognitive Behavioral Therapy, Assertive Communication/Conflict Resolution Training, Relaxation Training, ACT, Humanistic and other evidenced-based practices will be used to promote progress towards healthy functioning.   Healthcare consumer will: Actively participate in therapy, working towards healthy functioning.    *Justification for Continuation/Discontinuation of Goal: R=Revised, O=Ongoing, A=Achieved, D=Discontinued  Goal 1) Manage depression and anxiety, reframing related thoughts, self care and behavioral management AEB pt report of decreased symptoms and therapist observation.  Baseline date 03/16/24: Progress towards goal 0; How Often - Daily Target Date Goal Was reviewed Status Code Progress towards goal/Likert rating  03/16/25                Goal 2)  Increase effective coping w/ losses and grief through effective expression of feelings AEB pt verbalizing in session and therapist observation.  Baseline date 03/16/24:  Progress towards goal 0/; How Often - Daily Target Date Goal Was reviewed Status Code Progress towards goal  03/16/25                 This plan has been reviewed and created by the following participants:  This plan will be reviewed at least every 12 months. Date Behavioral Health Clinician Date Guardian/Patient   03/16/24 [facility] 03/16/24 Verbal Consent Provided and signature on file           [place], [facility]

## 2024-09-12 ENCOUNTER — Other Ambulatory Visit: Payer: Self-pay | Admitting: Medical Genetics

## 2024-09-12 DIAGNOSIS — Z006 Encounter for examination for normal comparison and control in clinical research program: Secondary | ICD-10-CM

## 2024-09-14 ENCOUNTER — Other Ambulatory Visit: Payer: Self-pay | Admitting: Family Medicine

## 2024-09-14 DIAGNOSIS — E7849 Other hyperlipidemia: Secondary | ICD-10-CM

## 2024-09-19 ENCOUNTER — Ambulatory Visit (INDEPENDENT_AMBULATORY_CARE_PROVIDER_SITE_OTHER): Admitting: Psychology

## 2024-09-19 DIAGNOSIS — F4321 Adjustment disorder with depressed mood: Secondary | ICD-10-CM | POA: Diagnosis not present

## 2024-09-19 DIAGNOSIS — F331 Major depressive disorder, recurrent, moderate: Secondary | ICD-10-CM | POA: Diagnosis not present

## 2024-09-19 DIAGNOSIS — F4010 Social phobia, unspecified: Secondary | ICD-10-CM | POA: Diagnosis not present

## 2024-09-19 NOTE — Progress Notes (Signed)
 [facility] Counselor/Therapist Progress Note  Patient ID: Angela Mahoney, MRN: [id number],    Date: 09/19/2024  Time Spent: 9:01am-9:54am   Pt is seen for a virtual video visit via caregility.    Pt joins from her home, reporting privacy, and counselor from her home office.  Pt consents of virtual visits and is aware of limitation so such visits.  Treatment Type: Individual Therapy  Reported Symptoms: pt reports interactions w/ daughter positive and mood ok Mental Status Exam: Appearance:  Well Groomed     Behavior: Appropriate  Motor: Normal  Speech/Language:  Clear and Coherent  Affect: Appropriate  Mood: anxious  Thought process: normal  Thought content:   WNL  Sensory/Perceptual disturbances:   WNL  Orientation: oriented to person, place, time/date, and situation  Attention: Good  Concentration: Good  Memory: WNL  Fund of knowledge:  Good  Insight:   Good  Judgment:  Good  Impulse Control: Good   Risk Assessment: Danger to Self:  No Self-injurious Behavior: No Danger to Others: No Duty to Warn:no Physical Aggression / Violence:No  Access to Firearms a concern: No  Gang Involvement:No   Subjective: Counselor assessed pt current functioning per pt report.  Processed w/ pt positives, stressors and moods.  Explored positive interactions w/ daughter.  Reflected positives of taking steps discussed w/ engaging w/ community.  Validated and normalized continued frustrations w/ her medical issues.  Discussed steps taking towards other tasks and goals. Pt affect wnl.  Pt reports mood has been ok.  Pt reports some worry and frustration w/ continued GI issues.  Pt reflected on positive interactions w/ her daughter.  Pt discussed progress w/ connection w/ online church and her next steps for bible study group.  Pt reports on tasks around the house that completing and identifying next steps for projects.   Interventions: Cognitive Behavioral Therapy,  Solution-Oriented/Positive Psychology, and supportive  Diagnosis:Major depressive disorder, recurrent episode, moderate (HCC)  Social anxiety disorder  Grief  Plan: Pt to f/u in 2 week w/ counseling.  Pt to f/u as scheduled w/ PCP and specialist as scheduled.     ndividualized Treatment Plan Strengths: seeking counseling.  Pt enjoys crocheting.   Supports: husband and daughter   Goal/Needs for Treatment:  In order of importance to patient 1) coping w/ depression and anxiety 2) work on grief related to losses.   3) --   Client Statement of Needs: I need to work though stressors and losses- My daughter's stuff, my mom's stuff, my grief w/ my mom, Work on my stuff with him (husband) and losing him.   Treatment Level:outpatient counseling  Symptoms:depression, anxiety, worry, fatigue, grief, sleep disturbance  Client Treatment Preferences:Weekly to biweekly counseling.   Healthcare consumer's goal for treatment:  Counselor, Damien Herald, [facility] will support the patient's ability to achieve the goals identified. Cognitive Behavioral Therapy, Assertive Communication/Conflict Resolution Training, Relaxation Training, ACT, Humanistic and other evidenced-based practices will be used to promote progress towards healthy functioning.   Healthcare consumer will: Actively participate in therapy, working towards healthy functioning.    *Justification for Continuation/Discontinuation of Goal: R=Revised, O=Ongoing, A=Achieved, D=Discontinued  Goal 1) Manage depression and anxiety, reframing related thoughts, self care and behavioral management AEB pt report of decreased symptoms and therapist observation.  Baseline date 03/16/24: Progress towards goal 0; How Often - Daily Target Date Goal Was reviewed Status Code Progress towards goal/Likert rating  03/16/25  Goal 2) Increase effective coping w/ losses and grief through effective expression of feelings AEB pt verbalizing in session  and therapist observation.  Baseline date 03/16/24: Progress towards goal 0/; How Often - Daily Target Date Goal Was reviewed Status Code Progress towards goal  03/16/25                 This plan has been reviewed and created by the following participants:  This plan will be reviewed at least every 12 months. Date Behavioral Health Clinician Date Guardian/Patient   03/16/24 [facility] 03/16/24 Verbal Consent Provided and signature on file            [place], [facility]

## 2024-09-22 ENCOUNTER — Ambulatory Visit (INDEPENDENT_AMBULATORY_CARE_PROVIDER_SITE_OTHER)

## 2024-09-22 VITALS — Ht 64.0 in | Wt 246.0 lb

## 2024-09-22 DIAGNOSIS — Z87891 Personal history of nicotine dependence: Secondary | ICD-10-CM | POA: Diagnosis not present

## 2024-09-22 DIAGNOSIS — Z0000 Encounter for general adult medical examination without abnormal findings: Secondary | ICD-10-CM

## 2024-09-22 DIAGNOSIS — Z122 Encounter for screening for malignant neoplasm of respiratory organs: Secondary | ICD-10-CM

## 2024-09-22 NOTE — Progress Notes (Signed)
 Subjective:  Please attest and cosign this visit due to patients primary care provider not being in the office at the time the visit was completed.  (Pt of [name], NP)   Angela Mahoney is a 63 y.o. female who presents for a Medicare Annual Wellness Visit.  I connected with  Maurice Ann Neubert on 09/22/24 by a audio enabled telemedicine application and verified that I am speaking with the correct person using two identifiers.  Patient Location: Home  Provider Location: Office/Clinic  Persons Participating in Visit: Patient.  I discussed the limitations of evaluation and management by telemedicine. The patient expressed understanding and agreed to proceed.  Vital Signs: Because this visit was a virtual/telehealth visit, some criteria may be missing or patient reported. Any vitals not documented were not able to be obtained and vitals that have been documented are patient reported.   Allergies (verified) Zolpidem   History: Past Medical History:  Diagnosis Date   Anemia    Anxiety    Arthritis    Back pain    Bilateral swelling of feet    Chest pain    Chronic fatigue syndrome    COPD (chronic obstructive pulmonary disease) (HCC) 2017   Coronary artery disease    Ozell Kubas, [place]   Depression    Dyspnea    with exertion   Falls    Fibromyalgia    GERD (gastroesophageal reflux disease)    Headache    High cholesterol    History of appendectomy 10/26/2022   History of fainting spells of unknown cause    ? 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   Prediabetes 10/06/2023   Rheumatoid arthritis (HCC)    Seasonal allergies    Sleep apnea    does not use cpap   SOB (shortness of breath)    Vitamin D  deficiency    Past Surgical History:  Procedure  Laterality Date   BUNIONECTOMY Right 2002   CARDIAC CATHETERIZATION  01/23/2016   In [place]   COLONOSCOPY     x 2 at ages 70yr and 55yr - [place]   EXTERNAL FIXATION REMOVAL Right 08/06/2021   Procedure: REMOVAL EXTERNAL FIXATION LEG;  Surgeon: Kendal Franky SQUIBB, MD;  Location: [facility] OR;  Service: Orthopedics;  Laterality: Right;  zimmer biomet ex-fix removal   LAPAROSCOPIC GASTRIC SLEEVE RESECTION  2017   [place]   MULTIPLE TOOTH EXTRACTIONS     has upper and lower dentures   ORIF ANKLE FRACTURE Right 08/06/2021   Procedure: OPEN REDUCTION INTERNAL FIXATION (ORIF) ANKLE FRACTURE;  Surgeon: Kendal Franky SQUIBB, MD;  Location: [facility] OR;  Service: Orthopedics;  Laterality: Right;  smith and nephew   SYNDESMOSIS REPAIR Right 12/15/2021   Procedure: SYNDESMOSIS REPAIR;  Surgeon: Kendal Franky SQUIBB, MD;  Location: [facility] OR;  Service: Orthopedics;  Laterality: Right;   Family History  Problem Relation Age of Onset   Cancer Mother    Heart disease Mother    Hyperlipidemia Mother    Alcoholism Mother    Alcoholism Father    Heart disease Brother    Colon cancer Neg Hx    Esophageal cancer Neg Hx    Stomach cancer Neg Hx    Rectal cancer Neg Hx    Social History   Occupational History  Occupation: Disabled  Tobacco Use   Smoking status: Former    Current packs/day: 0.00    Average packs/day: 0.5 packs/day for 45.0 years (22.5 ttl pk-yrs)    Types: Cigarettes    Start date: 35    Quit date: 2014    Years since quitting: 11.8   Smokeless tobacco: Never  Vaping Use   Vaping status: Never Used  Substance and Sexual Activity   Alcohol  use: Yes    Alcohol /week: 1.0 standard drink of alcohol     Types: 1 Shots of liquor per week    Comment: occas   Drug use: Never   Sexual activity: Not Currently   Tobacco Counseling Counseling given: Not Answered  SDOH Screenings   Food Insecurity: No Food Insecurity (09/22/2024)  Recent Concern: Food Insecurity - Food Insecurity Present (09/01/2024)   Housing: Unknown (09/22/2024)  Transportation Needs: No Transportation Needs (09/22/2024)  Utilities: Not At Risk (09/22/2024)  Alcohol  Screen: Low Risk  (09/01/2024)  Depression (PHQ2-9): Low Risk  (09/22/2024)  Recent Concern: Depression (PHQ2-9) - High Risk (09/01/2024)  Financial Resource Strain: Medium Risk (09/01/2024)  Physical Activity: Inactive (09/22/2024)  Social Connections: Moderately Isolated (09/22/2024)  Stress: Stress Concern Present (09/22/2024)  Tobacco Use: Medium Risk (09/22/2024)  Health Literacy: Adequate Health Literacy (09/22/2024)   Depression Screen    09/22/2024   11:07 AM 09/01/2024   11:11 AM 05/15/2024    2:03 PM 02/28/2024    2:05 PM 09/22/2023    8:39 AM 08/06/2023   10:29 AM 05/06/2023    9:51 AM  PHQ 2/9 Scores  PHQ - 2 Score 0 4 4 2 1 2  0  PHQ- 9 Score 1 14  17  14  6  6   0      Data saved with a previous flowsheet row definition     Goals Addressed               This Visit's Progress     Patient Stated (pt-stated)        Patient stated she is currently in a weight management program - [employer]       Visit info / Clinical Intake: Medicare Wellness Visit Type:: Subsequent Annual Wellness Visit Medicare Wellness Visit Mode:: Telephone If telephone:: video declined If telephone or video:: pt reported vitals Interpreter Needed?: No Pre-visit prep was completed: yes AWV questionnaire completed by patient prior to visit?: no Living arrangements:: (!) lives alone Patient's Overall Health Status Rating: good Typical amount of pain: none Does pain affect daily life?: no Are you currently prescribed opioids?: no  Dietary Habits and Nutritional Risks How many meals a day?: 3 Eats fruit and vegetables daily?: yes Most meals are obtained by: preparing own meals In the last 2 weeks, have you had any of the following?: -- (none) Diabetic:: no Any non-healing wounds?: no How often do you check your BS?: -- (does not check glucose level) Would  you like to be referred to a Nutritionist or for Diabetic Management? : no  Functional Status Activities of Daily Living (to include ambulation/medication): Independent Ambulation: Independent with device- listed below Home Assistive Devices/Equipment: Dentures (specify type); Eyeglasses; Walker (specify Type); Cane Medication Administration: Independent Home Management: Independent Manage your own finances?: yes Primary transportation is: family/friends (Spouse drives) Concerns about vision?: no *vision screening is required for WTM* Concerns about hearing?: no  Fall Screening Falls in the past year?: 1 Number of falls in past year: 0 (3) Was there an injury with Fall?: 0 Fall Risk Category  Calculator: 1 Patient Fall Risk Level: Low Fall Risk  Fall Risk Patient at Risk for Falls Due to: Impaired balance/gait Fall risk Follow up: Falls evaluation completed; Falls prevention discussed  Home and Transportation Safety: All rugs have non-skid backing?: N/A, no rugs All stairs or steps have railings?: N/A, no stairs Grab bars in the bathtub or shower?: yes Have non-skid surface in bathtub or shower?: yes Good home lighting?: yes Regular seat belt use?: yes Hospital stays in the last year:: no  Cognitive Assessment Difficulty concentrating, remembering, or making decisions? : no Will 6CIT or Mini Cog be Completed: yes What year is it?: 0 points What month is it?: 0 points Give patient an address phrase to remember (5 components): [address] [place], [place] About what time is it?: 0 points Count backwards from 20 to 1: 0 points Say the months of the year in reverse: 0 points Repeat the address phrase from earlier: 0 points 6 CIT Score: 0 points  Advance Directives (For Healthcare) Does Patient Have a Medical Advance Directive?: No Would patient like information on creating a medical advance directive?: No - Patient declined  Reviewed/Updated  Reviewed/Updated: All         Objective:    Today's Vitals   09/22/24 1055  Weight: 246 lb (111.6 kg)  Height: 5' 4 (1.626 m)   Body mass index is 42.23 kg/m.  Current Medications (verified) Outpatient Encounter Medications as of 09/22/2024  Medication Sig   albuterol  (VENTOLIN  HFA) 108 (90 Base) MCG/ACT inhaler Inhale 1-2 puffs into the lungs every 6 (six) hours as needed for shortness of breath or wheezing.   Ascorbic Acid (VITAMIN C-ROSE HIPS CR) 1500 MG TBCR Take 1,500 mg by mouth daily.   atorvastatin  (LIPITOR ) 80 MG tablet Take 1 tablet (80 mg total) by mouth daily with supper.   b complex vitamins capsule Take 1 capsule by mouth daily.   Baclofen  5 MG TABS TAKE ONE TABLET BY MOUTH DAILY AT BEDTIME   budesonide (ENTOCORT EC) 3 MG 24 hr capsule Take three 3mg  capsules for 2 weeks (14 days) Take two 3mg  capsules for 2 weeks (14 days) Take one 3mg  capsule for 4 weeks (28 days)   buPROPion  (WELLBUTRIN  XL) 150 MG 24 hr tablet Take 1 tablet (150 mg total) by mouth every morning.   busPIRone  (BUSPAR ) 15 MG tablet TAKE ONE TABLET BY MOUTH THREE TIMES DAILY   Calcium  Citrate-Vitamin D  (CALCIUM  CITRATE + D PO) Take 1 tablet by mouth daily.   colestipol  (COLESTID ) 1 g tablet Take 1 tablet (1 g total) by mouth 2 (two) times daily. Take 2-4 hours apart from all other medications   DULoxetine  (CYMBALTA ) 60 MG capsule Take 2 capsules (120 mg total) by mouth at bedtime.   famotidine  (PEPCID ) 20 MG tablet famotidine  20 mg tablet    Status: Active  Code System: RxNorm   fludrocortisone  (FLORINEF ) 0.1 MG tablet Take 0.2 mg by mouth daily.   fluticasone  (FLONASE ) 50 MCG/ACT nasal spray Place 1 spray into both nostrils daily as needed for allergies or rhinitis.   gabapentin  (NEURONTIN ) 300 MG capsule Take 3 capsules by mouth in the morning, 2 capsules in the afternoon and 2 capsules at night   loratadine (CLARITIN) 10 MG tablet Take by mouth.   Multiple Vitamin (MULTIVITAMIN WITH MINERALS) TABS tablet Take 1 tablet by mouth  daily.   ondansetron  (ZOFRAN -ODT) 4 MG disintegrating tablet Take 1 tablet by mouth every 8 (eight) hours as needed for nausea/vomiting.  oxybutynin  (DITROPAN ) 5 MG tablet TAKE 1 TABLET TWICE A DAY   pantoprazole  (PROTONIX ) 40 MG tablet Take 1 tablet (40 mg total) by mouth daily.   topiramate (TOPAMAX) 50 MG tablet TAKE ONE TABLET BY MOUTH ONE TIME DAILY   sulfamethoxazole-trimethoprim (BACTRIM DS) 800-160 MG tablet Take 1 tablet by mouth 2 (two) times daily. (Patient not taking: Reported on 09/22/2024)   No facility-administered encounter medications on file as of 09/22/2024.   Hearing/Vision screen Hearing Screening - Comments:: Denies hearing difficulties   Vision Screening - Comments:: Wears rx glasses - up to date with routine eye exams  Immunizations and Health Maintenance Health Maintenance  Topic Date Due   HIV Screening  Never done   Hepatitis C Screening  Never done   Zoster Vaccines- Shingrix (1 of 2) Never done   Lung Cancer Screening  Never done   Influenza Vaccine  06/16/2024   Medicare Annual Wellness (AWV)  09/22/2025   Mammogram  02/16/2026   Cervical Cancer Screening (HPV/Pap Cotest)  02/10/2028   DTaP/Tdap/Td (2 - Tdap) 12/19/2028   Colonoscopy  07/20/2029   Pneumococcal Vaccine: 50+ Years  Completed   DEXA SCAN  Completed   Hepatitis B Vaccines 19-59 Average Risk  Aged Out   Meningococcal B Vaccine  Aged Out   COVID-19 Vaccine  Discontinued   Fecal DNA (Cologuard)  Discontinued        Assessment/Plan:  This is a routine wellness examination for Angela Mahoney.  Patient Care Team: Lendia Boby CROME, NP-C as PCP - General (Family Medicine) Poudyal, Ritesh, OD (Optometry)  I have personally reviewed and noted the following in the patient's chart:   Medical and social history Use of alcohol , tobacco or illicit drugs  Current medications and supplements including opioid prescriptions. Functional ability and status Nutritional status Physical activity Advanced  directives List of other physicians Hospitalizations, surgeries, and ER visits in previous 12 months Vitals Screenings to include cognitive, depression, and falls Referrals and appointments  Orders Placed This Encounter  Procedures   Ambulatory Referral for Lung Cancer Scre    Referral Priority:   Routine    Referral Type:   Consultation    Referral Reason:   Specialty Services Required    Referred to Provider:   Ruthell Lauraine FALCON, NP    Number of Visits Requested:   1   In addition, I have reviewed and discussed with patient certain preventive protocols, quality metrics, and best practice recommendations. A written personalized care plan for preventive services as well as general preventive health recommendations were provided to patient.   Verdie CHRISTELLA Saba, CMA   09/22/2024   Return in 1 year (on 09/22/2025).  After Visit Summary: (MyChart) Due to this being a telephonic visit, the after visit summary with patients personalized plan was offered to patient via MyChart   Nurse Notes: Scheduled a 3-mth f/u w/PCP

## 2024-09-22 NOTE — Patient Instructions (Addendum)
 Angela Mahoney,  Thank you for taking the time for your Medicare Wellness Visit. I appreciate your continued commitment to your health goals. Please review the care plan we discussed, and feel free to reach out if I can assist you further.  Please note that Annual Wellness Visits do not include a physical exam. Some assessments may be limited, especially if the visit was conducted virtually. If needed, we may recommend an in-person follow-up with your provider.  Ongoing Care Seeing your primary care provider every 3 to 6 months helps us  monitor your health and provide consistent, personalized care.   Referrals If a referral was made during today's visit and you haven't received any updates within two weeks, please contact the referred provider directly to check on the status.  Recommended Screenings:  Health Maintenance  Topic Date Due   HIV Screening  Never done   Hepatitis C Screening  Never done   Zoster (Shingles) Vaccine (1 of 2) Never done   Screening for Lung Cancer  Never done   Flu Shot  06/16/2024   Medicare Annual Wellness Visit  09/22/2025   Breast Cancer Screening  02/16/2026   Pap with HPV screening  02/10/2028   DTaP/Tdap/Td vaccine (2 - Tdap) 12/19/2028   Colon Cancer Screening  07/20/2029   Pneumococcal Vaccine for age over 67  Completed   DEXA scan (bone density measurement)  Completed   Hepatitis B Vaccine  Aged Out   Meningitis B Vaccine  Aged Out   COVID-19 Vaccine  Discontinued   Cologuard (Stool DNA test)  Discontinued       09/22/2024   10:58 AM  Advanced Directives  Does Patient Have a Medical Advance Directive? No  Would patient like information on creating a medical advance directive? No - Patient declined    Vision: Annual vision screenings are recommended for early detection of glaucoma, cataracts, and diabetic retinopathy. These exams can also reveal signs of chronic conditions such as diabetes and high blood pressure.  Dental: Annual dental  screenings help detect early signs of oral cancer, gum disease, and other conditions linked to overall health, including heart disease and diabetes.

## 2024-09-27 ENCOUNTER — Ambulatory Visit (INDEPENDENT_AMBULATORY_CARE_PROVIDER_SITE_OTHER): Admitting: Family Medicine

## 2024-09-27 ENCOUNTER — Encounter: Payer: Self-pay | Admitting: Family Medicine

## 2024-09-27 VITALS — BP 112/74 | HR 95 | Temp 97.9°F | Ht 64.0 in | Wt 243.0 lb

## 2024-09-27 DIAGNOSIS — M25551 Pain in right hip: Secondary | ICD-10-CM

## 2024-09-27 DIAGNOSIS — G8929 Other chronic pain: Secondary | ICD-10-CM

## 2024-09-27 DIAGNOSIS — N9489 Other specified conditions associated with female genital organs and menstrual cycle: Secondary | ICD-10-CM | POA: Diagnosis not present

## 2024-09-27 DIAGNOSIS — M797 Fibromyalgia: Secondary | ICD-10-CM | POA: Diagnosis not present

## 2024-09-27 DIAGNOSIS — N3946 Mixed incontinence: Secondary | ICD-10-CM

## 2024-09-27 DIAGNOSIS — K529 Noninfective gastroenteritis and colitis, unspecified: Secondary | ICD-10-CM | POA: Diagnosis not present

## 2024-09-27 DIAGNOSIS — I700 Atherosclerosis of aorta: Secondary | ICD-10-CM

## 2024-09-27 DIAGNOSIS — M79605 Pain in left leg: Secondary | ICD-10-CM

## 2024-09-27 DIAGNOSIS — E039 Hypothyroidism, unspecified: Secondary | ICD-10-CM | POA: Diagnosis not present

## 2024-09-27 DIAGNOSIS — M25552 Pain in left hip: Secondary | ICD-10-CM

## 2024-09-27 DIAGNOSIS — M79604 Pain in right leg: Secondary | ICD-10-CM

## 2024-09-27 NOTE — Progress Notes (Signed)
 Subjective:     Patient ID: Angela Mahoney, female    DOB: 1961-03-14, 63 y.o.   MRN: [id number]  Chief Complaint  Patient presents with   Medical Management of Chronic Issues    3 month f/u, still having leg edema    HPI  Discussed the use of AI scribe software for clinical note transcription with the patient, who gave verbal consent to proceed.  History of Present Illness Angela Mahoney is a 63 year old female who presents for follow-up of chronic diarrhea and fibromyalgia.  Chronic diarrhea - Chronic diarrhea significantly impacts daily life and ability to attend medical appointments - No improvement despite treatment by gastroenterologist - Colonoscopy revealed a couple of polyps that were removed; no further issues identified - Diarrhea has caused rescheduling of urology appointments  Generalized musculoskeletal pain and fibromyalgia - Worsening fibromyalgia with significant pain in legs, hips, and back - Pain limits mobility and daily activities - Duloxetine  no longer effective for pain control - Currently on maximum dose of gabapentin   Urinary incontinence - Urinary incontinence present - Referred to urology, but unable to attend appointments due to diarrhea  Gait instability and fall risk - History of foot issues, including prior surgery and physical therapy - Three falls experienced this year - Uses a walking aid due to fall risk - Husband unable to assist with care due to his Parkinson's disease  Metabolic status - Recent laboratory testing shows normal A1c levels, attributed to dietary changes  Socioeconomic concerns - Concerned about medical costs - Limited grocery budget     Health Maintenance Due  Topic Date Due   HIV Screening  Never done   Hepatitis C Screening  Never done   Zoster Vaccines- Shingrix (1 of 2) Never done   Lung Cancer Screening  Never done   Influenza Vaccine  06/16/2024    Past Medical History:   Diagnosis Date   Anemia    Anxiety    Arthritis    Back pain    Bilateral swelling of feet    Chest pain    Chronic fatigue syndrome    COPD (chronic obstructive pulmonary disease) (HCC) 2017   Coronary artery disease    Ozell Kubas, PA   Depression    Dyspnea    with exertion   Falls    Fibromyalgia    GERD (gastroesophageal reflux disease)    Headache    High cholesterol    History of appendectomy 10/26/2022   History of fainting spells of unknown cause    ? 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/01/2023   Prediabetes 10/06/2023   Rheumatoid arthritis (HCC)    Seasonal allergies    Sleep apnea    does not use cpap   SOB (shortness of breath)    Vitamin D  deficiency     Past Surgical History:  Procedure Laterality Date   BUNIONECTOMY Right 2002   CARDIAC CATHETERIZATION  01/23/2016   In [place]   COLONOSCOPY     x 2 at ages 77yr and 6yr - [place]   EXTERNAL FIXATION REMOVAL Right 08/06/2021   Procedure: REMOVAL EXTERNAL FIXATION LEG;  Surgeon: Kendal Franky SQUIBB, MD;  Location: [facility];  Service: Orthopedics;  Laterality: Right;  zimmer biomet ex-fix removal  LAPAROSCOPIC GASTRIC SLEEVE RESECTION  2017   [place]   MULTIPLE TOOTH EXTRACTIONS     has upper and lower dentures   ORIF ANKLE FRACTURE Right 08/06/2021   Procedure: OPEN REDUCTION INTERNAL FIXATION (ORIF) ANKLE FRACTURE;  Surgeon: Kendal Franky SQUIBB, MD;  Location: [facility];  Service: Orthopedics;  Laterality: Right;  smith and nephew   SYNDESMOSIS REPAIR Right 12/15/2021   Procedure: SYNDESMOSIS REPAIR;  Surgeon: Kendal Franky SQUIBB, MD;  Location: [facility];  Service: Orthopedics;  Laterality: Right;    Family History  Problem Relation Age of Onset   Cancer Mother    Heart disease Mother    Hyperlipidemia Mother    Alcoholism  Mother    Alcoholism Father    Heart disease Brother    Colon cancer Neg Hx    Esophageal cancer Neg Hx    Stomach cancer Neg Hx    Rectal cancer Neg Hx     Social History   Socioeconomic History   Marital status: Married    Spouse name: Christopher   Number of children: 1   Years of education: Not on file   Highest education level: GED or equivalent  Occupational History   Occupation: Disabled  Tobacco Use   Smoking status: Former    Current packs/day: 0.00    Average packs/day: 0.5 packs/day for 45.0 years (22.5 ttl pk-yrs)    Types: Cigarettes    Start date: 37    Quit date: 2014    Years since quitting: 11.8   Smokeless tobacco: Never  Vaping Use   Vaping status: Never Used  Substance and Sexual Activity   Alcohol  use: Yes    Alcohol /week: 1.0 standard drink of alcohol     Types: 1 Shots of liquor per week    Comment: occas   Drug use: Never   Sexual activity: Not Currently  Other Topics Concern   Not on file  Social History Narrative   Lives with her husband. Has a dog   Social Drivers of Corporate Investment Banker Strain: Medium Risk (09/01/2024)   Overall Financial Resource Strain (CARDIA)    Difficulty of Paying Living Expenses: Somewhat hard  Food Insecurity: No Food Insecurity (09/22/2024)   Hunger Vital Sign    Worried About Running Out of Food in the Last Year: Never true    Ran Out of Food in the Last Year: Never true  Recent Concern: Food Insecurity - Food Insecurity Present (09/01/2024)   Hunger Vital Sign    Worried About Running Out of Food in the Last Year: Sometimes true    Ran Out of Food in the Last Year: Sometimes true  Transportation Needs: No Transportation Needs (09/22/2024)   PRAPARE - Administrator, Civil Service (Medical): No    Lack of Transportation (Non-Medical): No  Physical Activity: Inactive (09/22/2024)   Exercise Vital Sign    Days of Exercise per Week: 0 days    Minutes of Exercise per Session: 0 min  Stress:  Stress Concern Present (09/22/2024)   [employer] of Occupational Health - Occupational Stress Questionnaire    Feeling of Stress: To some extent  Social Connections: Moderately Isolated (09/22/2024)   Social Connection and Isolation Panel    Frequency of Communication with Friends and Family: Twice a week    Frequency of Social Gatherings with Friends and Family: Once a week    Attends Religious Services: Never    Database Administrator or Organizations: No    Attends  Club or Organization Meetings: Never    Marital Status: Married  Catering Manager Violence: Not At Risk (09/22/2024)   Humiliation, Afraid, Rape, and Kick questionnaire    Fear of Current or Ex-Partner: No    Emotionally Abused: No    Physically Abused: No    Sexually Abused: No    Outpatient Medications Prior to Visit  Medication Sig Dispense Refill   albuterol  (VENTOLIN  HFA) 108 (90 Base) MCG/ACT inhaler Inhale 1-2 puffs into the lungs every 6 (six) hours as needed for shortness of breath or wheezing.     Ascorbic Acid (VITAMIN C-ROSE HIPS CR) 1500 MG TBCR Take 1,500 mg by mouth daily.     atorvastatin  (LIPITOR ) 80 MG tablet Take 1 tablet (80 mg total) by mouth daily with supper. 90 tablet 0   b complex vitamins capsule Take 1 capsule by mouth daily.     Baclofen  5 MG TABS TAKE ONE TABLET BY MOUTH DAILY AT BEDTIME 90 tablet 0   budesonide (ENTOCORT EC) 3 MG 24 hr capsule Take three 3mg  capsules for 2 weeks (14 days) Take two 3mg  capsules for 2 weeks (14 days) Take one 3mg  capsule for 4 weeks (28 days) 98 capsule 1   buPROPion  (WELLBUTRIN  XL) 150 MG 24 hr tablet Take 1 tablet (150 mg total) by mouth every morning. 90 tablet 1   busPIRone  (BUSPAR ) 15 MG tablet TAKE ONE TABLET BY MOUTH THREE TIMES DAILY 270 tablet 0   Calcium  Citrate-Vitamin D  (CALCIUM  CITRATE + D PO) Take 1 tablet by mouth daily.     colestipol  (COLESTID ) 1 g tablet Take 1 tablet (1 g total) by mouth 2 (two) times daily. Take 2-4 hours apart from all  other medications 60 tablet 2   DULoxetine  (CYMBALTA ) 60 MG capsule Take 2 capsules (120 mg total) by mouth at bedtime. 60 capsule 2   famotidine  (PEPCID ) 20 MG tablet famotidine  20 mg tablet    Status: Active  Code System: RxNorm     fludrocortisone  (FLORINEF ) 0.1 MG tablet Take 0.2 mg by mouth daily.     fluticasone  (FLONASE ) 50 MCG/ACT nasal spray Place 1 spray into both nostrils daily as needed for allergies or rhinitis.     gabapentin  (NEURONTIN ) 300 MG capsule Take 3 capsules by mouth in the morning, 2 capsules in the afternoon and 2 capsules at night 630 capsule 0   loratadine (CLARITIN) 10 MG tablet Take by mouth.     Multiple Vitamin (MULTIVITAMIN WITH MINERALS) TABS tablet Take 1 tablet by mouth daily.     ondansetron  (ZOFRAN -ODT) 4 MG disintegrating tablet Take 1 tablet by mouth every 8 (eight) hours as needed for nausea/vomiting.     oxybutynin  (DITROPAN ) 5 MG tablet TAKE 1 TABLET TWICE A DAY 180 tablet 0   pantoprazole  (PROTONIX ) 40 MG tablet Take 1 tablet (40 mg total) by mouth daily. 90 tablet 0   topiramate (TOPAMAX) 50 MG tablet TAKE ONE TABLET BY MOUTH ONE TIME DAILY 90 tablet 0   sulfamethoxazole-trimethoprim (BACTRIM DS) 800-160 MG tablet Take 1 tablet by mouth 2 (two) times daily. (Patient not taking: Reported on 09/22/2024) 14 tablet 0   No facility-administered medications prior to visit.    Allergies  Allergen Reactions   Zolpidem Swelling    Tongue swelling, thrush symptoms.    Review of Systems  Constitutional:  Negative for chills and fever.  Respiratory:  Negative for shortness of breath.   Cardiovascular:  Negative for chest pain, palpitations and leg swelling.  Gastrointestinal:  Negative for abdominal pain, constipation, diarrhea, nausea and vomiting.  Genitourinary:  Negative for dysuria, frequency and urgency.  Musculoskeletal:  Positive for back pain, joint pain and myalgias.  Neurological:  Negative for dizziness and focal weakness.        Objective:    Physical Exam Constitutional:      General: She is not in acute distress.    Appearance: She is obese. She is not ill-appearing.  Eyes:     Extraocular Movements: Extraocular movements intact.     Conjunctiva/sclera: Conjunctivae normal.  Cardiovascular:     Rate and Rhythm: Normal rate.  Pulmonary:     Effort: Pulmonary effort is normal.  Musculoskeletal:     Cervical back: Normal range of motion and neck supple.     Right lower leg: No edema.     Left lower leg: No edema.  Skin:    General: Skin is warm and dry.  Neurological:     General: No focal deficit present.     Mental Status: She is alert and oriented to person, place, and time.     Motor: No weakness.     Coordination: Coordination normal.     Gait: Gait abnormal.  Psychiatric:        Mood and Affect: Mood normal.        Behavior: Behavior normal.        Thought Content: Thought content normal.      BP 112/74   Pulse 95   Temp 97.9 F (36.6 C) (Temporal)   Ht 5' 4 (1.626 m)   Wt 243 lb (110.2 kg)   SpO2 98%   BMI 41.71 kg/m  Wt Readings from Last 3 Encounters:  09/27/24 243 lb (110.2 kg)  09/22/24 246 lb (111.6 kg)  09/01/24 247 lb (112 kg)       Assessment & Plan:   Problem List Items Addressed This Visit     Acquired hypothyroidism   Fibromyalgia - Primary   Relevant Orders   Ambulatory referral to Pain Clinic   Urinary incontinence, stress and overactive   Other Visit Diagnoses       Chronic diarrhea         Adnexal mass         Aortic atherosclerosis         Bilateral leg pain       Relevant Orders   Ambulatory referral to Pain Clinic     Chronic pain of both hips       Relevant Orders   Ambulatory referral to Pain Clinic       Assessment and Plan Assessment & Plan Chronic diarrhea Recent colonoscopy showed polyps, which were removed, and no infection was found. Follow-up with GI specialist is ongoing. - Continue follow-up with GI specialist for chronic  diarrhea management.  Fibromyalgia with chronic pain in right hip and right leg Chronic pain in right hip and leg, exacerbated by standing and walking. Current management with duloxetine  and gabapentin  is insufficient. She prefers non-narcotic pain management options. - Referred to [facility] for evaluation and management of fibromyalgia and chronic pain. - Discuss leg and back pain with orthopedist during upcoming appointment.  Urinary incontinence Persists. Previous urology referral was not completed due to rescheduling. She is hesitant to attend appointments due to frequent diarrhea. - Call and schedule appointment with [facility] for urinary incontinence management.  Ovarian cyst (needs gynecology follow-up) Ovarian cyst identified, requiring follow-up with gynecologist. She has a gynecologist at [facility]  and plans to discuss findings with her. - Schedule appointment with gynecologist for evaluation of ovarian cyst.  Prediabetes Recent A1c test was normal, indicating improvement from previous borderline results. Dietary changes have been made, including reducing carbohydrate intake. - Continue dietary modifications to maintain normal glucose levels.  General Health Maintenance Immunizations are up to date, including flu shot, pneumonia, and shingles vaccines. Recent colonoscopy and mammogram results are normal. - Continue routine health maintenance and screenings.     I have discontinued Angela Mahoney's sulfamethoxazole-trimethoprim. I am also having her maintain her albuterol , ondansetron , Calcium  Citrate-Vitamin D  (CALCIUM  CITRATE + D PO), multivitamin with minerals, fluticasone , fludrocortisone , VITAMIN C-ROSE HIPS CR, loratadine, topiramate, b complex vitamins, Baclofen , DULoxetine , buPROPion , busPIRone , famotidine , colestipol , pantoprazole , oxybutynin , budesonide, gabapentin , and atorvastatin .  No orders of the defined types were placed in this  encounter.

## 2024-09-27 NOTE — Patient Instructions (Addendum)
 Please call to schedule with [facility] and Beverley Millman

## 2024-10-03 ENCOUNTER — Ambulatory Visit: Admitting: Psychology

## 2024-10-05 ENCOUNTER — Ambulatory Visit: Admitting: Physician Assistant

## 2024-10-05 NOTE — Progress Notes (Deleted)
 Ellouise Console, PA-C [address] [place], [place]  [address] Phone: [phone]   Primary Care Physician: Lendia Boby CROME, NP-C  Primary Gastroenterologist:  Ellouise Console, PA-C / Elspeth Naval, MD   Chief Complaint: Follow-up diarrhea and nonspecific colitis       HPI:   Discussed the use of AI scribe software for clinical note transcription with the patient, who gave verbal consent to proceed.  63 year old female returns for 6-week follow-up of diarrhea which started June 2025.  Colonoscopy by Dr. Naval 07/2024 showed acute nonspecific colitis.  Not diagnostic for microscopic colitis or IBD.  Abdominal pelvic CT enterography with contrast 08/2024 showed normal terminal ileum and no acute bowel inflammation.  Incidental 1.8 cm right ovarian cystIC lesion and she was referred to GYN for follow-up.    6 weeks ago she was started on empiric treatment with budesonide 9 mg daily x 2 weeks, then 6 mg daily x 2 weeks, then 3 mg daily for 4 weeks to treat colitis and diarrhea.  Also continued on Colestid  1 g twice daily.  History of Present Illness    GI HISTORY:  07/20/24 colonoscopy by Dr. Naval (for diarrhea and elevated fecal calprotectin): Fair prep.  Moderate lavaged with mostly adequate views.  1 small 5 mm tubular adenoma polyp removed.  Internal hemorrhoids.  Torturous colon.  Biopsies showed acute nonspecific colitis.  Biopsies showed no evidence of microscopic colitis.  No evidence of IBD.  5-year repeat colonoscopy (due 07/2029).   08/17/2024 abdominal pelvic CT enterography with contrast: 1. No acute inflammatory process identified within the abdomen or pelvis. No bowel obstruction. No suspicious lesion identified. 2. There is paucity of haustral markings mainly in the left hemicolon, which is nonspecific. There is no classic lead pipe appearance. Consider further evaluation with colonoscopy, as clinically indicated. The terminal ileum appears within  normal limits. 3. There is a well-circumscribed approximately 1.7 x 1.8 cm hypoattenuating structure in the right adnexa, which is incompletely characterized on the current exam but favored ovarian etiology. This may represent a small senescent ovarian cyst. Consider follow-up pelvic ultrasound in 1 year to document stability.   06/2024 abdominal x-ray: Normal.  No significant stool burden.   06/2024 fecal calprotectin elevated at 715.  Normal pancreatic elastase 266.  Celiac labs negative.   07/2024 repeat fecal calprotectin improved to 276.   05/17/2024 GI pathogen panel negative through PCP.  Patient states she had a negative screening Cologuard a few years ago in [facility].   02/2024 last labs: Normal CBC, CMP, TSH.   09/2017 screening colonoscopy in [place] : Excellent prep.  No polyps.  Small internal hemorrhoids.  Normal colon mucosa.  No biopsies.  Repeat in 10 years for screening (due 09/2027).   Takes pantoprazole  40 Mg and famotidine  10 Mg daily for GERD.   PMH: CAD, Hx MI, COPD, sleep apnea, GERD, hypothyroidism, fibromyalgia, depression, rheumatoid arthritis, chronic fatigue syndrome.  Prior appendectomy and bariatric surgery.  Had gastric sleeve surgery in 2017 in [place] .  Still has gallbladder.   Current Outpatient Medications  Medication Sig Dispense Refill   albuterol  (VENTOLIN  HFA) 108 (90 Base) MCG/ACT inhaler Inhale 1-2 puffs into the lungs every 6 (six) hours as needed for shortness of breath or wheezing.     Ascorbic Acid (VITAMIN C-ROSE HIPS CR) 1500 MG TBCR Take 1,500 mg by mouth daily.     atorvastatin  (LIPITOR ) 80 MG tablet Take 1 tablet (80 mg total) by mouth daily with supper.  90 tablet 0   b complex vitamins capsule Take 1 capsule by mouth daily.     Baclofen  5 MG TABS TAKE ONE TABLET BY MOUTH DAILY AT BEDTIME 90 tablet 0   budesonide (ENTOCORT EC) 3 MG 24 hr capsule Take three 3mg  capsules for 2 weeks (14 days) Take two 3mg  capsules for 2 weeks (14  days) Take one 3mg  capsule for 4 weeks (28 days) 98 capsule 1   buPROPion  (WELLBUTRIN  XL) 150 MG 24 hr tablet Take 1 tablet (150 mg total) by mouth every morning. 90 tablet 1   busPIRone  (BUSPAR ) 15 MG tablet TAKE ONE TABLET BY MOUTH THREE TIMES DAILY 270 tablet 0   Calcium  Citrate-Vitamin D  (CALCIUM  CITRATE + D PO) Take 1 tablet by mouth daily.     colestipol  (COLESTID ) 1 g tablet Take 1 tablet (1 g total) by mouth 2 (two) times daily. Take 2-4 hours apart from all other medications 60 tablet 2   DULoxetine  (CYMBALTA ) 60 MG capsule Take 2 capsules (120 mg total) by mouth at bedtime. 60 capsule 2   famotidine  (PEPCID ) 20 MG tablet famotidine  20 mg tablet    Status: Active  Code System: RxNorm     fludrocortisone  (FLORINEF ) 0.1 MG tablet Take 0.2 mg by mouth daily.     fluticasone  (FLONASE ) 50 MCG/ACT nasal spray Place 1 spray into both nostrils daily as needed for allergies or rhinitis.     gabapentin  (NEURONTIN ) 300 MG capsule Take 3 capsules by mouth in the morning, 2 capsules in the afternoon and 2 capsules at night 630 capsule 0   loratadine (CLARITIN) 10 MG tablet Take by mouth.     Multiple Vitamin (MULTIVITAMIN WITH MINERALS) TABS tablet Take 1 tablet by mouth daily.     ondansetron  (ZOFRAN -ODT) 4 MG disintegrating tablet Take 1 tablet by mouth every 8 (eight) hours as needed for nausea/vomiting.     oxybutynin  (DITROPAN ) 5 MG tablet TAKE 1 TABLET TWICE A DAY 180 tablet 0   pantoprazole  (PROTONIX ) 40 MG tablet Take 1 tablet (40 mg total) by mouth daily. 90 tablet 0   topiramate (TOPAMAX) 50 MG tablet TAKE ONE TABLET BY MOUTH ONE TIME DAILY 90 tablet 0   No current facility-administered medications for this visit.    Allergies as of 10/05/2024 - Review Complete 09/27/2024  Allergen Reaction Noted   Zolpidem Swelling 04/04/2020    Past Medical History:  Diagnosis Date   Anemia    Anxiety    Arthritis    Back pain    Bilateral swelling of feet    Chest pain    Chronic fatigue  syndrome    COPD (chronic obstructive pulmonary disease) (HCC) 2017   Coronary artery disease    Ozell Kubas, PA   Depression    Dyspnea    with exertion   Falls    Fibromyalgia    GERD (gastroesophageal reflux disease)    Headache    High cholesterol    History of appendectomy 10/26/2022   History of fainting spells of unknown cause    ? 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/01/2023   Prediabetes 10/06/2023   Rheumatoid arthritis (HCC)    Seasonal allergies    Sleep apnea    does not use cpap   SOB (shortness of  breath)    Vitamin D  deficiency     Past Surgical History:  Procedure Laterality Date   BUNIONECTOMY Right 2002   CARDIAC CATHETERIZATION  01/23/2016   In [place]   COLONOSCOPY     x 2 at ages 1yr and 89yr - [place]   EXTERNAL FIXATION REMOVAL Right 08/06/2021   Procedure: REMOVAL EXTERNAL FIXATION LEG;  Surgeon: Kendal Franky SQUIBB, MD;  Location: [facility];  Service: Orthopedics;  Laterality: Right;  zimmer biomet ex-fix removal   LAPAROSCOPIC GASTRIC SLEEVE RESECTION  2017   [place]   MULTIPLE TOOTH EXTRACTIONS     has upper and lower dentures   ORIF ANKLE FRACTURE Right 08/06/2021   Procedure: OPEN REDUCTION INTERNAL FIXATION (ORIF) ANKLE FRACTURE;  Surgeon: Kendal Franky SQUIBB, MD;  Location: [facility];  Service: Orthopedics;  Laterality: Right;  smith and nephew   SYNDESMOSIS REPAIR Right 12/15/2021   Procedure: SYNDESMOSIS REPAIR;  Surgeon: Kendal Franky SQUIBB, MD;  Location: [facility] OR;  Service: Orthopedics;  Laterality: Right;    Review of Systems:    All systems reviewed and negative except where noted in HPI.    Physical Exam:  There were no vitals taken for this visit. No LMP recorded. Patient is postmenopausal.  General: Well-nourished, well-developed in no acute distress.   Lungs: Clear to auscultation bilaterally. Non-labored. Heart: Regular rate and rhythm, no murmurs rubs or gallops.  Abdomen: Bowel sounds are normal; Abdomen is Soft; No hepatosplenomegaly, masses or hernias;  No Abdominal Tenderness; No guarding or rebound tenderness. Neuro: Alert and oriented x 3.  Grossly intact.  Psych: Alert and cooperative, normal mood and affect.   Imaging Studies: No results found.  Labs: CBC    Component Value Date/Time   WBC 10.5 09/01/2024 1152   RBC 4.63 09/01/2024 1152   HGB 13.4 09/01/2024 1152   HGB 13.4 08/31/2023 0929   HCT 42.1 09/01/2024 1152   HCT 42.0 08/31/2023 0929   PLT 200.0 09/01/2024 1152   PLT 182 08/31/2023 0929   MCV 90.8 09/01/2024 1152   MCV 90 08/31/2023 0929   MCH 28.7 08/31/2023 0929   MCH 28.5 12/15/2021 0559   MCHC 31.7 09/01/2024 1152   RDW 16.1 (H) 09/01/2024 1152   RDW 13.3 08/31/2023 0929   LYMPHSABS 0.9 09/01/2024 1152   LYMPHSABS 1.3 08/31/2023 0929   MONOABS 0.9 09/01/2024 1152   EOSABS 0.0 09/01/2024 1152   EOSABS 0.2 08/31/2023 0929   BASOSABS 0.0 09/01/2024 1152   BASOSABS 0.1 08/31/2023 0929    CMP     Component Value Date/Time   NA 138 09/01/2024 1152   NA 144 08/31/2023 0929   K 4.2 09/01/2024 1152   CL 103 09/01/2024 1152   CO2 27 09/01/2024 1152   GLUCOSE 117 (H) 09/01/2024 1152   BUN 13 09/01/2024 1152   BUN 11 08/31/2023 0929   CREATININE 1.02 09/01/2024 1152   CALCIUM  9.4 09/01/2024 1152   PROT 7.4 09/01/2024 1152   PROT 6.6 08/31/2023 0929   ALBUMIN 4.5 09/01/2024 1152   ALBUMIN 4.3 08/31/2023 0929   AST 25 09/01/2024 1152   ALT 23 09/01/2024 1152   ALKPHOS 68 09/01/2024 1152   BILITOT 0.7 09/01/2024 1152   BILITOT 0.3 08/31/2023 0929   GFRNONAA >60 12/15/2021 0559   GFRAA >60 04/03/2020 2209       Assessment and Plan:   Lisette Mancebo is a 63 y.o. y/o female   1.  Chronic diarrhea; nonspecific colitis; recently treated with budesonide - Continue  budesonide  taper - Continue Colestid   2.  Right ovarian cyst - Recommend follow-up with GYN  3.  History of adenomatous colon polyps - 5-year repeat colonoscopy due 07/2029   Assessment and Plan Assessment & Plan       Ellouise Console, PA-C  Follow up ***

## 2024-10-08 ENCOUNTER — Other Ambulatory Visit: Payer: Self-pay | Admitting: Family Medicine

## 2024-10-17 ENCOUNTER — Ambulatory Visit: Admitting: Psychology

## 2024-10-17 DIAGNOSIS — F4010 Social phobia, unspecified: Secondary | ICD-10-CM | POA: Diagnosis not present

## 2024-10-17 DIAGNOSIS — F331 Major depressive disorder, recurrent, moderate: Secondary | ICD-10-CM

## 2024-10-17 NOTE — Progress Notes (Signed)
 [facility] Counselor/Therapist Progress Note  Patient ID: Angela Mahoney, MRN: [id number],    Date: 10/17/2024  Time Spent: 9:02am-9:48am   Pt is seen for a virtual video visit via caregility.    Pt joins from her home, reporting privacy, and counselor from her home office.  Pt consents of virtual visits and is aware of limitation so such visits.  Treatment Type: Individual Therapy  Reported Symptoms: pt reports positive interactions w/ daughter and conflicts resolved.  Mental Status Exam: Appearance:  Well Groomed     Behavior: Appropriate  Motor: Normal  Speech/Language:  Clear and Coherent  Affect: Appropriate  Mood: anxious  Thought process: normal  Thought content:   WNL  Sensory/Perceptual disturbances:   WNL  Orientation: oriented to person, place, time/date, and situation  Attention: Good  Concentration: Good  Memory: WNL  Fund of knowledge:  Good  Insight:   Good  Judgment:  Good  Impulse Control: Good   Risk Assessment: Danger to Self:  No Self-injurious Behavior: No Danger to Others: No Duty to Warn:no Physical Aggression / Violence:No  Access to Firearms a concern: No  Gang Involvement:No   Subjective: Counselor assessed pt current functioning per pt report.  Processed w/ pt interactions and emotions.  Explored recent communication w/ daughter and reflected how not escalating things or resolving her daughter's feelings.  Discussed positive outcomes.   Explored upcoming holidays and plans.  Pt affect wnl.  Pt reports mood has been ok.  Pt reports has upcoming doctor appts and still dealing w/ unknown of her gastro issues not resolved.  Pt reports her daughter made couple passive aggressive comments about not coming to [place] laws for thanksgiving and felt her daughter was disappointed.  Pt reports she was able to not argue back and that managed through w/out escalating conflict.  Pt reports that she enjoyed her holiday w/ the neighbors.  Pt  reports she is planning ot join her daughter at the in laws for Christmas.  Pt reports identified someone to help w/ projects.    Interventions: Cognitive Behavioral Therapy, Assertiveness/Communication, and supportive  Diagnosis:Major depressive disorder, recurrent episode, moderate (HCC)  Social anxiety disorder  Plan: Pt to f/u in 2 week w/ counseling.  Pt to f/u as scheduled w/ PCP and specialist as scheduled.     ndividualized Treatment Plan Strengths: seeking counseling.  Pt enjoys crocheting.   Supports: husband and daughter   Goal/Needs for Treatment:  In order of importance to patient 1) coping w/ depression and anxiety 2) work on grief related to losses.   3) --   Client Statement of Needs: I need to work though stressors and losses- My daughter's stuff, my mom's stuff, my grief w/ my mom, Work on my stuff with him (husband) and losing him.   Treatment Level:outpatient counseling  Symptoms:depression, anxiety, worry, fatigue, grief, sleep disturbance  Client Treatment Preferences:Weekly to biweekly counseling.   Healthcare consumer's goal for treatment:  Counselor, Damien Herald, [facility] will support the patient's ability to achieve the goals identified. Cognitive Behavioral Therapy, Assertive Communication/Conflict Resolution Training, Relaxation Training, ACT, Humanistic and other evidenced-based practices will be used to promote progress towards healthy functioning.   Healthcare consumer will: Actively participate in therapy, working towards healthy functioning.    *Justification for Continuation/Discontinuation of Goal: R=Revised, O=Ongoing, A=Achieved, D=Discontinued  Goal 1) Manage depression and anxiety, reframing related thoughts, self care and behavioral management AEB pt report of decreased symptoms and therapist observation.  Baseline date 03/16/24:  Progress towards goal 0; How Often - Daily Target Date Goal Was reviewed Status Code Progress towards goal/Likert  rating  03/16/25                Goal 2) Increase effective coping w/ losses and grief through effective expression of feelings AEB pt verbalizing in session and therapist observation.  Baseline date 03/16/24: Progress towards goal 0/; How Often - Daily Target Date Goal Was reviewed Status Code Progress towards goal  03/16/25                 This plan has been reviewed and created by the following participants:  This plan will be reviewed at least every 12 months. Date Behavioral Health Clinician Date Guardian/Patient   03/16/24 [facility] Barbarann [facility] 03/16/24 Verbal Consent Provided and signature on file            [place], [facility]

## 2024-10-31 ENCOUNTER — Ambulatory Visit: Admitting: Psychology

## 2024-10-31 DIAGNOSIS — F4010 Social phobia, unspecified: Secondary | ICD-10-CM

## 2024-10-31 DIAGNOSIS — F331 Major depressive disorder, recurrent, moderate: Secondary | ICD-10-CM

## 2024-10-31 NOTE — Progress Notes (Unsigned)
 [facility] Counselor/Therapist Progress Note  Patient ID: Angela Mahoney, MRN: [id number],    Date: 10/31/2024  Time Spent: 9:04am-9:57am   Pt is seen for a virtual video visit via caregility.    Pt joins from her home, reporting privacy, and counselor from her home office.  Pt consents of virtual visits and is aware of limitation so such visits.  Treatment Type: Individual Therapy  Reported Symptoms: pt reports positive steps taken for self and connecting.  Mental Status Exam: Appearance:  Well Groomed     Behavior: Appropriate  Motor: Normal  Speech/Language:  Clear and Coherent  Affect: Appropriate  Mood: anxious  Thought process: normal  Thought content:   WNL  Sensory/Perceptual disturbances:   WNL  Orientation: oriented to person, place, time/date, and situation  Attention: Good  Concentration: Good  Memory: WNL  Fund of knowledge:  Good  Insight:   Good  Judgment:  Good  Impulse Control: Good   Risk Assessment: Danger to Self:  No Self-injurious Behavior: No Danger to Others: No Duty to Warn:no Physical Aggression / Violence:No  Access to Firearms a concern: No  Gang Involvement:No   Subjective: Counselor assessed pt current functioning per pt report.  Processed w/ pt positives, stressors and emotions.  Explored recent interactions w/ daughter and plans for holidays.  Discussed steps pt is taking for self towards connecting outside of house.   Pt affect wnl.  Pt reports mood has been ok.  Pt reports has been attending to her medical appointments- still dealing w/ gastro issues.  Pt reports positive interactions w/ daughter and getting nails done.  Pt reports currently only plan to go to daughters in [place] for Christmas and attend church christmas eve.  Pt feels good about connecting w/ local church for online services and plans to start w/ bible study in new year.  Pt wants to increase getting out of house.    Interventions: Cognitive  Behavioral Therapy, Assertiveness/Communication, and supportive  Diagnosis:Major depressive disorder, recurrent episode, moderate (HCC)  Social anxiety disorder  Plan: Pt to f/u in 2 week w/ counseling.  Pt to f/u as scheduled w/ PCP and specialist as scheduled.     ndividualized Treatment Plan Strengths: seeking counseling.  Pt enjoys crocheting.   Supports: husband and daughter   Goal/Needs for Treatment:  In order of importance to patient 1) coping w/ depression and anxiety 2) work on grief related to losses.   3) --   Client Statement of Needs: I need to work though stressors and losses- My daughter's stuff, my mom's stuff, my grief w/ my mom, Work on my stuff with him (husband) and losing him.   Treatment Level:outpatient counseling  Symptoms:depression, anxiety, worry, fatigue, grief, sleep disturbance  Client Treatment Preferences:Weekly to biweekly counseling.   Healthcare consumer's goal for treatment:  Counselor, Damien Herald, [facility] will support the patient's ability to achieve the goals identified. Cognitive Behavioral Therapy, Assertive Communication/Conflict Resolution Training, Relaxation Training, ACT, Humanistic and other evidenced-based practices will be used to promote progress towards healthy functioning.   Healthcare consumer will: Actively participate in therapy, working towards healthy functioning.    *Justification for Continuation/Discontinuation of Goal: R=Revised, O=Ongoing, A=Achieved, D=Discontinued  Goal 1) Manage depression and anxiety, reframing related thoughts, self care and behavioral management AEB pt report of decreased symptoms and therapist observation.  Baseline date 03/16/24: Progress towards goal 0; How Often - Daily Target Date Goal Was reviewed Status Code Progress towards goal/Likert rating  03/16/25  Goal 2) Increase effective coping w/ losses and grief through effective expression of feelings AEB pt verbalizing in  session and therapist observation.  Baseline date 03/16/24: Progress towards goal 0/; How Often - Daily Target Date Goal Was reviewed Status Code Progress towards goal  03/16/25                 This plan has been reviewed and created by the following participants:  This plan will be reviewed at least every 12 months. Date Behavioral Health Clinician Date Guardian/Patient   03/16/24 [facility]ann [facility] 03/16/24 Verbal Consent Provided and signature on file            [place], [facility]

## 2024-11-11 ENCOUNTER — Other Ambulatory Visit: Payer: Self-pay | Admitting: Family Medicine

## 2024-11-11 DIAGNOSIS — F419 Anxiety disorder, unspecified: Secondary | ICD-10-CM

## 2024-11-11 DIAGNOSIS — K219 Gastro-esophageal reflux disease without esophagitis: Secondary | ICD-10-CM

## 2024-11-11 DIAGNOSIS — E7849 Other hyperlipidemia: Secondary | ICD-10-CM

## 2024-11-13 ENCOUNTER — Other Ambulatory Visit: Payer: Self-pay | Admitting: Family Medicine

## 2024-11-13 ENCOUNTER — Encounter: Payer: Self-pay | Admitting: Physical Medicine & Rehabilitation

## 2024-11-13 ENCOUNTER — Telehealth: Payer: Self-pay

## 2024-11-13 DIAGNOSIS — K219 Gastro-esophageal reflux disease without esophagitis: Secondary | ICD-10-CM

## 2024-11-13 NOTE — Telephone Encounter (Signed)
 Copied from CRM [id number]. Topic: General - Other >> Nov 13, 2024 10:43 AM Antony RAMAN wrote: Reason for CRM: patient is wanting to know if Dr Lendia will do her paper work for the [facility] for the disability plaque or will the pain clinic she was referred to sign it? Please advise [phone]

## 2024-11-14 NOTE — Telephone Encounter (Signed)
 Called and let pt know we are glad to fill it out for her, pt states she will bring the recert paper over for us .

## 2024-11-17 ENCOUNTER — Telehealth: Payer: Self-pay

## 2024-11-17 ENCOUNTER — Other Ambulatory Visit: Payer: Self-pay | Admitting: Family Medicine

## 2024-11-17 DIAGNOSIS — K219 Gastro-esophageal reflux disease without esophagitis: Secondary | ICD-10-CM

## 2024-11-17 MED ORDER — PANTOPRAZOLE SODIUM 40 MG PO TBEC: 40.0000 mg | DELAYED_RELEASE_TABLET | Freq: Every day | ORAL | 0 refills | Status: DC

## 2024-11-17 NOTE — Telephone Encounter (Signed)
 Copied from CRM [id number]. Topic: Clinical - Prescription Issue >> Nov 17, 2024  2:30 PM Brittany M wrote: Reason for CRM: [facility] pharmacy calling stating they did not receive request for pantoprazole  (PROTONIX ) 40 MG tablet

## 2024-11-17 NOTE — Telephone Encounter (Signed)
 Rx resent.

## 2024-11-19 NOTE — Progress Notes (Unsigned)
" °  Cardiology Office Note:  .   Date:  11/19/2024  ID:  Angela Mahoney, DOB [birth year], MRN [id number] PCP: Lendia Boby CROME, NP-C  [facility] Providers Cardiologist:  None   History of Present Illness: .   No chief complaint on file.   Angela Mahoney is a 64 y.o. female with below history who presents for the evaluation of hypotension at the request of Goukassian, Ilona, DO.  T chol 148, HDL 78, LDL 49, TG 105 TSH 2.29 A1c 5.3   History of Present Illness               Problem List Anxiety Fibromyalgia     ROS: All other ROS reviewed and negative. Pertinent positives noted in the HPI.     Studies Reviewed: SABRA       CCTA 06/07/2024 IMPRESSION:  1. Minimal, non-obstructive coronary atherosclerosis. CAD-RADS score 1.   Coronary artery calcium  score is 0 . Recommend heart healthy lifestyle with behavioral and medical risk factor modification.  2.  Gastric surgical clip present. Distal esophageal thickening and patulous esophagus.  Clinical correlation advised.  Physical Exam:   VS:  There were no vitals taken for this visit.   Wt Readings from Last 3 Encounters:  09/27/24 243 lb (110.2 kg)  09/22/24 246 lb (111.6 kg)  09/01/24 247 lb (112 kg)    GEN: Well nourished, well developed in no acute distress NECK: No JVD; No carotid bruits CARDIAC: ***RRR, no murmurs, rubs, gallops RESPIRATORY:  Clear to auscultation without rales, wheezing or rhonchi  ABDOMEN: Soft, non-tender, non-distended EXTREMITIES:  No edema; No deformity  ASSESSMENT AND PLAN: .   Assessment and Plan                 {Are you ordering a CV Procedure (e.g. stress test, cath, DCCV, TEE, etc)?   Press F2        :[id number]}   Follow-up: No follow-ups on file.  Signed, Darryle DASEN. Barbaraann, MD, [facility]  [facility]  [address] [place], [place] [address] [phone]  9:30 PM   "

## 2024-11-22 ENCOUNTER — Ambulatory Visit: Admitting: Cardiovascular Disease

## 2024-11-22 NOTE — Progress Notes (Signed)
 " Cardiology Office Note:  .   Date:  11/27/2024  ID:  Barnie Jenkins Crick, DOB Sep 18, 1961, MRN [id number] PCP: Lendia Boby CROME, NP-C  [facility] Providers Cardiologist:  None   History of Present Illness: .    Chief Complaint  Patient presents with   Hypotension    Angela Mahoney is a 64 y.o. female with below history who presents for the evaluation of hypotension at the request of Goukassian, Ilona, DO.   TSH 2.29 A1c 5.3   History of Present Illness   Angela Mahoney is a 64 year old female with orthostatic hypotension who presents for evaluation of her condition. She was referred by Dr. Tan for evaluation of her orthostatic hypotension.  She has a history of orthostatic hypotension with symptoms of dizziness, lightheadedness, and frequent falls. Her blood pressure has been difficult to maintain above 100 mmHg, even with fludrocortisone  0.2 mg daily.  She has fibromyalgia, diagnosed in 2017, which coincided with the onset of her cardiac issues. Her medications for fibromyalgia include Cymbalta , Topamax, oxybutynin , gabapentin , and baclofen , all of which can contribute to lowering her blood pressure. She emphasizes the necessity of these medications for her functioning despite their side effects.  In 2017, she underwent a cardiac catheterization in [place] , revealing minimal coronary artery disease with blockages less than 40%, not requiring stenting. She reports that a recent coronary CT angiogram in July was performed, and her cardiologist told her that her coronary arteries had gotten better. She has been under cardiology care since 1978 and continues to see a cardiologist after moving to her current location four years ago.  Her past medical history includes hyperlipidemia, managed with Lipitor  80 mg. She reports a history of high cholesterol, which was severe in the past, but her recent lipid levels are at goal. No history of heart attack or  stroke.  She is retired due to fibromyalgia and has been granted full disability since 2018. She does not smoke and consumes alcohol  minimally. She has a grown child and is a grandmother. She is cautious about using narcotics and only uses them post-surgery, specifically Vicodin, and avoids other pain medications due to potential interactions with her current medications.           Problem List Anxiety Fibromyalgia  HLD -T chol 148, HDL 78, LDL 49, TG 105 Minimal CAD Orthostatic hypotension -2/2 meds     ROS: All other ROS reviewed and negative. Pertinent positives noted in the HPI.     Studies Reviewed: SABRA   EKG Interpretation Date/Time:  Monday November 27 2024 14:16:50 EST Ventricular Rate:  71 PR Interval:  190 QRS Duration:  86 QT Interval:  390 QTC Calculation: 423 R Axis:   73  Text Interpretation: Normal sinus rhythm Normal ECG Confirmed by Barbaraann Kotyk [phone]) on 11/27/2024 2:29:19 PM   CCTA 06/07/2024 IMPRESSION:  1. Minimal, non-obstructive coronary atherosclerosis. CAD-RADS score 1.   Coronary artery calcium  score is 0 . Recommend heart healthy lifestyle with behavioral and medical risk factor modification.  2.  Gastric surgical clip present. Distal esophageal thickening and patulous esophagus.  Clinical correlation advised.  Physical Exam:   VS:  BP 122/86   Pulse 75   Ht 5' 3 (1.6 m)   Wt 244 lb 9.6 oz (110.9 kg)   SpO2 96%   BMI 43.33 kg/m    Wt Readings from Last 3 Encounters:  11/27/24 244 lb 9.6 oz (110.9 kg)  11/24/24 243 lb 6 oz (  110.4 kg)  09/27/24 243 lb (110.2 kg)    GEN: Well nourished, well developed in no acute distress NECK: No JVD; No carotid bruits CARDIAC: RRR, no murmurs, rubs, gallops RESPIRATORY:  Clear to auscultation without rales, wheezing or rhonchi  ABDOMEN: Soft, non-tender, non-distended EXTREMITIES:  No edema; No deformity  ASSESSMENT AND PLAN: .   Assessment and Plan    Orthostatic hypotension Chronic orthostatic  hypotension exacerbated by medications. No POTS. Blood pressure management complicated by fibromyalgia treatment (gabapentin , topiramate, oxybutynin , gabapentin , duloxetine ). Fludrocortisone  used for symptom management. - Refilled fludrocortisone  0.2 mg daily. - Ordered echocardiogram for baseline. - Advised caution with narcotics due to interactions.  Hyperlipidemia LDL at goal with Lipitor  80 mg. - Continue Lipitor  80 mg daily.  Fibromyalgia Severe fibromyalgia causing functional impairment. Managed with medications affecting blood pressure. Referred for non-pharmacological management. On gabapentin , topiramate, oxybutynin , gabapentin , duloxetine . - Referred to pain clinic for non-pharmacological management. - Advised caution with narcotics.                Follow-up: Return in about 1 year (around 11/27/2025).  Signed, Darryle DASEN. Barbaraann, MD, [facility]  [facility]  [address] [place], [place] [address] [phone]  2:42 PM   "

## 2024-11-23 ENCOUNTER — Other Ambulatory Visit: Payer: Self-pay | Admitting: Family Medicine

## 2024-11-23 ENCOUNTER — Ambulatory Visit: Admitting: Psychology

## 2024-11-23 ENCOUNTER — Ambulatory Visit: Admitting: Cardiovascular Disease

## 2024-11-23 DIAGNOSIS — R42 Dizziness and giddiness: Secondary | ICD-10-CM

## 2024-11-23 DIAGNOSIS — F4010 Social phobia, unspecified: Secondary | ICD-10-CM | POA: Diagnosis not present

## 2024-11-23 DIAGNOSIS — F331 Major depressive disorder, recurrent, moderate: Secondary | ICD-10-CM | POA: Diagnosis not present

## 2024-11-23 DIAGNOSIS — I959 Hypotension, unspecified: Secondary | ICD-10-CM

## 2024-11-23 NOTE — Progress Notes (Signed)
 "    [facility] Counselor/Therapist Progress Note  Patient ID: Angela Mahoney, MRN: [id number],    Date: 11/23/2024  Time Spent: 11:00am-11:56am   Pt is seen for an in person visit.  Treatment Type: Individual Therapy  Reported Symptoms: pt reports holidays positive.  Pt reports some fear of rejection.  Mental Status Exam: Appearance:  Well Groomed     Behavior: Appropriate  Motor: Normal  Speech/Language:  Clear and Coherent  Affect: Appropriate  Mood: anxious  Thought process: normal  Thought content:   WNL  Sensory/Perceptual disturbances:   WNL  Orientation: oriented to person, place, time/date, and situation  Attention: Good  Concentration: Good  Memory: WNL  Fund of knowledge:  Good  Insight:   Good  Judgment:  Good  Impulse Control: Good   Risk Assessment: Danger to Self:  No Self-injurious Behavior: No Danger to Others: No Duty to Warn:no Physical Aggression / Violence:No  Access to Firearms a concern: No  Gang Involvement:No   Subjective: Counselor assessed pt current functioning per pt report.  Processed w/ pt positives, stressors and interactions.  Explored holidays and positive family interactions and engagement w/ her faith community.  Discussed pt fear of rejection from daughter and related distortions- assisted challenging w/ facts and reframing.  Reflected ways connected.   Pt affect wnl.  Pt reports holidays were good.  Pt reports positive interactions w/ her daughter and family.  Pt reports that enjoyed christmas eve service and that daughter and granddaughter joined.  Pt discussed how daughter didn't invite to concern w/ granddaughter and feels left out and wants to ask to join but fears rejection.  Pt is able to acknowledge no doesn't mean rejection of their relationships and able to reflect in ways connected.  Pt plans to learn about bible study opportunities w/ church upcoming.   Interventions: Cognitive Behavioral Therapy,  Assertiveness/Communication, and supportive  Diagnosis:Major depressive disorder, recurrent episode, moderate (HCC)  Social anxiety disorder  Plan: Pt to f/u in 2 week w/ counseling.  Pt to f/u as scheduled w/ PCP and specialist as scheduled.     ndividualized Treatment Plan Strengths: seeking counseling.  Pt enjoys crocheting.   Supports: husband and daughter   Goal/Needs for Treatment:  In order of importance to patient 1) coping w/ depression and anxiety 2) work on grief related to losses.   3) --   Client Statement of Needs: I need to work though stressors and losses- My daughter's stuff, my mom's stuff, my grief w/ my mom, Work on my stuff with him (husband) and losing him.   Treatment Level:outpatient counseling  Symptoms:depression, anxiety, worry, fatigue, grief, sleep disturbance  Client Treatment Preferences:Weekly to biweekly counseling.   Healthcare consumer's goal for treatment:  Counselor, Damien Herald, [facility] will support the patient's ability to achieve the goals identified. Cognitive Behavioral Therapy, Assertive Communication/Conflict Resolution Training, Relaxation Training, ACT, Humanistic and other evidenced-based practices will be used to promote progress towards healthy functioning.   Healthcare consumer will: Actively participate in therapy, working towards healthy functioning.    *Justification for Continuation/Discontinuation of Goal: R=Revised, O=Ongoing, A=Achieved, D=Discontinued  Goal 1) Manage depression and anxiety, reframing related thoughts, self care and behavioral management AEB pt report of decreased symptoms and therapist observation.  Baseline date 03/16/24: Progress towards goal 0; How Often - Daily Target Date Goal Was reviewed Status Code Progress towards goal/Likert rating  03/16/25                Goal 2)  Increase effective coping w/ losses and grief through effective expression of feelings AEB pt verbalizing in session and therapist  observation.  Baseline date 03/16/24: Progress towards goal 0/; How Often - Daily Target Date Goal Was reviewed Status Code Progress towards goal  03/16/25                 This plan has been reviewed and created by the following participants:  This plan will be reviewed at least every 12 months. Date Behavioral Health Clinician Date Guardian/Patient   03/16/24 [facility] Barbarann [facility] 03/16/24 Verbal Consent Provided and signature on file             [place], [facility] "

## 2024-11-23 NOTE — Progress Notes (Signed)
 "     Angela Console, PA-C [address] [place], [place]  [address] Phone: [phone]   Primary Care Physician: Lendia Boby CROME, NP-C  Primary Gastroenterologist:  Angela Console, PA-C / Elspeth Naval, MD   Chief Complaint: Follow-up diarrhea and colitis       HPI:   Discussed the use of AI scribe software for clinical note transcription with the patient, who gave verbal consent to proceed.  I last saw patient 08/18/2024 to follow-up diarrhea and colitis with elevated fecal calprotectin.  Repeat fecal calprotectin improved.  She tried and failed Imodium.  Colestid  1 g twice daily helped mildly.  3 months ago she was given trial of budesonide  and 3 mg 3 capsules daily for 2 weeks, then 2 capsules daily for 2 weeks, then 1 capsule daily which did not help.  She is off budesonide .  Continues taking Colestid  1 g twice daily.  Of note, she is not a good historian.  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  Diarrhea and Colitis Symptoms: - Persistent diarrhea since at least September 2025 - Colonoscopy with biopsies in September 2025 did not show microscopic colitis.  Biopsy showed nonspecific colitis. - Budesonide  (one tablet four times daily for four weeks) provided no symptom relief -Currently taking colestipol  (one tablet twice daily)  - Not currently taking fiber supplements - Since mid-December 2025, bowel movement frequency decreased to two to three days per week - On bowel movement days, experiences multiple bowel movement episodes: first stool in the morning is formed, followed by loose or watery stools within a few hours. - NG has no bowel movement for 2 to 3 days. - Intestinal cramping occurs prior to bowel movements (2 to 3 days/week) - No blood in stool; checks each time - Weight stable, fluctuating by about a pound.  No  unintentional weight loss. - No nausea  Medication Effects on Bowel Function: - Ditropan  XL 5 mg twice daily started for urinary incontinence after urology visit - Uncertain if Ditropan  XL or other medications contributed to bowel changes; bowel movements were normal prior to starting Ditropan  XL  Dietary Factors: - Diarrhea episodes may be related to dietary intake - Diarrhea occurs if meals are skipped or if only ice cream is consumed for a couple of days - Uses 1% milk in coffee; does not use lactaid or almond milk - Uncertain which fruits and vegetables are appropriate for her diet  Gastroesophageal Reflux Disease: - Pantoprazole  40 mg daily started for acid reflux; famotidine  used as needed - Sometimes takes both, with pantoprazole  in the morning and famotidine  in the evening, depending on symptoms and cost - Acid reflux symptoms have improved; no significant heartburn at this time  07/20/24 colonoscopy by Dr. Naval (for diarrhea and elevated fecal calprotectin): Fair prep.  Moderate lavaged with mostly adequate views.  1 small 5 mm tubular adenoma polyp removed.  Internal hemorrhoids.  Torturous colon.  Biopsies showed acute nonspecific colitis.  Biopsies showed no evidence of microscopic colitis.  No evidence of IBD.  5-year repeat colonoscopy (due 07/2029).   08/17/2024 abdominal pelvic CT enterography with contrast: 1. No acute inflammatory process identified within the abdomen or pelvis. No bowel obstruction. No suspicious lesion identified. 2. There is paucity of haustral markings mainly in the left hemicolon, which is nonspecific. There is no classic lead pipe appearance. Consider further evaluation with  colonoscopy, as clinically indicated. The terminal ileum appears within normal limits. 3. There is a well-circumscribed approximately 1.7 x 1.8 cm hypoattenuating structure in the right adnexa, which is incompletely characterized on the current exam but favored ovarian  etiology. This may represent a small senescent ovarian cyst. Consider follow-up pelvic ultrasound in 1 year to document stability.   06/2024 abdominal x-ray: Normal.  No significant stool burden.   06/2024 fecal calprotectin elevated at 715.  Normal pancreatic elastase 266.  Celiac labs negative.   07/2024 repeat fecal calprotectin improved to 276.   05/17/2024 GI pathogen panel negative through PCP.  Patient states she had a negative screening Cologuard a few years ago in [facility].   02/2024 labs: Normal CBC, CMP, TSH.   09/2017 screening colonoscopy in [place] : Excellent prep.  No polyps.  Small internal hemorrhoids.  Normal colon mucosa.  No biopsies.  Repeat in 10 years for screening (due 09/2027).   Takes pantoprazole  40 Mg and famotidine  10 Mg daily for GERD.   PMH: CAD, Hx MI, COPD, sleep apnea, GERD, hypothyroidism, fibromyalgia, depression, rheumatoid arthritis, chronic fatigue syndrome.  Prior appendectomy and bariatric surgery.  Had gastric sleeve surgery in 2017 in [place] .  Still has gallbladder.     Current Outpatient Medications  Medication Sig Dispense Refill   albuterol  (VENTOLIN  HFA) 108 (90 Base) MCG/ACT inhaler Inhale 1-2 puffs into the lungs every 6 (six) hours as needed for shortness of breath or wheezing.     Ascorbic Acid (VITAMIN C-ROSE HIPS CR) 1500 MG TBCR Take 1,500 mg by mouth daily.     atorvastatin  (LIPITOR ) 80 MG tablet Take 1 tablet (80 mg total) by mouth daily with supper. 90 tablet 0   b complex vitamins capsule Take 1 capsule by mouth daily.     Baclofen  5 MG TABS TAKE ONE TABLET BY MOUTH DAILY AT BEDTIME 90 tablet 0   buPROPion  (WELLBUTRIN  XL) 150 MG 24 hr tablet Take 1 tablet (150 mg total) by mouth every morning 90 tablet 0   busPIRone  (BUSPAR ) 15 MG tablet TAKE ONE TABLET BY MOUTH THREE TIMES DAILY 270 tablet 0   Calcium  Citrate-Vitamin D  (CALCIUM  CITRATE + D PO) Take 1 tablet by mouth daily.     DULoxetine  (CYMBALTA ) 60 MG capsule Take 2  capsules (120 mg total) by mouth at bedtime. 60 capsule 2   fludrocortisone  (FLORINEF ) 0.1 MG tablet Take 0.2 mg by mouth daily.     gabapentin  (NEURONTIN ) 300 MG capsule Take 3 capsules by mouth in the morning, 2 capsules in the afternoon and 2 capsules at night 630 capsule 0   loratadine (CLARITIN) 10 MG tablet Take by mouth.     Multiple Vitamin (MULTIVITAMIN WITH MINERALS) TABS tablet Take 1 tablet by mouth daily.     ondansetron  (ZOFRAN -ODT) 4 MG disintegrating tablet Take 1 tablet by mouth every 8 (eight) hours as needed for nausea/vomiting.     oxybutynin  (DITROPAN ) 5 MG tablet TAKE 1 TABLET TWICE A DAY 180 tablet 0   topiramate (TOPAMAX) 50 MG tablet TAKE ONE TABLET BY MOUTH ONE TIME DAILY 90 tablet 0   colestipol  (COLESTID ) 1 g tablet Take 1 tablet (1 g total) by mouth 2 (two) times daily. Take 2-4 hours apart from all other medications 180 tablet 3   famotidine  (PEPCID ) 20 MG tablet Take 1 tablet (20 mg total) by mouth at bedtime. 90 tablet 3   fluticasone  (FLONASE ) 50 MCG/ACT nasal spray Place 1 spray into both nostrils daily as needed for  allergies or rhinitis.     pantoprazole  (PROTONIX ) 40 MG tablet Take 1 tablet (40 mg total) by mouth daily. 90 tablet 3   No current facility-administered medications for this visit.    Allergies as of 11/24/2024 - Review Complete 11/24/2024  Allergen Reaction Noted   Zolpidem Swelling 04/04/2020    Past Medical History:  Diagnosis Date   Anemia    Anxiety    Arthritis    Back pain    Bilateral swelling of feet    Chest pain    Chronic fatigue syndrome    COPD (chronic obstructive pulmonary disease) (HCC) 2017   Coronary artery disease    Ozell Kubas, PA   Depression    Dyspnea    with exertion   Falls    Fibromyalgia    GERD (gastroesophageal reflux disease)    Headache    High cholesterol    History of appendectomy 10/26/2022   History of fainting spells of unknown cause    ? 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   Prediabetes 10/06/2023   Rheumatoid arthritis (HCC)    Seasonal allergies    Sleep apnea    does not use cpap   SOB (shortness of breath)    Vitamin D  deficiency     Past Surgical History:  Procedure Laterality Date   BUNIONECTOMY Right 2002   CARDIAC CATHETERIZATION  01/23/2016   In [place]   COLONOSCOPY     x 2 at ages 36yr and 27yr - [place]   EXTERNAL FIXATION REMOVAL Right 08/06/2021   Procedure: REMOVAL EXTERNAL FIXATION LEG;  Surgeon: Kendal Franky SQUIBB, MD;  Location: [facility] OR;  Service: Orthopedics;  Laterality: Right;  zimmer biomet ex-fix removal   LAPAROSCOPIC GASTRIC SLEEVE RESECTION  2017   [place]   MULTIPLE TOOTH EXTRACTIONS     has upper and lower dentures   ORIF ANKLE FRACTURE Right 08/06/2021   Procedure: OPEN REDUCTION INTERNAL FIXATION (ORIF) ANKLE FRACTURE;  Surgeon: Kendal Franky SQUIBB, MD;  Location: [facility] OR;  Service: Orthopedics;  Laterality: Right;  smith and nephew   SYNDESMOSIS REPAIR Right 12/15/2021   Procedure: SYNDESMOSIS REPAIR;  Surgeon: Kendal Franky SQUIBB, MD;  Location: [facility] OR;  Service: Orthopedics;  Laterality: Right;    Review of Systems:    All systems reviewed and negative except where noted in HPI.    Physical Exam:  BP 114/80   Pulse 82   Ht 5' 3 (1.6 m)   Wt 243 lb 6 oz (110.4 kg)   SpO2 99%   BMI 43.11 kg/m  No LMP recorded. Patient is postmenopausal.  General: Well-nourished, well-developed, obese, in no acute distress.  Walks with a cane. Neuro: Alert and oriented x 3.  Grossly intact.  Psych: Alert and cooperative, normal mood and affect.   Imaging Studies: No results found.  Labs: CBC    Component Value Date/Time   WBC 10.5 09/01/2024 1152   RBC 4.63 09/01/2024 1152   HGB 13.4 09/01/2024 1152   HGB 13.4 08/31/2023 0929   HCT  42.1 09/01/2024 1152   HCT 42.0 08/31/2023 0929   PLT 200.0 09/01/2024 1152   PLT 182 08/31/2023 0929   MCV 90.8 09/01/2024 1152   MCV 90 08/31/2023 0929   MCH 28.7 08/31/2023 0929  MCH 28.5 12/15/2021 0559   MCHC 31.7 09/01/2024 1152   RDW 16.1 (H) 09/01/2024 1152   RDW 13.3 08/31/2023 0929   LYMPHSABS 0.9 09/01/2024 1152   LYMPHSABS 1.3 08/31/2023 0929   MONOABS 0.9 09/01/2024 1152   EOSABS 0.0 09/01/2024 1152   EOSABS 0.2 08/31/2023 0929   BASOSABS 0.0 09/01/2024 1152   BASOSABS 0.1 08/31/2023 0929    CMP     Component Value Date/Time   NA 138 09/01/2024 1152   NA 144 08/31/2023 0929   K 4.2 09/01/2024 1152   CL 103 09/01/2024 1152   CO2 27 09/01/2024 1152   GLUCOSE 117 (H) 09/01/2024 1152   BUN 13 09/01/2024 1152   BUN 11 08/31/2023 0929   CREATININE 1.02 09/01/2024 1152   CALCIUM  9.4 09/01/2024 1152   PROT 7.4 09/01/2024 1152   PROT 6.6 08/31/2023 0929   ALBUMIN 4.5 09/01/2024 1152   ALBUMIN 4.3 08/31/2023 0929   AST 25 09/01/2024 1152   ALT 23 09/01/2024 1152   ALKPHOS 68 09/01/2024 1152   BILITOT 0.7 09/01/2024 1152   BILITOT 0.3 08/31/2023 0929   GFRNONAA >60 12/15/2021 0559   GFRAA >60 04/03/2020 2209       Assessment and Plan:   Angela Mahoney is a 64 y.o. y/o female returns for follow-up of:  1.  Diarrhea appears to be improved compared to last visit.  Diarrhea has improved on Colestid .  Budesonide  did not help.  She still has some irregular bowel movements. 2.  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  GERD: Controlled on PPI and H2 RB  Colonoscopy showed nonspecific colitis.  Fecal calprotectin was elevated.  Repeat fecal calprotectin was normal.  Biopsies were negative for microscopic colitis or IBD.  Budesonide  did not help.  Colestid  does seem to be helping.  GI pathogen panel was negative for infections.  Celiac labs negative.  Fecal pancreatic elastase was normal.   Abdominal pelvic CT enterography unrevealing.    Plan: - Start Metamucil 2 tablespoons in a drink every day. -Drink 64 ounces of fluids daily. -High-fiber diet. - Low FODMAP diet handout given. -Avoid milk and dairy in case she has lactose intolerance.  Suggested Lactaid milk,  almond milk and Lactaid supplement. -Continue pantoprazole  40 mg once daily every morning. - Continue famotidine  20 Mg once daily nightly. - Refill Colestid  1g twice daily, #180, 3 refills.   Angela Console, PA-C  Follow up 6 months for IBS-D and GERD.   "

## 2024-11-24 ENCOUNTER — Encounter: Payer: Self-pay | Admitting: Physician Assistant

## 2024-11-24 ENCOUNTER — Ambulatory Visit: Admitting: Physician Assistant

## 2024-11-24 VITALS — BP 114/80 | HR 82 | Ht 63.0 in | Wt 243.4 lb

## 2024-11-24 DIAGNOSIS — K529 Noninfective gastroenteritis and colitis, unspecified: Secondary | ICD-10-CM | POA: Diagnosis not present

## 2024-11-24 DIAGNOSIS — K219 Gastro-esophageal reflux disease without esophagitis: Secondary | ICD-10-CM | POA: Diagnosis not present

## 2024-11-24 DIAGNOSIS — K580 Irritable bowel syndrome with diarrhea: Secondary | ICD-10-CM

## 2024-11-24 MED ORDER — FAMOTIDINE 20 MG PO TABS: 20.0000 mg | ORAL_TABLET | Freq: Every day | ORAL | 3 refills | Status: AC

## 2024-11-24 MED ORDER — PANTOPRAZOLE SODIUM 40 MG PO TBEC: 40.0000 mg | DELAYED_RELEASE_TABLET | Freq: Every day | ORAL | 3 refills | Status: AC

## 2024-11-24 MED ORDER — COLESTIPOL HCL 1 G PO TABS: 1.0000 g | ORAL_TABLET | Freq: Two times a day (BID) | ORAL | 3 refills | Status: AC

## 2024-11-24 NOTE — Patient Instructions (Addendum)
 Continue current mediations.   Follow up in 6 months.    Start Metamucil (Psyllium Husk) Fiber Powder: Metamucil powders: Put 2 rounded spoons in an empty glass ONCE Daily. If you're taking Metamucil Sugar-Free Powder or Premium Blend, use a teaspoon. If you're taking Metamucil with Real Sugar, use a tablespoon. Mix briskly with 8 oz. or more of cool liquid. Drink promptly, and enjoy! Drink 64 ounces of water or other liquids daily.

## 2024-11-24 NOTE — Progress Notes (Signed)
 Agree with assessment and plan as outlined.

## 2024-11-27 ENCOUNTER — Ambulatory Visit: Attending: Cardiovascular Disease | Admitting: Cardiovascular Disease

## 2024-11-27 ENCOUNTER — Encounter: Payer: Self-pay | Admitting: Cardiovascular Disease

## 2024-11-27 VITALS — BP 122/86 | HR 75 | Ht 63.0 in | Wt 244.6 lb

## 2024-11-27 DIAGNOSIS — I951 Orthostatic hypotension: Secondary | ICD-10-CM | POA: Diagnosis not present

## 2024-11-27 DIAGNOSIS — R42 Dizziness and giddiness: Secondary | ICD-10-CM

## 2024-11-27 DIAGNOSIS — R002 Palpitations: Secondary | ICD-10-CM

## 2024-11-27 DIAGNOSIS — R000 Tachycardia, unspecified: Secondary | ICD-10-CM | POA: Diagnosis not present

## 2024-11-27 MED ORDER — FLUDROCORTISONE ACETATE 0.1 MG PO TABS: 0.2000 mg | ORAL_TABLET | Freq: Every day | ORAL | 3 refills | Status: DC

## 2024-11-27 MED ORDER — FLUDROCORTISONE ACETATE 0.1 MG PO TABS: 0.2000 mg | ORAL_TABLET | Freq: Every day | ORAL | 3 refills | Status: AC

## 2024-11-27 NOTE — Patient Instructions (Signed)
 Medication Instructions:  Your physician recommends that you continue on your current medications as directed. Please refer to the Current Medication list given to you today.  *If you need a refill on your cardiac medications before your next appointment, please call your pharmacy*  Lab Work: None ordered If you have labs (blood work) drawn today and your tests are completely normal, you will receive your results only by: MyChart Message (if you have MyChart) OR A paper copy in the mail If you have any lab test that is abnormal or we need to change your treatment, we will call you to review the results.  Testing/Procedures: Echocardiogram  Follow-Up: At [facility], you and your health needs are our priority.  As part of our continuing mission to provide you with exceptional heart care, our providers are all part of one team.  This team includes your primary Cardiologist (physician) and Advanced Practice Providers or APPs (Physician Assistants and Nurse Practitioners) who all work together to provide you with the care you need, when you need it.  Your next appointment:   1 year(s)  Provider:   One of our Advanced Practice Providers (APPs): Morse Clause, PA-C  Lamarr Satterfield, NP Miriam Shams, NP  Olivia Pavy, PA-C Josefa Beauvais, NP  Leontine Salen, PA-C Orren Fabry, PA-C  Coffman Cove, PA-C Ernest Dick, NP  Damien Braver, NP Jon Hails, PA-C  Waddell Donath, PA-C    Dayna Dunn, PA-C  Scott Weaver, PA-C Lum Louis, NP Katlyn West, NP Callie Goodrich, PA-C  Xika Zhao, NP Sheng Haley, PA-C    Kathleen Johnson, PA-C       We recommend signing up for the patient portal called MyChart.  Sign up information is provided on this After Visit Summary.  MyChart is used to connect with patients for Virtual Visits (Telemedicine).  Patients are able to view lab/test results, encounter notes, upcoming appointments, etc.  Non-urgent messages can be sent to your provider as well.    To learn more about what you can do with MyChart, go to [email or web address].   Other Instructions Your physician has requested that you have an echocardiogram. Echocardiography is a painless test that uses sound waves to create images of your heart. It provides your doctor with information about the size and shape of your heart and how well your hearts chambers and valves are working. This procedure takes approximately one hour. There are no restrictions for this procedure. Please do NOT wear cologne, perfume, aftershave, or lotions (deodorant is allowed). Please arrive 15 minutes prior to your appointment time.  Please note: We ask at that you not bring children with you during ultrasound (echo/ vascular) testing. Due to room size and safety concerns, children are not allowed in the ultrasound rooms during exams. Our front office staff cannot provide observation of children in our lobby area while testing is being conducted. An adult accompanying a patient to their appointment will only be allowed in the ultrasound room at the discretion of the ultrasound technician under special circumstances. We apologize for any inconvenience.

## 2024-11-28 ENCOUNTER — Other Ambulatory Visit: Payer: Self-pay | Admitting: Family Medicine

## 2024-11-28 DIAGNOSIS — F419 Anxiety disorder, unspecified: Secondary | ICD-10-CM

## 2024-11-29 ENCOUNTER — Telehealth: Payer: Self-pay

## 2024-11-29 DIAGNOSIS — F419 Anxiety disorder, unspecified: Secondary | ICD-10-CM

## 2024-11-29 NOTE — Telephone Encounter (Signed)
 Copied from CRM [id number]. Topic: Clinical - Prescription Issue >> Nov 29, 2024  4:59 PM Wess RAMAN wrote: Reason for CRM: Patient states her refills were denied for gabapentin  (NEURONTIN ) 300 MG capsule and buPROPion  (WELLBUTRIN  XL) 150 MG 24 hr tablet. The pharmacy stated it was denied due to being too early to refill. Patient stated it is not too early.  Callback #: [id number]  Pharmacy: [facility] # [address] [place] - [address] [place] [address] Phone: [phone] Fax: [phone] Hours: Not open 24 hours

## 2024-11-30 ENCOUNTER — Telehealth: Payer: Self-pay | Admitting: Family Medicine

## 2024-11-30 MED ORDER — BUPROPION HCL ER (XL) 150 MG PO TB24: 150.0000 mg | ORAL_TABLET | ORAL | 0 refills | Status: AC

## 2024-11-30 MED ORDER — GABAPENTIN 300 MG PO CAPS: ORAL_CAPSULE | ORAL | 0 refills | Status: DC

## 2024-11-30 MED ORDER — BUPROPION HCL ER (XL) 150 MG PO TB24: 150.0000 mg | ORAL_TABLET | ORAL | 0 refills | Status: DC

## 2024-11-30 MED ORDER — GABAPENTIN 300 MG PO CAPS: ORAL_CAPSULE | ORAL | 0 refills | Status: AC

## 2024-11-30 NOTE — Telephone Encounter (Signed)
 Called [facility] pharmacy and confirmed they did not get rx. Resent rx to [facility]

## 2024-11-30 NOTE — Telephone Encounter (Unsigned)
 Copied from CRM [id number]. Topic: Clinical - Medication Refill >> Nov 30, 2024 11:47 AM Drema MATSU wrote: Medication:  gabapentin  (NEURONTIN ) 300 MG capsule and   Has the patient contacted their pharmacy? Yes (Agent: If no, request that the patient contact the pharmacy for the refill. If patient does not wish to contact the pharmacy document the reason why and proceed with request.) (Agent: If yes, when and what did the pharmacy advise?)  This is the patient's preferred pharmacy:  [facility] # [address] [place] - [address] [place] [address] Phone: [phone] Fax: [phone]  Is this the correct pharmacy for this prescription? Yes If no, delete pharmacy and type the correct one.   Has the prescription been filled recently? Yes  Is the patient out of the medication? No 3 days left of gabapentin   Has the patient been seen for an appointment in the last year OR does the patient have an upcoming appointment? Yes  Can we respond through MyChart? Yes  Agent: Please be advised that Rx refills may take up to 3 business days. We ask that you follow-up with your pharmacy.

## 2024-12-01 NOTE — Telephone Encounter (Signed)
 Copied from CRM [id number]. Topic: Clinical - Prescription Issue >> Dec 01, 2024 11:39 AM Myrick T wrote: Reason for CRM: Jereld from [facility] called stated they recv the script for buPROPion  (WELLBUTRIN  XL) 150 MG 24 hr tablet but can not see the number of refills and instructions. She also will need the providers address added as well. Please f/u with pharmacy

## 2024-12-01 NOTE — Telephone Encounter (Signed)
 Called pharmacy and was able to clarify rx instructions and vickie's practice address

## 2024-12-01 NOTE — Telephone Encounter (Signed)
 Copied from CRM [id number]. Topic: Clinical - Prescription Issue >> Dec 01, 2024 11:38 AM Zebedee SAUNDERS wrote: Reason for CRM: Pt stated pharmacy need gabapentin  (NEURONTIN ) 300 MG capsule sent electronically. Please send to: [facility] # [address] - [address] [place] [address] Phone: [phone] Fax: [phone], Pt will be out of medication by Sun. Jan. 18th.

## 2024-12-07 ENCOUNTER — Ambulatory Visit (INDEPENDENT_AMBULATORY_CARE_PROVIDER_SITE_OTHER): Admitting: Psychology

## 2024-12-07 ENCOUNTER — Ambulatory Visit: Admitting: Family Medicine

## 2024-12-07 DIAGNOSIS — F4010 Social phobia, unspecified: Secondary | ICD-10-CM | POA: Diagnosis not present

## 2024-12-07 DIAGNOSIS — F331 Major depressive disorder, recurrent, moderate: Secondary | ICD-10-CM | POA: Diagnosis not present

## 2024-12-07 NOTE — Progress Notes (Signed)
 "    [facility] Counselor/Therapist Progress Note  Patient ID: Angela Mahoney, MRN: [id number],    Date: 12/07/2024  Time Spent: 11:03am-11:48am   Pt is seen for a virtual video visit via caregility.  Pt joins from her home, reporting privacy, and counselor from her office.  Pt consents to virtual visits and is aware of limitations for such visits.    Treatment Type: Individual Therapy  Reported Symptoms: pt reports some anxiety w/ recent medical stress/decisions.   Mental Status Exam: Appearance:  Well Groomed     Behavior: Appropriate  Motor: Normal  Speech/Language:  Clear and Coherent  Affect: Appropriate  Mood: anxious  Thought process: normal  Thought content:   WNL  Sensory/Perceptual disturbances:   WNL  Orientation: oriented to person, place, time/date, and situation  Attention: Good  Concentration: Good  Memory: WNL  Fund of knowledge:  Good  Insight:   Good  Judgment:  Good  Impulse Control: Good   Risk Assessment: Danger to Self:  No Self-injurious Behavior: No Danger to Others: No Duty to Warn:no Physical Aggression / Violence:No  Access to Firearms a concern: No  Gang Involvement:No   Subjective: Counselor assessed pt current functioning per pt report.  Processed w/ pt positives, stressors and anxiety.  Explored recent medical stressors and recommendation for surgery.  Also reflected positives w/ new relationship established w/ cardiologist.  Discussed interactions w/ daughter and asserting her interest in joining.  Pt affect wnl.  Pt reports she wasn't able to come to office today as her gastro issues increased.  Pt reports she consulted w/ gastro and plan in place.  Pt reports some anxiety about decision for ortho surgery or not on ankle.  Pt discussed meeting w/her supports and finding out more about recovery before making decision.  Pt does acknowledge positive visit w/ new cardiologist and feels good about rapport built.  Pt reports she  did express interest to daughter about attending concert and daughter was vague and didn't agree or disagree.  Pt feels that won't happen and acknowledged she was able to assert and didn't cause conflict.    Interventions: Cognitive Behavioral Therapy, Assertiveness/Communication, and supportive  Diagnosis:Major depressive disorder, recurrent episode, moderate (HCC)  Social anxiety disorder  Plan: Pt to f/u in 2 week w/ counseling.  Pt to f/u as scheduled w/ PCP and specialist as scheduled.     ndividualized Treatment Plan Strengths: seeking counseling.  Pt enjoys crocheting.   Supports: husband and daughter   Goal/Needs for Treatment:  In order of importance to patient 1) coping w/ depression and anxiety 2) work on grief related to losses.   3) --   Client Statement of Needs: I need to work though stressors and losses- My daughter's stuff, my mom's stuff, my grief w/ my mom, Work on my stuff with him (husband) and losing him.   Treatment Level:outpatient counseling  Symptoms:depression, anxiety, worry, fatigue, grief, sleep disturbance  Client Treatment Preferences:Weekly to biweekly counseling.   Healthcare consumer's goal for treatment:  Counselor, Damien Herald, [facility] will support the patient's ability to achieve the goals identified. Cognitive Behavioral Therapy, Assertive Communication/Conflict Resolution Training, Relaxation Training, ACT, Humanistic and other evidenced-based practices will be used to promote progress towards healthy functioning.   Healthcare consumer will: Actively participate in therapy, working towards healthy functioning.    *Justification for Continuation/Discontinuation of Goal: R=Revised, O=Ongoing, A=Achieved, D=Discontinued  Goal 1) Manage depression and anxiety, reframing related thoughts, self care and behavioral management AEB pt report  of decreased symptoms and therapist observation.  Baseline date 03/16/24: Progress towards goal 0; How Often -  Daily Target Date Goal Was reviewed Status Code Progress towards goal/Likert rating  03/16/25                Goal 2) Increase effective coping w/ losses and grief through effective expression of feelings AEB pt verbalizing in session and therapist observation.  Baseline date 03/16/24: Progress towards goal 0/; How Often - Daily Target Date Goal Was reviewed Status Code Progress towards goal  03/16/25                 This plan has been reviewed and created by the following participants:  This plan will be reviewed at least every 12 months. Date Behavioral Health Clinician Date Guardian/Patient   03/16/24 [facility] 03/16/24 Verbal Consent Provided and signature on file            [place], [address] [facility] "

## 2024-12-19 ENCOUNTER — Encounter: Admitting: Physical Medicine & Rehabilitation

## 2024-12-21 ENCOUNTER — Ambulatory Visit: Admitting: Psychology

## 2024-12-21 DIAGNOSIS — F4010 Social phobia, unspecified: Secondary | ICD-10-CM

## 2024-12-21 DIAGNOSIS — F331 Major depressive disorder, recurrent, moderate: Secondary | ICD-10-CM
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
## 2024-12-26 ENCOUNTER — Ambulatory Visit (HOSPITAL_COMMUNITY)

## 2025-01-04 ENCOUNTER — Ambulatory Visit: Admitting: Psychology

## 2025-01-18 ENCOUNTER — Ambulatory Visit: Admitting: Psychology

## 2025-01-25 ENCOUNTER — Ambulatory Visit: Admitting: Family Medicine

## 2025-02-01 ENCOUNTER — Ambulatory Visit: Admitting: Psychology

## 2025-02-07 ENCOUNTER — Encounter: Admitting: Physical Medicine and Rehabilitation

## 2025-09-25 ENCOUNTER — Encounter: Admitting: Family Medicine

## 2025-09-25 ENCOUNTER — Ambulatory Visit
